# Patient Record
Sex: Female | Born: 1945
Health system: Southern US, Community
[De-identification: ages and names within clinical notes are randomized; demographics above are authoritative.]

## PROBLEM LIST (undated history)

## (undated) ENCOUNTER — Ambulatory Visit: Discharge status: Absent without leave | Payer: PPO

## (undated) DIAGNOSIS — M549 Dorsalgia, unspecified: Secondary | ICD-10-CM

## (undated) DIAGNOSIS — M797 Fibromyalgia: Secondary | ICD-10-CM

## (undated) DIAGNOSIS — J329 Chronic sinusitis, unspecified: Secondary | ICD-10-CM

## (undated) DIAGNOSIS — E785 Hyperlipidemia, unspecified: Secondary | ICD-10-CM

## (undated) DIAGNOSIS — F419 Anxiety disorder, unspecified: Secondary | ICD-10-CM

## (undated) DIAGNOSIS — G629 Polyneuropathy, unspecified: Secondary | ICD-10-CM

## (undated) DIAGNOSIS — E039 Hypothyroidism, unspecified: Secondary | ICD-10-CM

## (undated) DIAGNOSIS — I639 Cerebral infarction, unspecified: Secondary | ICD-10-CM

## (undated) HISTORY — DX: Hyperlipidemia, unspecified: E78.5

## (undated) HISTORY — PX: CATARACT EXTRACTION: SUR2

## (undated) HISTORY — PX: ABDOMINAL HYSTERECTOMY: SHX81

## (undated) HISTORY — PX: CHOLECYSTECTOMY: SHX55

## (undated) HISTORY — DX: Chronic sinusitis, unspecified: J32.9

## (undated) HISTORY — DX: Dorsalgia, unspecified: M54.9

## (undated) HISTORY — DX: Anxiety disorder, unspecified: F41.9

## (undated) HISTORY — DX: Fibromyalgia: M79.7

---

## 1999-09-28 ENCOUNTER — Encounter: Payer: Self-pay | Admitting: Obstetrics and Gynecology

## 2000-03-14 ENCOUNTER — Encounter: Payer: Self-pay | Admitting: Neurosurgery

## 2000-03-14 ENCOUNTER — Encounter: Admission: RE | Admit: 2000-03-14 | Discharge: 2000-03-14 | Payer: Self-pay | Admitting: Neurosurgery

## 2000-03-23 ENCOUNTER — Ambulatory Visit (HOSPITAL_COMMUNITY): Admission: RE | Admit: 2000-03-23 | Discharge: 2000-03-23 | Payer: Self-pay | Admitting: Neurosurgery

## 2000-03-24 ENCOUNTER — Ambulatory Visit (HOSPITAL_COMMUNITY): Admission: RE | Admit: 2000-03-24 | Discharge: 2000-03-24 | Payer: Self-pay | Admitting: Neurosurgery

## 2000-03-24 ENCOUNTER — Encounter: Payer: Self-pay | Admitting: Neurosurgery

## 2001-01-18 ENCOUNTER — Encounter: Payer: Self-pay | Admitting: Obstetrics and Gynecology

## 2001-01-18 ENCOUNTER — Encounter: Admission: RE | Admit: 2001-01-18 | Discharge: 2001-01-18 | Payer: Self-pay | Admitting: Obstetrics and Gynecology

## 2001-03-02 ENCOUNTER — Ambulatory Visit (HOSPITAL_COMMUNITY): Admission: RE | Admit: 2001-03-02 | Discharge: 2001-03-02 | Payer: Self-pay | Admitting: Preventative Medicine

## 2001-03-02 ENCOUNTER — Encounter: Payer: Self-pay | Admitting: Preventative Medicine

## 2001-05-15 ENCOUNTER — Encounter: Payer: Self-pay | Admitting: Urology

## 2001-05-15 ENCOUNTER — Encounter: Admission: RE | Admit: 2001-05-15 | Discharge: 2001-05-15 | Payer: Self-pay | Admitting: Urology

## 2001-05-29 ENCOUNTER — Encounter: Payer: Self-pay | Admitting: Urology

## 2001-05-29 ENCOUNTER — Encounter: Admission: RE | Admit: 2001-05-29 | Discharge: 2001-05-29 | Payer: Self-pay | Admitting: Urology

## 2001-05-30 ENCOUNTER — Encounter: Payer: Self-pay | Admitting: Urology

## 2001-06-01 ENCOUNTER — Ambulatory Visit (HOSPITAL_BASED_OUTPATIENT_CLINIC_OR_DEPARTMENT_OTHER): Admission: RE | Admit: 2001-06-01 | Discharge: 2001-06-01 | Payer: Self-pay | Admitting: Urology

## 2001-06-01 ENCOUNTER — Encounter (INDEPENDENT_AMBULATORY_CARE_PROVIDER_SITE_OTHER): Payer: Self-pay

## 2001-07-27 ENCOUNTER — Emergency Department (HOSPITAL_COMMUNITY): Admission: EM | Admit: 2001-07-27 | Discharge: 2001-07-28 | Payer: Self-pay | Admitting: Emergency Medicine

## 2002-02-27 ENCOUNTER — Ambulatory Visit (HOSPITAL_BASED_OUTPATIENT_CLINIC_OR_DEPARTMENT_OTHER): Admission: RE | Admit: 2002-02-27 | Discharge: 2002-02-27 | Payer: Self-pay | Admitting: Orthopedic Surgery

## 2002-11-16 ENCOUNTER — Ambulatory Visit (HOSPITAL_COMMUNITY): Admission: RE | Admit: 2002-11-16 | Discharge: 2002-11-16 | Payer: Self-pay | Admitting: General Surgery

## 2002-11-16 ENCOUNTER — Encounter: Payer: Self-pay | Admitting: General Surgery

## 2003-03-06 ENCOUNTER — Emergency Department (HOSPITAL_COMMUNITY): Admission: EM | Admit: 2003-03-06 | Discharge: 2003-03-07 | Payer: Self-pay | Admitting: Emergency Medicine

## 2003-03-06 ENCOUNTER — Encounter: Payer: Self-pay | Admitting: Emergency Medicine

## 2004-03-13 ENCOUNTER — Ambulatory Visit (HOSPITAL_COMMUNITY): Admission: RE | Admit: 2004-03-13 | Discharge: 2004-03-13 | Payer: Self-pay | Admitting: General Surgery

## 2004-04-07 ENCOUNTER — Observation Stay (HOSPITAL_COMMUNITY): Admission: RE | Admit: 2004-04-07 | Discharge: 2004-04-08 | Payer: Self-pay | Admitting: General Surgery

## 2004-07-09 ENCOUNTER — Ambulatory Visit (HOSPITAL_COMMUNITY): Admission: RE | Admit: 2004-07-09 | Discharge: 2004-07-09 | Payer: Self-pay | Admitting: General Surgery

## 2004-10-23 ENCOUNTER — Emergency Department (HOSPITAL_COMMUNITY): Admission: EM | Admit: 2004-10-23 | Discharge: 2004-10-24 | Payer: Self-pay | Admitting: Emergency Medicine

## 2005-07-14 ENCOUNTER — Ambulatory Visit (HOSPITAL_COMMUNITY): Admission: RE | Admit: 2005-07-14 | Discharge: 2005-07-14 | Payer: Self-pay | Admitting: General Surgery

## 2005-10-02 ENCOUNTER — Emergency Department (HOSPITAL_COMMUNITY): Admission: EM | Admit: 2005-10-02 | Discharge: 2005-10-02 | Payer: Self-pay | Admitting: Emergency Medicine

## 2005-10-11 ENCOUNTER — Ambulatory Visit (HOSPITAL_COMMUNITY): Admission: RE | Admit: 2005-10-11 | Discharge: 2005-10-11 | Payer: Self-pay | Admitting: General Surgery

## 2006-02-11 ENCOUNTER — Ambulatory Visit: Payer: Self-pay | Admitting: Internal Medicine

## 2006-02-21 ENCOUNTER — Ambulatory Visit: Payer: Self-pay | Admitting: Internal Medicine

## 2006-02-22 ENCOUNTER — Ambulatory Visit: Payer: Self-pay | Admitting: Internal Medicine

## 2006-02-23 ENCOUNTER — Ambulatory Visit: Payer: Self-pay | Admitting: Gastroenterology

## 2006-02-23 ENCOUNTER — Observation Stay (HOSPITAL_COMMUNITY): Admission: EM | Admit: 2006-02-23 | Discharge: 2006-02-26 | Payer: Self-pay | Admitting: Emergency Medicine

## 2006-02-28 ENCOUNTER — Ambulatory Visit: Payer: Self-pay | Admitting: Internal Medicine

## 2006-03-07 ENCOUNTER — Ambulatory Visit: Payer: Self-pay | Admitting: Internal Medicine

## 2006-03-11 ENCOUNTER — Ambulatory Visit: Payer: Self-pay | Admitting: Internal Medicine

## 2006-03-18 ENCOUNTER — Ambulatory Visit: Payer: Self-pay | Admitting: Internal Medicine

## 2006-04-01 ENCOUNTER — Ambulatory Visit: Payer: Self-pay | Admitting: Internal Medicine

## 2006-04-11 ENCOUNTER — Ambulatory Visit: Payer: Self-pay | Admitting: Internal Medicine

## 2007-03-09 ENCOUNTER — Ambulatory Visit: Payer: Self-pay | Admitting: Gastroenterology

## 2007-03-15 ENCOUNTER — Encounter (HOSPITAL_COMMUNITY): Admission: RE | Admit: 2007-03-15 | Discharge: 2007-04-14 | Payer: Self-pay | Admitting: Gastroenterology

## 2007-03-24 ENCOUNTER — Ambulatory Visit: Payer: Self-pay | Admitting: Gastroenterology

## 2007-03-24 ENCOUNTER — Ambulatory Visit (HOSPITAL_COMMUNITY): Admission: RE | Admit: 2007-03-24 | Discharge: 2007-03-24 | Payer: Self-pay | Admitting: Gastroenterology

## 2007-03-24 ENCOUNTER — Encounter: Payer: Self-pay | Admitting: Gastroenterology

## 2007-06-26 ENCOUNTER — Ambulatory Visit (HOSPITAL_COMMUNITY)
Admission: RE | Admit: 2007-06-26 | Discharge: 2007-06-26 | Payer: Self-pay | Admitting: Physical Medicine and Rehabilitation

## 2007-09-13 ENCOUNTER — Emergency Department (HOSPITAL_COMMUNITY): Admission: EM | Admit: 2007-09-13 | Discharge: 2007-09-13 | Payer: Self-pay | Admitting: Emergency Medicine

## 2007-11-16 ENCOUNTER — Ambulatory Visit (HOSPITAL_COMMUNITY): Payer: Self-pay | Admitting: Psychiatry

## 2007-12-04 ENCOUNTER — Ambulatory Visit (HOSPITAL_COMMUNITY): Payer: Self-pay | Admitting: Psychiatry

## 2007-12-12 ENCOUNTER — Ambulatory Visit (HOSPITAL_COMMUNITY): Payer: Self-pay | Admitting: Psychiatry

## 2007-12-21 ENCOUNTER — Ambulatory Visit (HOSPITAL_COMMUNITY): Payer: Self-pay | Admitting: Psychiatry

## 2008-01-23 ENCOUNTER — Ambulatory Visit (HOSPITAL_COMMUNITY): Payer: Self-pay | Admitting: Psychiatry

## 2008-02-06 ENCOUNTER — Ambulatory Visit (HOSPITAL_COMMUNITY): Payer: Self-pay | Admitting: Psychiatry

## 2008-03-07 ENCOUNTER — Ambulatory Visit (HOSPITAL_COMMUNITY): Payer: Self-pay | Admitting: Psychiatry

## 2008-04-25 ENCOUNTER — Ambulatory Visit (HOSPITAL_COMMUNITY): Payer: Self-pay | Admitting: Psychiatry

## 2008-06-25 ENCOUNTER — Ambulatory Visit (HOSPITAL_COMMUNITY): Payer: Self-pay | Admitting: Psychiatry

## 2008-08-20 ENCOUNTER — Ambulatory Visit (HOSPITAL_COMMUNITY): Payer: Self-pay | Admitting: Psychiatry

## 2008-09-03 ENCOUNTER — Ambulatory Visit (HOSPITAL_COMMUNITY): Payer: Self-pay | Admitting: Psychiatry

## 2008-10-11 ENCOUNTER — Ambulatory Visit (HOSPITAL_COMMUNITY): Payer: Self-pay | Admitting: Psychiatry

## 2008-11-12 ENCOUNTER — Ambulatory Visit (HOSPITAL_COMMUNITY): Payer: Self-pay | Admitting: Psychiatry

## 2009-01-07 ENCOUNTER — Ambulatory Visit (HOSPITAL_COMMUNITY): Payer: Self-pay | Admitting: Psychiatry

## 2009-04-22 ENCOUNTER — Ambulatory Visit (HOSPITAL_COMMUNITY): Payer: Self-pay | Admitting: Psychiatry

## 2009-08-12 ENCOUNTER — Ambulatory Visit (HOSPITAL_COMMUNITY): Payer: Self-pay | Admitting: Psychiatry

## 2009-11-13 ENCOUNTER — Ambulatory Visit (HOSPITAL_COMMUNITY): Payer: Self-pay | Admitting: Psychiatry

## 2010-02-06 ENCOUNTER — Ambulatory Visit (HOSPITAL_COMMUNITY)
Admission: RE | Admit: 2010-02-06 | Discharge: 2010-02-06 | Payer: Self-pay | Source: Home / Self Care | Admitting: Internal Medicine

## 2010-02-12 ENCOUNTER — Ambulatory Visit (HOSPITAL_COMMUNITY): Payer: Self-pay | Admitting: Psychiatry

## 2010-05-14 ENCOUNTER — Ambulatory Visit (HOSPITAL_COMMUNITY): Payer: Self-pay | Admitting: Psychiatry

## 2010-06-03 ENCOUNTER — Ambulatory Visit (HOSPITAL_COMMUNITY)
Admission: RE | Admit: 2010-06-03 | Discharge: 2010-06-03 | Payer: Self-pay | Source: Home / Self Care | Attending: Internal Medicine | Admitting: Internal Medicine

## 2010-06-05 ENCOUNTER — Ambulatory Visit (HOSPITAL_COMMUNITY)
Admission: RE | Admit: 2010-06-05 | Discharge: 2010-06-05 | Payer: Self-pay | Source: Home / Self Care | Attending: Internal Medicine | Admitting: Internal Medicine

## 2010-07-05 ENCOUNTER — Encounter: Payer: Self-pay | Admitting: Preventative Medicine

## 2010-08-06 ENCOUNTER — Encounter (INDEPENDENT_AMBULATORY_CARE_PROVIDER_SITE_OTHER): Payer: Medicare Other | Admitting: Psychiatry

## 2010-08-06 DIAGNOSIS — F411 Generalized anxiety disorder: Secondary | ICD-10-CM

## 2010-09-03 ENCOUNTER — Encounter (HOSPITAL_COMMUNITY): Payer: Medicare Other | Admitting: Psychiatry

## 2010-09-22 ENCOUNTER — Encounter (INDEPENDENT_AMBULATORY_CARE_PROVIDER_SITE_OTHER): Payer: Medicare Other | Admitting: Psychiatry

## 2010-09-22 DIAGNOSIS — F411 Generalized anxiety disorder: Secondary | ICD-10-CM

## 2010-10-07 ENCOUNTER — Other Ambulatory Visit (INDEPENDENT_AMBULATORY_CARE_PROVIDER_SITE_OTHER): Payer: Self-pay | Admitting: Otolaryngology

## 2010-10-07 DIAGNOSIS — R42 Dizziness and giddiness: Secondary | ICD-10-CM

## 2010-10-12 ENCOUNTER — Ambulatory Visit (HOSPITAL_COMMUNITY)
Admission: RE | Admit: 2010-10-12 | Discharge: 2010-10-12 | Disposition: A | Discharge status: Home / Self Care | Payer: Medicare Other | Source: Ambulatory Visit | Attending: Otolaryngology | Admitting: Otolaryngology

## 2010-10-12 DIAGNOSIS — R42 Dizziness and giddiness: Secondary | ICD-10-CM | POA: Insufficient documentation

## 2010-10-12 DIAGNOSIS — J328 Other chronic sinusitis: Secondary | ICD-10-CM | POA: Insufficient documentation

## 2010-10-12 MED ORDER — GADOBENATE DIMEGLUMINE 529 MG/ML IV SOLN
10.0000 mL | Freq: Once | INTRAVENOUS | Status: AC | PRN
  Administered 2010-10-12: 10 mL via INTRAVENOUS

## 2010-10-15 ENCOUNTER — Encounter (HOSPITAL_COMMUNITY): Payer: Self-pay | Admitting: Psychiatry

## 2010-10-20 ENCOUNTER — Encounter (INDEPENDENT_AMBULATORY_CARE_PROVIDER_SITE_OTHER): Payer: Medicare Other | Admitting: Psychiatry

## 2010-10-20 DIAGNOSIS — F411 Generalized anxiety disorder: Secondary | ICD-10-CM

## 2010-10-27 NOTE — Assessment & Plan Note (Signed)
NAMEMARLAYSIA, [name]                CHART#:  [id number]   DATE:  03/09/2007                       DOB:  11-Oct-1945   PROBLEM LIST:  1. Abdominal pain and diarrhea.  2. Fibromyalgia.  3. Migraines.  4. Cholecystectomy.  5. Appendectomy.  6. Hysterectomy.   SUBJECTIVE:  [name] is a 65 year old female who is seen as a return  patient visit.  She was seen as an inpatient consult in September 2007.  She has not been seen in clinic in the interval.  She complains of  cramping, bloating, and nausea.  She had these symptoms for 20 years,  but the nausea is new.  She denies any vomiting.  She does consume milk  products regularly, such as ice cream and milk on her cereal.  She began  to have heartburn every day a few weeks ago.  We questioned what did she  mean by heartburn, she means that food is bubbling up in her esophagus  for the last year.  She has had a 17 pound increase in weight in 2  months.  She is constipated secondary to her medications.  The morphine  was added approximately 3 months ago.  She tried to go without Reglan,  but Reglan works better for her nausea than Phenergan.  She denies any  blood in her stools.  No food has been coming out through her nose.  She  has 2 to 3 bowel movements a day.  It can be anywhere from watery to  soft and normal.  When she was having difficulty with her constipation,  she nearly passed out.  She has tried Levsin in the past without any  symptomatic relief.  She had an EGD over 5 years ago, and was unable to  get asleep.  She had a colonoscopy in 2001, which showed no polyps.   MEDICATIONS:  1. Tylenol as needed.  2. Aleve as needed.  3. MiraLax daily.  4. Crestor 10 mg daily.  5. Zolpidem 10 mg nightly.  6. Avinza 60 mg daily.  7. Premarin 0.3 mg daily.  8. Lorazepam 1 mg 3 times a day as needed.  9. Reglan 5 mg morning and evening.  10.Elavil 12.5 t.i.d.  11.Morphine 10 mg daily.   OBJECTIVE:  Weight 127 pounds.  Height 5 feet  3 inches.  Temperature  98.6.  Blood pressure 130/86.  Pulse 80.  GENERAL:  She is in no apparent distress.  Alert and oriented x4.  HEENT EXAM:  Atraumatic and normocephalic.  Pupils equal and reactive to  light.  MOUTH:  No oral lesions.  Posterior pharynx without erythema or exudate.  NECK:  Full range of motion with no lymphadenopathy.  LUNGS:  Clear to auscultation bilaterally.  CARDIOVASCULAR EXAM:  Regular rhythm.  No murmur.  Normal S1 and S2.  ABDOMEN:  Bowel sounds are present.  Soft.  Nontender.  Non-distended.  No rebound or guarding.  EXTREMITIES:  Without edema.  NEUROLOGIC:  She has no focal neurologic deficits.   ASSESSMENT:  [name] is a 65 year old female who has longstanding  history of irritable bowel syndrome causing abdominal pain and diarrhea.  She does consume lactose containing products.  Her symptoms may be  exacerbated by lactose intolerance.   Thank you for allowing me to see [name] in consultation.  My  recommendations follow.   RECOMMENDATIONS:  1. She is asked to follow a lactose free diet.  She was given her      discharge instructions in writing.  2. We will schedule a gastric emptying study as soon as possible to      evaluate bloating and nausea.  If her emptying is delayed, then she      will need to have her Reglan increased.  Her emptying could be      impaired by the addition of morphine.  3. If after 7 days she does not see any significant relief from      avoiding lactose products, she should also begin a gas and      flatulence prevention diet.  4. She is to add a probiotic until her next visit.  She was given      samples of Align.  She may substitute other probiotics.  5. She has a return patient visit to see me in 2 months.       Kassie Mends, M.D.  Electronically Signed     SM/MEDQ  D:  03/09/2007  T:  03/10/2007  Job:  [id number]   cc:   Catalina Pizza, M.D.

## 2010-10-27 NOTE — Op Note (Signed)
[name], [name]               ACCOUNT NO.:  [id number]   MEDICAL RECORD NO.:  [id number]          PATIENT TYPE:  AMB   LOCATION:  DAY                           FACILITY:  [facility]   PHYSICIAN:  Kassie Mends, M.D.      DATE OF BIRTH:  [birth year]   DATE OF PROCEDURE:  03/24/2007  DATE OF DISCHARGE:                               OPERATIVE REPORT   PROCEDURE:  Esophagogastroduodenoscopy with cold forceps biopsy.   INDICATION FOR EXAM:  [name] is a 65 year old female who has a history  of cramping, bloating, and nausea.  She had a gastric emptying study on  March 15, 2007 which showed delayed gastric emptying.  She was not  taking her Reglan at the time.  She is chronically maintained on  morphine.  Her upper endoscopy is being performed, today, to evaluate  new onset dyspepsia in a 65 year old female  who has delayed gastric  emptying.   FINDINGS:  1. Diffuse erythema of the antrum without erosion or ulceration.      Biopsies obtained via cold forceps to evaluate for H. pylori      gastritis.  2. Small hiatal hernia (1 cm).  3. Normal esophagus without evidence of Barrett's, stricture, mass or      erosion, or ulceration.  4. Normal duodenal bulb, and second portion of the duodenum.   RECOMMENDATIONS:  1. Will call [name] with the results of her biopsies.  She has no      evidence of H. pylori gastritis, then may consider increasing her      Reglan dose.  2. She should resume her Reglan at 5 mg in the morning and in the      evening.  3. She should avoid gastric irritants.  She is given a handout on      gastroparesis diet, gastric irritants, and gastritis.  4. No aspirin, NSAIDs, or anticoagulation for 5 days.  5. She should have a followup appointment to see me in November of      2008.   MEDICATIONS:  1. Demerol 100 mg IV.  2. Versed 6 mg IV.  3. Phenergan 25 mg IV.   PROCEDURE TECHNIQUE:  Physical exam was performed, and an informed  consent was obtained from the  patient after explaining the benefits,  risks, and alternatives to the procedure.  The patient was connected to  the monitor, and placed in the left lateral position.  Continuous oxygen  was provided by nasal cannula and IV medicine administered through an  indwelling cannula.  After administration of sedation, the patient's  esophagus was  intubated, and the scope was advanced under direct visualization to the  second portion of the duodenum.  The scope was removed slowly by  carefully examining the color, texture, anatomy, and integrity of the  mucosa on the way out.  The patient was recovered in endoscopy and  discharged home in satisfactory condition.      Kassie Mends, M.D.  Electronically Signed     SM/MEDQ  D:  03/24/2007  T:  03/25/2007  Job:  [id number]  cc:   Catalina Pizza, M.D.  Fax: [phone]

## 2010-10-29 ENCOUNTER — Ambulatory Visit (INDEPENDENT_AMBULATORY_CARE_PROVIDER_SITE_OTHER): Payer: Medicare Other | Admitting: Otolaryngology

## 2010-10-29 DIAGNOSIS — R42 Dizziness and giddiness: Secondary | ICD-10-CM

## 2010-10-29 DIAGNOSIS — J322 Chronic ethmoidal sinusitis: Secondary | ICD-10-CM

## 2010-10-29 DIAGNOSIS — H6980 Other specified disorders of Eustachian tube, unspecified ear: Secondary | ICD-10-CM

## 2010-10-29 DIAGNOSIS — J320 Chronic maxillary sinusitis: Secondary | ICD-10-CM

## 2010-10-30 NOTE — Op Note (Signed)
NAMEBASSHEVA, FLURY               ACCOUNT NO.:  [id number]   MEDICAL RECORD NO.:  [id number]          PATIENT TYPE:  AMB   LOCATION:  DAY                           FACILITY:  [facility]   PHYSICIAN:  Jerolyn Shin C. Katrinka Blazing, M.D.   DATE OF BIRTH:  [birth year]   DATE OF PROCEDURE:  04/07/2004  DATE OF DISCHARGE:                                 OPERATIVE REPORT   PREOPERATIVE DIAGNOSES:  Cholelithiasis, cholecystitis.   POSTOPERATIVE DIAGNOSES:  Cholelithiasis, cholecystitis.   PROCEDURE:  Laparoscopic cholecystectomy.   SURGEON:  Dirk Dress. Katrinka Blazing, M.D.   DESCRIPTION OF PROCEDURE:  Under general anesthesia, the patient's abdomen  was prepped and draped in a sterile field.  A supraumbilical incision was  made and Veress needle was inserted uneventfully.  Abdomen was insufflated  with 2 L of CO2.  Using a Visiport guide, a 10 mm port was placed  uneventfully.  Laparoscope was placed.  I tried to visualize the pelvis but  because of dense adhesions in the pelvis, the ovaries could not be seen.  The patient was positioned in reverse Trendelenburg position.  Under  videoscopic guidance, a 10 mm port and two 5 mm ports were placed in the  right subcostal region.  The gallbladder was grasped and positioned.  There  were two cystic artery branches.  Each was clipped with four clips and  divided.  Cystic duct was dissected back to the gallbladder.  It was clipped  with five clips and divided.  The gallbladder was separated from the  intrahepatic space with electrocautery without difficulty.  Gallbladder was  placed in an EndoCatch device and retrieved.  There was minimal blood loss  from the bed.  There was no bile leak.  Irrigation was carried out until the  fluid returned clear.  The bed was reinspected; no abnormality was noted.  The patient tolerated the procedure well.  CO2 was allowed to escape from  the abdomen, and the ports were removed.  The incision in the supraumbilical  midline was closed with 0  Dexon.  The skin incisions were closed with  staples.  The patient tolerated the procedure well.  Dressings were placed.  She was awakened from anesthesia uneventfully, transferred to a bed, and  taken to the postanesthesia care unit for further monitoring.     Lero   LCS/MEDQ  D:  04/07/2004  T:  04/07/2004  Job:  [id number]

## 2010-10-30 NOTE — Consult Note (Signed)
NAMEREI, CONTEE               ACCOUNT NO.:  [id number]   MEDICAL RECORD NO.:  [id number]          PATIENT TYPE:  INP   LOCATION:  A308                          FACILITY:  [facility]   PHYSICIAN:  Kassie Mends, M.D.      DATE OF BIRTH:  February 19, 1946   DATE OF CONSULTATION:  02/24/2006  DATE OF DISCHARGE:                                   CONSULTATION   REASON FOR CONSULTATION:  Abdominal pain and diarrhea.   HISTORY OF PRESENT ILLNESS:  [name] is a 65 year old female who has a  significant past medical history of irritable bowel syndrome.  She reports  episodic abdominal pain, vomiting, and diarrhea for the last 1 1/2 years.  In 2001, she reports having a colonoscopy by Dr. Katrinka Blazing for what she reports  what he believed was bloody stools.  She reports it was really vaginal  bleeding.  Her colonoscopy was normal.  She states 1 1/2 years ago, she had  the sudden onset of vomiting followed by slimy stool which required  hospitalization at [facility].  She was admitted, reports having  stool samples, x-rays, and no clear diagnosis was made.  She got a shot of  something and she got better.  She had the same thing approximately three  weeks ago and it lasted for 2-3 days.  She did not go to the hospital.  This  third time, she came to our emergency department because she was lying down  on Sunday, she got hot and cold, then had nausea and then the onset of  vomiting and diarrhea at the same time.  It was watery brown diarrhea which  was different than the first two episodes.  She initially had an episode of  emesis which was non-bloody and yellow.  She ate some Jello and after a  couple of hours, she had repeated episodes of vomiting and diarrhea and came  to the hospital.  She denies any blood in her stool.  She states her husband  has similar symptoms chronically.  She denies any fever.  She has subjective  chills.  She denies any dysuria or hematuria and has chronic back pain.  Now, her vomiting is resolved.  She still reports having a bowel movement  every 15 minutes.  At home, she goes at least 2-3 times a day.   PAST MEDICAL HISTORY:  1. Fibromyalgia.  2. Migraines.  3. Chronic diarrhea.  4. Cervical spine problems.   PAST SURGICAL HISTORY:  1. Hysterectomy.  2. Appendectomy.  3. Cholecystectomy for vomiting.   ALLERGIES:  CODEINE.   MEDICATIONS:  1. At home:      a.     Cymbalta.      b.     Lunesta.      c.     Reglan.  2. In hospital:      a.     Cymbalta.      b.     Lovenox.      c.     Reglan.      d.     Protonix.  e.     Desyrel.   FAMILY HISTORY:  She has no family history of colon cancer or colon polyps.   REVIEW OF SYSTEMS:  She denies any heartburn but complains of indigestion.  She describes her indigestion as drinking something and it comes back up if  she lies down.  She denies any difficulty swallowing and reports a 2 pound  weight loss since the acute onset of her symptoms.  Review of systems is per  the HPI, otherwise, all systems are negative.   PHYSICAL EXAMINATION:  VITAL SIGNS:  Temperature 98.9, systolic blood pressure 158-144, bowel  movement x3 recorded.  GENERAL:  She is in no apparent distress, alert and oriented x4.  HEENT:  Normocephalic, atraumatic, pupils equal and reactive to light, mouth  with no oral lesions.  Posterior pharynx without erythema or exudate.  NECK:  Full range of motion, no lymphadenopathy.  LUNGS:  Clear to auscultation bilaterally.  CARDIOVASCULAR EXAM:  Regular rhythm, no murmur, normal S1 and S2.  ABDOMEN:  Bowel sounds are present, soft, nontender, nondistended, no  rebound or guarding.  EXTREMITIES:  No cyanosis, clubbing, and edema.  NEUROLOGICAL:  She has no focal neurological deficits.   LABORATORY DATA:  White count 6.5, hemoglobin 11.1, platelets 156, pulse 69,  BUN 2, creatinine 0.6, B12 1376, albumin 3.9, ALT 25, ALT 26, total bili  0.5.  Pending C. diff stool culture,  O&P, and WBCs.   ASSESSMENT:  Ms. Focht is a 65 year old female with acute onset of vomiting  and diarrhea with a CT scan that shows small bowel enteritis, likely viral  and a low likelihood of bacterial enteritis.  She has chronic GI symptoms  likely secondary to a functional gut disorder.   Thank you for allowing me to see Ms. Kallen in consultation.  My  recommendations to follow.   RECOMMENDATIONS:  1. Discontinue Reglan in a patient who has a tendency to have depression.      I would use Phenergan and Zofran as needed for nausea and vomiting      unless she has a definite evidence of gastroparesis.  2. I would add Fibercon 2 pills twice a day and change to a lactose free      high fiber diet.  3. She may follow up with me one month post discharge.  4. Use Kaopectate after loose stools until the pending stool studies are      finalized.  5. Will reassess in the a.m.      Kassie Mends, M.D.  Electronically Signed    SM/MEDQ  D:  02/24/2006  T:  02/25/2006  Job:  [id number]

## 2010-10-30 NOTE — H&P (Signed)
NAMESHAILEE, [name]               ACCOUNT NO.:  [id number]   MEDICAL RECORD NO.:  [id number]          PATIENT TYPE:  AMB   LOCATION:  DAY                           FACILITY:  [facility]   PHYSICIAN:  Jerolyn Shin C. Katrinka Blazing, M.D.   DATE OF BIRTH:  [birth year]   DATE OF ADMISSION:  DATE OF DISCHARGE:  LH                                HISTORY & PHYSICAL   HISTORY OF PRESENT ILLNESS:  [name] is a 65 year old female with  history of recurrent abdominal pain and nausea on a daily basis, and  increasing bloating with worsening  postprandial discomfort.  She has had  episodes of nausea and vomiting.  She has been hospitalized in the past for  these symptoms.  She has been increasing symptomatic since August.  She has  had recurrent upper abdominal tenderness since that time.  She had an  ultrasound don, which showed sludge and small stones in the gallbladder.  The patient has clinical and ultrasonographic evidence of chronic  cholecystitis and is scheduled for laparoscopic cholecystectomy.   PAST HISTORY:  1.  The patient has chronic severe headaches.  2.  Chronic low-back pain.  3.  Depression.  4.  Fibromyalgia.   MEDICATIONS:  1.  Limbitrol D.S 1 PO  ___________ t.i.d.  2.  Wellbutrin 100 mg b.i.d.  3.  Esgic Plus 2 q.i.d.  4.  Premarin 1.25 mg daily.  5.  Hydrocodone 5/500 q.i.d.   PAST SURGICAL HISTORY:  Abdominoplasty.   SOCIAL HISTORY:  She is married.  Medically disabled.  She smokes 1.5 packs  of cigarettes per day.  She does not drink or use drugs.   REVIEW OF SYSTEMS:  Review of systems is positive for diffuse soft tissue  pain of her extremities and trunk.  She has chronic insomnia, chronic  headaches and worsening anxiety with depression.   PHYSICAL EXAMINATION:  GENERAL APPEARANCE:  On examination she is a small-  frame female in no acute distress.  VITAL SIGNS:  Blood pressure 100/60, pulse 88, respirations 18 and weight  114 pounds.  HEENT:  Unremarkable.  NECK:  The  neck is supple.  No JVD or bruits.  CHEST:  Chest is clear to auscultation.  HEART:  Regular rate and rhythm without murmur, gallop or rub.  ABDOMEN:  Mild epigastric and right upper quadrant tenderness.  No masses.  Normoactive bowel sounds.  EXTREMITIES:  Cyanosis, clubbing or edema.  NEUROLOGIC EXAMINATION:  No focal motor, sensory or cerebellar deficits.   IMPRESSION:  1.  Chronic cholecystitis with cholelithiasis.  2.  Chronic migraine headaches.  3.  Chronic low-back pain.  4.  Fibromyalgia.   PLAN:  Laparoscopic cholecystectomy.     Lero   LCS/MEDQ  D:  04/07/2004  T:  04/07/2004  Job:  [id number]

## 2010-10-30 NOTE — H&P (Signed)
[name], [name]               ACCOUNT NO.:  [id number]   MEDICAL RECORD NO.:  [id number]          PATIENT TYPE:  INP   LOCATION:  A308                          FACILITY:  [facility]   PHYSICIAN:  Osvaldo Shipper, MD     DATE OF BIRTH:  1946/02/20   DATE OF ADMISSION:  02/23/2006  DATE OF DISCHARGE:  09/15/2007LH                                HISTORY & PHYSICAL   PRIMARY CARE PHYSICIAN:  Dr. Harle Stanford.   ADMITTING DIAGNOSES:  1. Acute gastroenteritis.  2. Abdominal pain of unclear etiology.  3. Chronic diarrhea and chronic abdominal pain.  4. Possible irritable bowel syndrome.  5. History of arthritis.  6. History of migraine headaches.  7. History of cervical spine arthritis which is chronic.   CHIEF COMPLAINT:  Vomiting and diarrhea for the past 3 days.   HISTORY OF PRESENT ILLNESS:  The patient is a 65 year old Caucasian female  who has history of migraine headaches, arthritis, fibromyalgias, irritable  bowel syndrome, per the patient has history of diarrhea which dates back  more than 5 years ago.  The patient was on a lot of narcotic agents and she  is currently being detoxed, as she states, by her current PMD.  The patient  mentions that her recent episodes of vomiting and diarrhea started about 3  days ago.  She describes more than 10 bowel movements a day which is  yellowish in color, denies any blood in the stool.  The BM is really watery.  She has had about 2 since she has been here in the ED.  She also describes  multiple episodes of vomiting, more than 20 per day, with no blood in the  emesis.  Her emesis is described as yellowish and then becoming greenish.  She also complains of some abdominal pain which started after she started  throwing up and she describes it as more as a soreness rather than pain  itself.  She does have history of acid reflux and she is currently  experiencing mild regurgitation.  She states she has lost about 3-4 pounds  over the past few  months but her weight has been stable over the past few  years.  Denies any recent antibiotic use.  She said she has not been able to  take orally well for the past few days.  She also complains of insomnia for  the past 2 weeks.   Currently she is feeling a little bit better since she has been given some  anti-emetics in the ED.   MEDICATIONS AT HOME:  She said that she has been taken off of all of her  medications by her current PMD and she is only currently on Cymbalta and  Lunesta and Reglan.   ALLERGIES:  CODEINE.   PAST MEDICAL HISTORY:  Arthritis, fibromyalgia, migraine headaches,  irritable bowel syndrome, cervical spine problems as well dating back to a  long time, history of chronic diarrhea for the past year.  She said she had  an upper endoscopy done more than 10 years ago, and she said she had a  colonoscopy about  a year and a half ago at [facility].  She does not  know the results of the same.  The patient is status post hysterectomy,  cholecystectomy in the past.  She has had plastic surgery to her abdomen in  the past.  She also had a appendix removal along with her hysterectomy.   SOCIAL HISTORY:  Lives in [place], lives with her mother-in-law who is  bedridden and her husband who is on disability.  Smokes 1/2 pack of  cigarettes per day.  She is declining a nicotine patch at this time.  No  alcohol use, no illicit drug use.   FAMILY HISTORY:  Father died of non-Hodgkin's lymphoma, mother had diabetes  and a heart condition.  She has also passed away.  There is history of  diabetes among other family members as well.   REVIEW OF SYSTEMS:  Otherwise unremarkable except as mentioned in the HPI.   PHYSICAL EXAMINATION:  Vital signs: Temperature is 98.2, blood pressure  132/78, heart rate in the 90s, respiratory rate is 18.  Saturation is 100%  on room air.  NECK:  Soft and supple.  There is some tenderness with some range of motion  but there is no  instability that is appreciated.  LUNGS:  Clear to auscultation bilaterally.  CARDIOVASCULAR:  S1, S2, rate is regular, no murmurs appreciated.  ABDOMEN:  Reveals scars from previous surgery, otherwise it is quite soft.  There is mild tenderness diffusely with no rebound, rigidity or guarding.  Bowel sounds are present.  No mass or organomegaly appreciated.  Minimal  tenderness over the epigastric area where it is more than the other areas  EXTREMITIES:  Without edema.  Peripheral pulses are palpable.   LABORATORY DATA:  Her CBC reveals MCV of 101.8 with a normal hemoglobin,  platelet count is 196.  CMET reveals potassium of 3.2, glucose 147, other  values normal.  Lipase is normal.  Urinalysis shows small bilirubin,  ketones, small blood with no evidence of infection.   No imaging studies done.   IMPRESSION:  This is a 65 year old Caucasian female who has medical problems  as discussed earlier, who has chronic abdominal symptoms of diarrhea and  pain which has been evaluated in the past and no clear etiology has been  found.  Most likely this patient has irritable bowel syndrome causing most  of her problems.  However she is experiencing some increased emesis over the  past few days and as a result she has not been able to take p.o. intake.  The patient might have mild gastroenteritis resulting in the majority of her  symptoms.  She also has hypokalemia.  She also has abdominal pain which is  most likely related probably to gastritis component of the gastroenteritis.   PLAN:  1. GI.  We will observe the patient overnight, give her symptomatic      treatments with anti-emetics.  We will check stool studies and give her      Imodium for her diarrhea, continue Reglan for now.  Review of her      records suggests that she has not had imaging studies of her abdomen      done in a long time and we will obtain a CT scan of her abdomen and     pelvis to rule out any acute intra-abdominal  process.  We will start      her on clear liquids and advance as tolerated.  GI cocktail and PPI  shall be given.  GI consult will be considered if we really need one.      Otherwise, I think the patient will benefit from an outpatient GI      consultation.   1. Macrocytosis.  We will check a TSH and a B12 and folate level on this      individual.   1. Other medical problems including arthritis, migraines, fibromyalgia are      all stable.  I have told the patient emphatically that I am not      prescribing her any narcotic agents unless there is a clear indication      for the same.  The patient understands this completely.  She tells me      that she is currently being detoxed from these agents anyway.   1. DVT prophylaxis will be given.   I anticipate the patient going home in the next day once her acute symptoms  have been controlled and if her CT scan is unremarkable.  In the meantime we  will also try to obtain the report of her colonoscopy from [facility].      Osvaldo Shipper, MD  Electronically Signed     GK/MEDQ  D:  02/23/2006  T:  02/23/2006  Job:  [id number]   cc:   Valla Leaver, Ph.D.

## 2010-10-30 NOTE — Op Note (Signed)
NAME:  [name], Shalisha A                         ACCOUNT NO.:  [id number]   MEDICAL RECORD NO.:  [id number]                   PATIENT TYPE:  AMB   LOCATION:  DSC                                  FACILITY:  [facility]   PHYSICIAN:  Sherri Rad, M.D.               DATE OF BIRTH:  [birth year]   DATE OF PROCEDURE:  02/27/2002  DATE OF DISCHARGE:                                 OPERATIVE REPORT   PREOPERATIVE DIAGNOSIS:  Left dorsomedial great toe interphalangeal joint  degenerative cyst.   POSTOPERATIVE DIAGNOSIS:  Left dorsomedial great toe interphalangeal joint  degenerative cyst.   OPERATION:  Partial excision, left great toe proximal and distal phalanx.   ANESTHESIA:  General endotracheal tube.   SURGEON:  Sherri Rad, M.D.   ASSISTANT:  Shawna Orleans, PA   ESTIMATED BLOOD LOSS:  Minimal.   TOURNIQUET TIME:  Eighteen minutes.   COMPLICATIONS:  None.   DISPOSITION:  Stable to the PAR.   INDICATIONS FOR SURGERY:  This is a 65year-old female who has had  persistent dorsomedial left IP joint pain in the great toe for about three  years.  It has progressive; however, the size of the cyst has not changed.  It has been interfering with her life to the point where she cannot do what  she wants to do.  She has consented to the above procedure.  All risks  including infection, neurovascular injury, persistent pain, worsening of  pain, and recurrence were all explained.  Questions were encouraged and  answered.   DESCRIPTION OF OPERATION:  The patient was brought to the operating room and  placed in the supine position.  After adequate general endotracheal tube  anesthesia was administered as well as Ancef 1 gram IV piggyback the left  lower extremity was then prepped and draped in a sterile manner with a  proximally placed thigh tourniquet.  A longitudinal incision over the cyst  was then made.  Dissection was carried down to the cyst.  The soft tissues  were elevated  both proximally and distally.  Synovitis in the area was  debrided with a rongeur as well as the bone on either side of the IP joint  involving the proximal and distal phalanx on the dorsomedial corner of the  IP  joint.  Once this was done the wound was copiously irrigated with normal  saline.  The skin was closed with 4-0 nylon after tourniquet was deflated.  A sterile dressing was applied.   The patient was stable to the PAR with a hard sole shoe.                                                  Sherri Rad, M.D.    PAB/MEDQ  D:  02/27/2002  T:  02/27/2002  Job:  [id number]

## 2010-10-30 NOTE — Op Note (Signed)
[facility]  Patient:    [name], [name] Visit Number: [id number] MRN: [id number]          Service Type: NES Location: NESC Attending Physician:  Laqueta Jean Dictated by:   Vonzell Schlatter Patsi Sears, M.D. Proc. Date: 06/01/01 Admit Date:  06/01/2001                             Operative Report  PREOPERATIVE DIAGNOSIS:  Interstitial cystitis.  POSTOPERATIVE DIAGNOSIS:  Interstitial cystitis.  OPERATION:  Cystourethroscopy, urethral dilation, hydrodistention of the bladder, bladder biopsy, insertion of Pyridium and Marcaine, injection of Marcaine and Kenalog.  SURGEON:  Sigmund I. Patsi Sears, M.D.  ANESTHESIA:  General.  PREPARATION:  After appropriate preanesthesia, the patient was brought to the operating room and placed on the operating table in the dorsal supine position where general anesthesia was introduced.  She was then replaced in the low Allen stirrup dorsolithotomy position where the pubis was prepped with Betadine solution and draped in the usual fashion.  DESCRIPTION OF PROCEDURE:  The urethra was dilated to a size 36-French with the female urethral dilators.  Following this, cystoscopy was accomplished, and hydrodistention was accomplished from 350 cc to 400 cc.  Definite bleeding was noted from classic Hunner ulceration of interstitial cystitis, and these areas were biopsied.  Following this, the patient underwent insertion of Marcaine and Pyridium in the bladder, and injection of Marcaine and Kenalog via a long spinal needle, into the bladder base, and periurethral area.  Xylocaine jelly was placed in the urethra.  B&O suppository was given at the beginning of the case, and Toradol was given at the end of the procedure.  The patient tolerated the procedure well, was awakened, and taken to the recovery room in good condition. Dictated by:   Vonzell Schlatter Patsi Sears, M.D. Attending Physician:  Laqueta Jean DD:   06/01/01 TD:  06/02/01 Job: [id number]

## 2010-10-30 NOTE — Discharge Summary (Signed)
NAMETENAE, GRAZIOSI               ACCOUNT NO.:  [id number]   MEDICAL RECORD NO.:  [id number]          PATIENT TYPE:  INP   LOCATION:  A308                          FACILITY:  [facility]   PHYSICIAN:  Margaretmary Dys, M.D.DATE OF BIRTH:  [birth year]   DATE OF ADMISSION:  02/23/2006  DATE OF DISCHARGE:  09/15/2007LH                                 DISCHARGE SUMMARY   DISCHARGE DIAGNOSIS:  1. Acute gastroenteritis.  2. Acute abdominal pain likely related to number 1 above.  3. Chronic diarrhea and chronic abdominal pain.  4. Irritable bowel syndrome.  5. History of migraine headaches.   MEDICATIONS:  1. Cymbalta.  2. Lunesta.  3. Reglan as needed.   DISCHARGE INSTRUCTIONS:  Diet regular.  Activity is as tolerated.   CONSULTATIONS:  GI consultation with Dr. Kassie Mends who recommended lactose  free high fiber diet and to follow up with her in one month.   FOLLOW UP:  1. Dr. Kassie Mends in one month.  2. Primary care physician in 3-4 weeks.   PERTINENT LABORATORY DATA:  On admission, white blood cell count normal with  MCV 101.8, normal hemoglobin, platelet count 196.  Potassium 3.2, glucose  147, otherwise normal.  Lipase normal.   HOSPITAL COURSE:  Ms. Jernberg is a 65 year old Caucasian female who has a  history of migraine headaches, arthritis, fibromyalgias, irritable bowel  syndrome and intermittent diarrhea over the past five years.  She has been  on narcotic agents in the past and has been detox'd.  The patient reports  having several episodes of vomiting and diarrhea three days prior to  admission.  She describes having 10 bowel movements a day which were  yellowish in color.  She denies any blood.  She denies any fevers or chills.  She has also had multiple episodes of vomiting, in excess of 20, which she  describes as yellowish and then greenish.  She does have a history of acid  reflux and was having some regurgitation problems.  She had extensive  evaluation including  stool studies during the course of hospitalization.  She was hydrated with fluids.  However, as we proceeded with her  hospitalization, it appeared that it was entirely due to gastroenteritis.  A  CT scan of the abdomen and pelvis suggested evidence of enteritis.  The  patient was subsequently managed conservatively and when I saw her on  February 26, 2006, she was doing much better and was subsequently  discharged home in satisfactory condition.   DISPOSITION:  To home.      Margaretmary Dys, M.D.  Electronically Signed     AM/MEDQ  D:  03/03/2006  T:  03/03/2006  Job:  [id number]   cc:   Valla Leaver, Ph.D.

## 2010-11-03 ENCOUNTER — Encounter (HOSPITAL_COMMUNITY): Payer: Medicare Other | Admitting: Psychiatry

## 2010-12-08 ENCOUNTER — Encounter (INDEPENDENT_AMBULATORY_CARE_PROVIDER_SITE_OTHER): Payer: No Typology Code available for payment source | Admitting: Psychiatry

## 2010-12-08 DIAGNOSIS — F411 Generalized anxiety disorder: Secondary | ICD-10-CM

## 2011-01-07 ENCOUNTER — Encounter (INDEPENDENT_AMBULATORY_CARE_PROVIDER_SITE_OTHER): Payer: No Typology Code available for payment source | Admitting: Psychiatry

## 2011-01-07 DIAGNOSIS — F411 Generalized anxiety disorder: Secondary | ICD-10-CM

## 2011-01-21 ENCOUNTER — Encounter (INDEPENDENT_AMBULATORY_CARE_PROVIDER_SITE_OTHER): Payer: No Typology Code available for payment source | Admitting: Psychiatry

## 2011-01-21 DIAGNOSIS — F411 Generalized anxiety disorder: Secondary | ICD-10-CM

## 2011-01-28 ENCOUNTER — Encounter (HOSPITAL_COMMUNITY): Payer: No Typology Code available for payment source | Admitting: Psychiatry

## 2011-02-02 ENCOUNTER — Encounter (HOSPITAL_COMMUNITY): Payer: No Typology Code available for payment source | Admitting: Psychiatry

## 2011-02-18 ENCOUNTER — Ambulatory Visit (HOSPITAL_COMMUNITY)
Admission: RE | Admit: 2011-02-18 | Discharge: 2011-02-18 | Disposition: A | Discharge status: Home / Self Care | Payer: Medicare Other | Source: Ambulatory Visit | Attending: Internal Medicine | Admitting: Internal Medicine

## 2011-02-18 ENCOUNTER — Other Ambulatory Visit (HOSPITAL_COMMUNITY): Payer: Self-pay | Admitting: Internal Medicine

## 2011-02-18 DIAGNOSIS — R05 Cough: Secondary | ICD-10-CM

## 2011-02-18 DIAGNOSIS — R059 Cough, unspecified: Secondary | ICD-10-CM | POA: Insufficient documentation

## 2011-02-18 DIAGNOSIS — R0602 Shortness of breath: Secondary | ICD-10-CM | POA: Insufficient documentation

## 2011-02-25 ENCOUNTER — Ambulatory Visit (INDEPENDENT_AMBULATORY_CARE_PROVIDER_SITE_OTHER): Payer: No Typology Code available for payment source | Admitting: Psychiatry

## 2011-02-25 DIAGNOSIS — F411 Generalized anxiety disorder: Secondary | ICD-10-CM

## 2011-03-09 LAB — BASIC METABOLIC PANEL
BUN: 11
CO2: 21
Creatinine, Ser: 0.88
GFR calc Af Amer: >60
GFR calc non Af Amer: >60
Potassium: 3.2 — ABNORMAL LOW
Sodium: 138

## 2011-03-09 LAB — URINALYSIS, ROUTINE W REFLEX MICROSCOPIC
Bilirubin Urine: NEGATIVE
Glucose, UA: NEGATIVE
Protein, ur: NEGATIVE
Specific Gravity, Urine: 1.025
Urobilinogen, UA: 0.2

## 2011-03-09 LAB — URINE MICROSCOPIC-ADD ON

## 2011-03-09 LAB — RAPID URINE DRUG SCREEN, HOSP PERFORMED
Barbiturates: NOT DETECTED
Benzodiazepines: POSITIVE — AB

## 2011-03-09 LAB — DIFFERENTIAL
Basophils Absolute: 0.1
Eosinophils Absolute: 0
Lymphocytes Relative: 19
Neutrophils Relative %: 73

## 2011-03-09 LAB — CBC
HCT: 42.2
Platelets: 347
RBC: 4.32
RDW: 12.7

## 2011-03-25 ENCOUNTER — Encounter (INDEPENDENT_AMBULATORY_CARE_PROVIDER_SITE_OTHER): Payer: No Typology Code available for payment source | Admitting: Psychiatry

## 2011-03-25 DIAGNOSIS — F411 Generalized anxiety disorder: Secondary | ICD-10-CM

## 2011-04-15 ENCOUNTER — Encounter (INDEPENDENT_AMBULATORY_CARE_PROVIDER_SITE_OTHER): Payer: No Typology Code available for payment source | Admitting: Psychiatry

## 2011-04-15 DIAGNOSIS — F411 Generalized anxiety disorder: Secondary | ICD-10-CM

## 2011-05-13 ENCOUNTER — Encounter (HOSPITAL_COMMUNITY): Payer: No Typology Code available for payment source | Admitting: Psychiatry

## 2011-05-13 ENCOUNTER — Ambulatory Visit (INDEPENDENT_AMBULATORY_CARE_PROVIDER_SITE_OTHER): Payer: No Typology Code available for payment source | Admitting: Psychiatry

## 2011-05-13 ENCOUNTER — Encounter (HOSPITAL_COMMUNITY): Payer: Self-pay | Admitting: Psychiatry

## 2011-05-13 DIAGNOSIS — F411 Generalized anxiety disorder: Secondary | ICD-10-CM

## 2011-05-13 DIAGNOSIS — F419 Anxiety disorder, unspecified: Secondary | ICD-10-CM

## 2011-05-13 MED ORDER — ZOLPIDEM TARTRATE 10 MG PO TABS: 10.0000 mg | ORAL_TABLET | Freq: Every evening | ORAL | Status: DC | PRN

## 2011-05-13 MED ORDER — SERTRALINE HCL 100 MG PO TABS: 100.0000 mg | ORAL_TABLET | Freq: Every day | ORAL | Status: DC

## 2011-05-13 MED ORDER — VENLAFAXINE HCL 75 MG PO TABS: 75.0000 mg | ORAL_TABLET | Freq: Two times a day (BID) | ORAL | Status: DC

## 2011-05-13 NOTE — Progress Notes (Signed)
Patient came for her followup appointment with her husband. She's been doing much better with the Effexor. She is taking now Effexor 75 mg twice a day and Zoloft 100 mg daily. She reported no severe panic attack or anxiety attack in past few weeks. She had a good Thanksgiving and she is looking forward to have a good Christmas with her sister and their children. Husband also endorse much improvement with Effexor. She reported increased energy increased concentration and has been more involved in her daily life. 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 She feels great when she help that lady. Overall she has been compliant in doing very well on current medication. She reported no side effects of medication. She denies any agitation anger or mood swings. Her sleep is better. And her affect is improved.  Mental status emanation Patient is well groomed and well dressed. She maintained good eye contact. Her speech is soft clear and coherent. She described her mood is good and her affect is improved. She denies any active or passive suicidal thinking or homicidal thinking. She denies any auditory or visual hallucination. There no psychotic symptoms present. She's alert and oriented x3. Her insight judgment and pulse control okay.  Assessment Anxiety disorder NOS rule out Maj. depressive disorder  Plan We will continue her medications Zoloft 100 mg daily, Effexor 75 mg twice a day and Ambien 10 mg at bedtime. I have explained risks and benefits of medication and recommended to call us if she has any question. I will see her again in 2 months

## 2011-05-24 ENCOUNTER — Other Ambulatory Visit (HOSPITAL_COMMUNITY): Payer: Self-pay | Admitting: Psychiatry

## 2011-06-11 ENCOUNTER — Other Ambulatory Visit (HOSPITAL_COMMUNITY): Payer: Self-pay | Admitting: *Deleted

## 2011-06-14 ENCOUNTER — Other Ambulatory Visit (HOSPITAL_COMMUNITY): Payer: Self-pay | Admitting: *Deleted

## 2011-06-14 ENCOUNTER — Other Ambulatory Visit (HOSPITAL_COMMUNITY): Payer: Self-pay | Admitting: Psychiatry

## 2011-06-14 MED ORDER — ZOLPIDEM TARTRATE 10 MG PO TABS: 10.0000 mg | ORAL_TABLET | Freq: Every evening | ORAL | Status: DC | PRN

## 2011-06-14 MED ORDER — CLONAZEPAM 1 MG PO TABS: 1.0000 mg | ORAL_TABLET | Freq: Three times a day (TID) | ORAL | Status: DC

## 2011-06-16 NOTE — Telephone Encounter (Signed)
Ambien and Klonopin called to preferred pharmacy 06/14/11 by Dr. Lucianne Muss.

## 2011-07-13 ENCOUNTER — Ambulatory Visit (INDEPENDENT_AMBULATORY_CARE_PROVIDER_SITE_OTHER): Payer: No Typology Code available for payment source | Admitting: Psychiatry

## 2011-07-13 DIAGNOSIS — F419 Anxiety disorder, unspecified: Secondary | ICD-10-CM

## 2011-07-13 DIAGNOSIS — F411 Generalized anxiety disorder: Secondary | ICD-10-CM

## 2011-07-13 DIAGNOSIS — Z79899 Other long term (current) drug therapy: Secondary | ICD-10-CM

## 2011-07-13 LAB — COMPREHENSIVE METABOLIC PANEL
AST: 12 U/L (ref 0–37)
Albumin: 4.4 g/dL (ref 3.5–5.2)
Alkaline Phosphatase: 51 U/L (ref 39–117)
BUN: 10 mg/dL (ref 6–23)
Calcium: 9.5 mg/dL (ref 8.4–10.5)
Chloride: 105 mEq/L (ref 96–112)
Glucose, Bld: 69 mg/dL — ABNORMAL LOW (ref 70–99)
Potassium: 4.2 mEq/L (ref 3.5–5.3)
Sodium: 140 mEq/L (ref 135–145)
Total Protein: 6.7 g/dL (ref 6.0–8.3)

## 2011-07-13 LAB — CBC WITH DIFFERENTIAL/PLATELET
Basophils Absolute: 0.1 10*3/uL (ref 0.0–0.1)
Basophils Relative: 1 % (ref 0–1)
Eosinophils Absolute: 0.1 10*3/uL (ref 0.0–0.7)
Hemoglobin: 12.6 g/dL (ref 12.0–15.0)
MCH: 32.3 pg (ref 26.0–34.0)
MCHC: 33.1 g/dL (ref 30.0–36.0)
Monocytes Relative: 12 % (ref 3–12)
Neutro Abs: 3.6 10*3/uL (ref 1.7–7.7)
Neutrophils Relative %: 56 % (ref 43–77)
Platelets: 266 10*3/uL (ref 150–400)

## 2011-07-13 LAB — HEMOGLOBIN A1C: Hgb A1c MFr Bld: 5.7 % — ABNORMAL HIGH (ref <5.7)

## 2011-07-13 MED ORDER — ZOLPIDEM TARTRATE 10 MG PO TABS: 10.0000 mg | ORAL_TABLET | Freq: Every evening | ORAL | Status: DC | PRN

## 2011-07-13 MED ORDER — CLONAZEPAM 1 MG PO TABS: 1.0000 mg | ORAL_TABLET | Freq: Three times a day (TID) | ORAL | Status: DC

## 2011-07-13 MED ORDER — VENLAFAXINE HCL 75 MG PO TABS: 75.0000 mg | ORAL_TABLET | Freq: Two times a day (BID) | ORAL | Status: DC

## 2011-07-13 MED ORDER — SERTRALINE HCL 100 MG PO TABS: 100.0000 mg | ORAL_TABLET | Freq: Every day | ORAL | Status: DC

## 2011-07-13 NOTE — Progress Notes (Signed)
Patient came for her followup appointment with her husband. She continues to have episodic nervousness and panic attack but overall her anxiety is well controlled. She does not want to to stop Zoloft as she feels combination of Zoloft and Effexor working very well. She still takes Klonopin 3 times a day and Ambien at bedtime. She had a good holidays as she was able to meet her sister. She denies any agitation anger or mood swings. She reported no side effects of medication. She is sleeping fine and there has been no change in her appetite or weight.  Mental status emanation Patient is well groomed and well dressed. She maintained good eye contact. Her speech is soft clear and coherent. Her thought process is logical linear and goal-directed. She described her mood is good and her affect is improved. She denies any active or passive suicidal thinking or homicidal thinking. She denies any auditory or visual hallucination. There no psychotic symptoms present. She's alert and oriented x3. Her insight judgment and pulse control okay.  Assessment Anxiety disorder NOS rule out Maj. depressive disorder  Plan We will continue her medications Zoloft 100 mg daily, she is reporting no side effects of medication. I will also continue Effexor 75 mg twice a day and Ambien 10 mg at bedtime. I will get blood work including CBC CMP and hemoglobin A1c. I have explained risks and benefits of medication and recommended to call us if she has any question. I will see her again in 2 months

## 2011-09-09 ENCOUNTER — Ambulatory Visit (HOSPITAL_COMMUNITY): Payer: No Typology Code available for payment source | Admitting: Psychiatry

## 2011-09-09 ENCOUNTER — Other Ambulatory Visit (HOSPITAL_COMMUNITY): Payer: Self-pay | Admitting: *Deleted

## 2011-09-09 DIAGNOSIS — F419 Anxiety disorder, unspecified: Secondary | ICD-10-CM

## 2011-09-09 MED ORDER — SERTRALINE HCL 100 MG PO TABS: 100.0000 mg | ORAL_TABLET | Freq: Every day | ORAL | Status: DC

## 2011-09-09 MED ORDER — ZOLPIDEM TARTRATE 10 MG PO TABS: 10.0000 mg | ORAL_TABLET | Freq: Every evening | ORAL | Status: DC | PRN

## 2011-09-09 MED ORDER — CLONAZEPAM 1 MG PO TABS: 1.0000 mg | ORAL_TABLET | Freq: Three times a day (TID) | ORAL | Status: DC

## 2011-09-09 MED ORDER — VENLAFAXINE HCL 75 MG PO TABS: 75.0000 mg | ORAL_TABLET | Freq: Two times a day (BID) | ORAL | Status: DC

## 2011-10-05 ENCOUNTER — Encounter (HOSPITAL_COMMUNITY): Payer: Self-pay | Admitting: Psychiatry

## 2011-10-05 ENCOUNTER — Ambulatory Visit (INDEPENDENT_AMBULATORY_CARE_PROVIDER_SITE_OTHER): Payer: No Typology Code available for payment source | Admitting: Psychiatry

## 2011-10-05 VITALS — Wt 130.0 lb

## 2011-10-05 DIAGNOSIS — F32A Depression, unspecified: Secondary | ICD-10-CM | POA: Insufficient documentation

## 2011-10-05 DIAGNOSIS — F419 Anxiety disorder, unspecified: Secondary | ICD-10-CM | POA: Insufficient documentation

## 2011-10-05 DIAGNOSIS — F411 Generalized anxiety disorder: Secondary | ICD-10-CM

## 2011-10-05 MED ORDER — SERTRALINE HCL 100 MG PO TABS: 100.0000 mg | ORAL_TABLET | Freq: Every day | ORAL | Status: DC

## 2011-10-05 MED ORDER — CLONAZEPAM 1 MG PO TABS: 1.0000 mg | ORAL_TABLET | Freq: Three times a day (TID) | ORAL | Status: DC

## 2011-10-05 MED ORDER — VENLAFAXINE HCL 75 MG PO TABS: 75.0000 mg | ORAL_TABLET | Freq: Two times a day (BID) | ORAL | Status: DC

## 2011-10-05 NOTE — Progress Notes (Addendum)
Chief complaint I'm very anxious because my pain doctor told me that he will cut down my pain medicine.    History of present illness Patient is 66 year old married unemployed female who came with her husband for followup appointment.  She is very concerned and anxious as her pain doctor told that he will cut down oxycodone and next visit and eventually it will be discontinued.  Patient is unclear pain doctor will replace with other pain medication.  She is very anxious and nervous.  She does not have any other issues however she feel that she cannot deal her pain without medication.  She does not go outside due to anxiety and pain.  She is taking Effexor Zoloft and Klonopin as prescribed.  She likes these medication.  She denies any active or passive suicidal thinking or homicidal thinking.  She does not abuse her Klonopin or ask for early refill.  She does not drink alcohol or any illegal substance.  She denies any agitation anger or mood swings.  Her husband endorse that overall her depression is much control with the medication.  She denies any shakes tremors or any side effects.  I reviewed her blood test results which was done on her last visit.  She has normal liver enzymes and kidney function test.  Her hemoglobin A1c is 5.7.  Current psychiatric medication Effexor 75 mg twice a day Zoloft 100 mg daily Klonopin 1 mg 3 times a day and forth as needed  She also takes Ambien prescribed by her primary care physician.  Medical history Patient has history of hyperlipidemia, sinusitis, seasonal allergies, chronic back pain and fibromyalgia.  She sees Dr. Modesta Messing who prescribed pain medication .  She takes Flexeril, oxycodone from him.  Social history Patient is unemployed.  She lives with her husband.  She is very attached to her daughter.  Mental status emanation Patient is well groomed and well dressed. She maintained good eye contact.  She is calm pleasant and cooperative.  Her speech is soft  clear and coherent. Her thought process is logical linear and goal-directed. She described her mood is anxious and her affect is mood congruent.  She denies any active or passive suicidal thinking or homicidal thinking. She denies any auditory or visual hallucination. There no psychotic symptoms present. She's alert and oriented x3. Her insight judgment and pulse control okay.  Assessment Axis I Anxiety disorder NOS  Axis II deferred Axis III see medical history Axis IV mild to moderate  Axis V 60-65  Plan I discussed in detail about her management of pain .  I told her to discuss with her pain doctor in detail as stopping control medication may cause withdrawal symptoms.  Patient has history of significant withdrawal symptoms when she was taking benzodiazepine few years ago.  I encourage her to talk to her pain doctor about switching to not control pain medication.  At this time patient is tolerating her psychiatric medication without any side effects.  Lab results discussed in detail with the patient.  Encouraged her to monitor her diet , calorie intake and try to do exercise .  I recommended to call us if she is a question or concern about the medication or if she feels worsening of her symptoms.  I will see her again in 8 weeks. Time spent 30 minutes

## 2011-10-07 ENCOUNTER — Other Ambulatory Visit (HOSPITAL_COMMUNITY): Payer: Self-pay | Admitting: Psychiatry

## 2011-10-07 ENCOUNTER — Telehealth (HOSPITAL_COMMUNITY): Payer: Self-pay | Admitting: *Deleted

## 2011-10-09 ENCOUNTER — Other Ambulatory Visit (HOSPITAL_COMMUNITY): Payer: Self-pay | Admitting: Psychiatry

## 2011-10-10 ENCOUNTER — Other Ambulatory Visit (HOSPITAL_COMMUNITY): Payer: Self-pay | Admitting: Psychiatry

## 2011-10-10 NOTE — Telephone Encounter (Signed)
Given hard copy on 10/05/11

## 2011-10-12 ENCOUNTER — Other Ambulatory Visit (HOSPITAL_COMMUNITY): Payer: Self-pay | Admitting: *Deleted

## 2011-10-12 DIAGNOSIS — F419 Anxiety disorder, unspecified: Secondary | ICD-10-CM

## 2011-10-12 MED ORDER — ZOLPIDEM TARTRATE 10 MG PO TABS: 10.0000 mg | ORAL_TABLET | Freq: Every evening | ORAL | Status: DC | PRN

## 2011-11-09 ENCOUNTER — Other Ambulatory Visit (HOSPITAL_COMMUNITY): Payer: Self-pay | Admitting: Psychiatry

## 2011-11-09 ENCOUNTER — Telehealth (HOSPITAL_COMMUNITY): Payer: Self-pay | Admitting: *Deleted

## 2011-11-09 NOTE — Telephone Encounter (Signed)
Never prescribed by this office. Contact primary care physcian

## 2011-11-09 NOTE — Telephone Encounter (Signed)
Patient called complaining of pain and infection.  Her pain Dr. has cut down the pain medication.  She is having withdrawal symptoms.  I recommend to contact her primary care physician and pain management to address this issue.

## 2011-11-15 ENCOUNTER — Other Ambulatory Visit (HOSPITAL_COMMUNITY): Payer: Self-pay | Admitting: Psychiatry

## 2011-11-15 DIAGNOSIS — F411 Generalized anxiety disorder: Secondary | ICD-10-CM

## 2011-11-29 ENCOUNTER — Other Ambulatory Visit: Payer: Self-pay | Admitting: Neurosurgery

## 2011-11-29 ENCOUNTER — Ambulatory Visit
Admission: RE | Admit: 2011-11-29 | Discharge: 2011-11-29 | Disposition: A | Discharge status: Home / Self Care | Payer: Medicare Other | Source: Ambulatory Visit | Attending: Neurosurgery | Admitting: Neurosurgery

## 2011-11-29 DIAGNOSIS — M545 Low back pain: Secondary | ICD-10-CM

## 2011-11-30 ENCOUNTER — Ambulatory Visit: Payer: Self-pay | Admitting: Family Medicine

## 2011-12-02 ENCOUNTER — Ambulatory Visit (INDEPENDENT_AMBULATORY_CARE_PROVIDER_SITE_OTHER): Payer: Self-pay | Admitting: Psychiatry

## 2011-12-02 ENCOUNTER — Encounter (HOSPITAL_COMMUNITY): Payer: Self-pay | Admitting: Psychiatry

## 2011-12-02 VITALS — Wt 126.0 lb

## 2011-12-02 DIAGNOSIS — F411 Generalized anxiety disorder: Secondary | ICD-10-CM

## 2011-12-02 DIAGNOSIS — F419 Anxiety disorder, unspecified: Secondary | ICD-10-CM

## 2011-12-02 MED ORDER — ZOLPIDEM TARTRATE 10 MG PO TABS: 10.0000 mg | ORAL_TABLET | Freq: Every day | ORAL | Status: DC

## 2011-12-02 MED ORDER — VENLAFAXINE HCL 75 MG PO TABS: 75.0000 mg | ORAL_TABLET | Freq: Two times a day (BID) | ORAL | Status: DC

## 2011-12-02 MED ORDER — SERTRALINE HCL 100 MG PO TABS: 100.0000 mg | ORAL_TABLET | Freq: Every day | ORAL | Status: DC

## 2011-12-02 MED ORDER — CLONAZEPAM 1 MG PO TABS: 1.0000 mg | ORAL_TABLET | Freq: Three times a day (TID) | ORAL | Status: DC

## 2011-12-02 NOTE — Progress Notes (Signed)
Chief complaint I still hurting.  I'm not taking any pain medication.  I scheduled to see pain Dr. tomorrow.    History of present illness Patient is 66 year old married unemployed female who came with her husband for followup appointment.  She is very concerned and anxious as her pain.  Her oxycodone and Flexeril has been is stopped by Dr. Modesta Messing.  She is scheduled to see Dr. Thyra Breed for her pain management tomorrow.  She admitted not sleeping very well due to pain despite taking Klonopin and Ambien..  Overall her depression is better on 2 antidepressant.  She is tolerating her medication without any side effects.  She denies any tremors or shakes.  Recently she does not have any dizziness or vertigo.  She denies any agitation anger mood swing.  She denies any crying spells however endorse social isolation and withdrawn due to pain.  She's not drinking or using any illegal substance. She has blood work done in January 2013 .  She has normal liver enzymes and kidney function test.  Her hemoglobin A1c is 5.7.  Current psychiatric medication Effexor 75 mg twice a day Zoloft 100 mg daily Klonopin 1 mg 3 times a day and forth as needed  Ambien 10 mg at bedtime.    Medical history Patient has history of hyperlipidemia, sinusitis, seasonal allergies, chronic back pain and fibromyalgia.    Social history Patient is unemployed.  She lives with her husband.  She is very attached to her daughter.  Mental status emanation Patient is well groomed and well dressed. She maintained fair eye contact.  She is anxious but cooperative.  Her speech is soft and clear .  Her thought process is logical linear and goal-directed.  She described her mood is anxious and her affect is constricted.  She denies any active or passive suicidal thoughts or homicidal thoughts.  She denies any auditory or visual hallucination.  There were no delusion or psychosis present.  Her attention and concentration is fair.  She's  oriented x3.  Her insight judgment and pulse control is okay.  Assessment Axis I Anxiety disorder NOS  Axis II deferred Axis III see medical history Axis IV mild to moderate  Axis V 60-65  Plan I will continue her current psychiatric medication.  I encourage her to keep appointment with the pain Dr. tomorrow for her pain management.  I discussed in detail the risks and benefits of medication.  Patient is tolerating her psychiatric medication without any side effects.  She is not abusing her benzodiazepine or asking for early refills.  I recommend to call us if she is any question or concern of the medication or if she feels worsening of the symptoms.  I will see her again in 2 months.  Portion of this note is generated with voice recognition software and may contain typographical error.

## 2012-02-01 ENCOUNTER — Encounter (HOSPITAL_COMMUNITY): Payer: Self-pay | Admitting: Psychiatry

## 2012-02-01 ENCOUNTER — Ambulatory Visit (INDEPENDENT_AMBULATORY_CARE_PROVIDER_SITE_OTHER): Payer: Medicare Other | Admitting: Psychiatry

## 2012-02-01 VITALS — BP 110/80 | HR 80 | Wt 124.0 lb

## 2012-02-01 DIAGNOSIS — F419 Anxiety disorder, unspecified: Secondary | ICD-10-CM

## 2012-02-01 DIAGNOSIS — F411 Generalized anxiety disorder: Secondary | ICD-10-CM

## 2012-02-01 MED ORDER — SERTRALINE HCL 100 MG PO TABS: 100.0000 mg | ORAL_TABLET | Freq: Every day | ORAL | Status: DC

## 2012-02-01 MED ORDER — ZOLPIDEM TARTRATE 10 MG PO TABS: 10.0000 mg | ORAL_TABLET | Freq: Every day | ORAL | Status: DC

## 2012-02-01 MED ORDER — CLONAZEPAM 1 MG PO TABS: 1.0000 mg | ORAL_TABLET | Freq: Three times a day (TID) | ORAL | Status: DC

## 2012-02-01 NOTE — Progress Notes (Signed)
Chief complaint I feel better since and taking her pain medication.  I stopped taking Effexor I like Zoloft Klonopin and Ambien.      History of present illness Patient is 66 year old married unemployed female who came with her husband for followup appointment.  She is taking pain medication from Dr. Lowella Curb in [place].  She tried initially or panic and oxycodone however she felt better with Percocet.  She still feel back pain on occasion but overall her pain level is much better.  She sleeping better.  She denies any recent panic attack or agitation.  She stopped taking Effexor as she felt that she does not need 2 antidepressant.  Her family is also doing well.  Her 71 year-old grandson will start school and she is excited about.  She likes her current psychiatric medication.  She sleep better with Ambien.  She denies any recent crying spells or any active or passive suicidal thoughts.  She's not drinking or using any illegal substance.  She still smokes however she cut down her cigarette smoking and she only smokes 6-7 cigarettes a day. She has blood work done in January 2013 .  She has normal liver enzymes and kidney function test.  Her hemoglobin A1c is 5.7.  Current psychiatric medication Zoloft 100 mg daily Klonopin 1 mg 3 times a day and forth as needed  Ambien 10 mg at bedtime.    Medical history Patient has history of hyperlipidemia, sinusitis, seasonal allergies, chronic back pain and fibromyalgia.    Social history Patient is unemployed.  She lives with her husband.  She is very attached to her daughter.  Mental status emanation Patient is well groomed and well dressed. She maintained fair eye contact.  She is pleasant and cooperative.  Her speech is soft and clear .  Her thought process is logical linear and goal-directed.  She described her mood is good and her affect is bright from the past.  She denies any active or passive suicidal thoughts or homicidal thoughts.  She denies any  auditory or visual hallucination.  There were no delusion or psychosis present.  Her attention and concentration is fair.  She's oriented x3.  Her insight judgment and pulse control is okay.  Assessment Axis I Anxiety disorder NOS  Axis II deferred Axis III see medical history Axis IV mild to moderate  Axis V 60-65  Plan I reviewed chart, last progress note, medication and vitals.  She is taking Percocet for her pain.  Her pain is much better from the past.  Patient does not abuse her benzodiazepine.  Her anxiety and depression is much improved since she is taking pain medication.  She's not taking 2 antidepressant at this time.  She also cut down some smoking.  I discussed the risks and benefits of medication.  I recommend to call us if she is any question or concern about the medication.  She will require Ambien Klonopin and Zoloft.  Counseling done for smoking cessation.  Time spent 30 minutes.  I will see her again in 2 months.  Portion of this note is generated with voice recognition software and may contain typographical error.

## 2012-03-24 ENCOUNTER — Emergency Department (HOSPITAL_COMMUNITY): Payer: Medicare Other

## 2012-03-24 ENCOUNTER — Emergency Department (HOSPITAL_COMMUNITY)
Admission: EM | Admit: 2012-03-24 | Discharge: 2012-03-24 | Disposition: A | Discharge status: Home / Self Care | Payer: Medicare Other | Attending: Emergency Medicine | Admitting: Emergency Medicine

## 2012-03-24 ENCOUNTER — Encounter (HOSPITAL_COMMUNITY): Payer: Self-pay | Admitting: *Deleted

## 2012-03-24 DIAGNOSIS — Z79899 Other long term (current) drug therapy: Secondary | ICD-10-CM | POA: Insufficient documentation

## 2012-03-24 DIAGNOSIS — R071 Chest pain on breathing: Secondary | ICD-10-CM | POA: Insufficient documentation

## 2012-03-24 DIAGNOSIS — IMO0001 Reserved for inherently not codable concepts without codable children: Secondary | ICD-10-CM | POA: Insufficient documentation

## 2012-03-24 DIAGNOSIS — E785 Hyperlipidemia, unspecified: Secondary | ICD-10-CM | POA: Insufficient documentation

## 2012-03-24 DIAGNOSIS — Z8673 Personal history of transient ischemic attack (TIA), and cerebral infarction without residual deficits: Secondary | ICD-10-CM | POA: Insufficient documentation

## 2012-03-24 DIAGNOSIS — R0781 Pleurodynia: Secondary | ICD-10-CM

## 2012-03-24 DIAGNOSIS — R0602 Shortness of breath: Secondary | ICD-10-CM | POA: Insufficient documentation

## 2012-03-24 HISTORY — DX: Cerebral infarction, unspecified: I63.9

## 2012-03-24 LAB — CBC WITH DIFFERENTIAL/PLATELET
Basophils Relative: 1 % (ref 0–1)
Hemoglobin: 12.9 g/dL (ref 12.0–15.0)
Lymphs Abs: 1.5 10*3/uL (ref 0.7–4.0)
MCHC: 34 g/dL (ref 30.0–36.0)
Monocytes Relative: 8 % (ref 3–12)
Neutro Abs: 6.9 10*3/uL (ref 1.7–7.7)
Neutrophils Relative %: 74 % (ref 43–77)
Platelets: 259 10*3/uL (ref 150–400)
RBC: 3.83 MIL/uL — ABNORMAL LOW (ref 3.87–5.11)

## 2012-03-24 LAB — BASIC METABOLIC PANEL
BUN: 11 mg/dL (ref 6–23)
Chloride: 104 mEq/L (ref 96–112)
GFR calc Af Amer: >90 mL/min (ref >90)
GFR calc non Af Amer: 86 mL/min — ABNORMAL LOW (ref >90)
Potassium: 3.9 mEq/L (ref 3.5–5.1)
Sodium: 138 mEq/L (ref 135–145)

## 2012-03-24 LAB — D-DIMER, QUANTITATIVE: D-Dimer, Quant: 1.18 ug/mL-FEU — ABNORMAL HIGH (ref 0.00–0.48)

## 2012-03-24 MED ORDER — OXYCODONE-ACETAMINOPHEN 5-325 MG PO TABS: 1.0000 | ORAL_TABLET | ORAL | Status: DC | PRN

## 2012-03-24 MED ORDER — ASPIRIN 81 MG PO CHEW
324.0000 mg | CHEWABLE_TABLET | Freq: Once | ORAL | Status: AC
  Administered 2012-03-24: 324 mg via ORAL
  Filled 2012-03-24: qty 4

## 2012-03-24 MED ORDER — KETOROLAC TROMETHAMINE 30 MG/ML IJ SOLN
30.0000 mg | Freq: Once | INTRAMUSCULAR | Status: AC
  Administered 2012-03-24: 30 mg via INTRAVENOUS
  Filled 2012-03-24: qty 1

## 2012-03-24 MED ORDER — IOHEXOL 350 MG/ML SOLN
100.0000 mL | Freq: Once | INTRAVENOUS | Status: AC | PRN
  Administered 2012-03-24: 100 mL via INTRAVENOUS

## 2012-03-24 MED ORDER — MORPHINE SULFATE 4 MG/ML IJ SOLN
4.0000 mg | Freq: Once | INTRAMUSCULAR | Status: AC
  Administered 2012-03-24: 4 mg via INTRAVENOUS
  Filled 2012-03-24: qty 1

## 2012-03-24 NOTE — ED Provider Notes (Addendum)
History    This chart was scribed for [name], MD, MD by [name]. The patient was seen in room APA08 and the patient's care was started at 11:15AM.   CSN: [id number]  Arrival date & time 03/24/12  1058      Chief Complaint  Patient presents with  . Chest Pain    (Consider location/radiation/quality/duration/timing/severity/associated sxs/prior treatment) Patient is a 66 y.o. female presenting with chest pain. The history is provided by the patient. No language interpreter was used.  Chest Pain Primary symptoms include shortness of breath, cough and nausea.    [name] is a 66 y.o. female with hx of hyperlipemia who presents to the Emergency Department complaining of constant, moderate left chest pain onset 1 day ago. Pt reports that breathing aggravates the pain. Pt reports that she feels like something is swelling in her chest. She rates the pain at 9/10. Reports SOB, mild cough, diaphoresis and nausea. Denies vomiting. Reports that she smokes 1 pack of cigarettes/day. Denies taking asa PTA.   PCP is Dr. [name]   Past Medical History  Diagnosis Date  . Hyperlipemia   . Sinusitis   . Back pain   . Fibromyalgia   . Stroke     Past Surgical History  Procedure Date  . Cholecystectomy   . Abdominal hysterectomy     Family History  Problem Relation Age of Onset  . Depression Mother   . Depression Father   . Depression Sister     History  Substance Use Topics  . Smoking status: Current Every Day Smoker -- 0.6 packs/day for 30 years    Types: Cigarettes  . Smokeless tobacco: Not on file  . Alcohol Use: No    OB History    Grav Para Term Preterm Abortions TAB SAB Ect Mult Living                  Review of Systems  Respiratory: Positive for cough and shortness of breath.   Cardiovascular: Positive for chest pain.  Gastrointestinal: Positive for nausea.  All other systems reviewed and are negative.    Allergies  Review of patient's allergies  indicates no known allergies.  Home Medications   Current Outpatient Rx  Name Route Sig Dispense Refill  . ACETAMINOPHEN 500 MG PO TABS Oral Take 1,500 mg by mouth every 6 (six) hours as needed. For pain    . CLONAZEPAM 1 MG PO TABS Oral Take 1 tablet (1 mg total) by mouth 3 (three) times daily. 90 tablet 1  . DIPHENHYDRAMINE HCL 25 MG PO CAPS Oral Take 25 mg by mouth every 6 (six) hours as needed. For allergies and sinus pressure    . FENOFIBRATE 160 MG PO TABS Oral Take 160 mg by mouth daily.     Marland Kitchen MECLIZINE HCL 25 MG PO TABS Oral Take 25 mg by mouth 3 (three) times daily as needed. For dizziness    . OXYCODONE-ACETAMINOPHEN 10-325 MG PO TABS Oral Take 1 tablet by mouth every 4 (four) hours as needed. For pain    . SERTRALINE HCL 100 MG PO TABS Oral Take 1 tablet (100 mg total) by mouth daily. 30 tablet 1  . SIMVASTATIN 20 MG PO TABS Oral Take 40 mg by mouth at bedtime.     Marland Kitchen ZOLPIDEM TARTRATE 10 MG PO TABS Oral Take 1 tablet (10 mg total) by mouth at bedtime. 30 tablet 1    BP 144/62  Pulse 70  Temp 99 F (  37.2 C) (Oral)  Resp 18  Ht 5\' 3"  (1.6 m)  Wt 118 lb (53.524 kg)  BMI 20.90 kg/m2  SpO2 97%  Physical Exam  Nursing note and vitals reviewed. Constitutional: She is oriented to person, place, and time. She appears well-developed and well-nourished. No distress.  HENT:  Head: Normocephalic and atraumatic.  Eyes: EOM are normal. Pupils are equal, round, and reactive to light.  Neck: Normal range of motion. Neck supple. No tracheal deviation present.  Cardiovascular: Normal rate, regular rhythm and normal heart sounds.   Pulmonary/Chest: Effort normal. No respiratory distress.       Moderate tenderness of left parasternal area  Abdominal: Soft. She exhibits no distension.  Musculoskeletal: Normal range of motion.  Neurological: She is alert and oriented to person, place, and time.  Skin: Skin is warm and dry.  Psychiatric: She has a normal mood and affect. Her behavior is  normal.    ED Course  Procedures (including critical care time) DIAGNOSTIC STUDIES: Oxygen Saturation is 96% on room air, normal by my interpretation.    COORDINATION OF CARE: 11:18 AM Discussed ED treatment with pt  11:18 AM Ordered:   Medications  acetaminophen (TYLENOL) 500 MG tablet (not administered)  diphenhydrAMINE (BENADRYL) 25 mg capsule (not administered)  aspirin chewable tablet 324 mg (324 mg Oral Given 03/24/12 1127)  ketorolac (TORADOL) 30 MG/ML injection 30 mg (30 mg Intravenous Given 03/24/12 1128)  morphine 4 MG/ML injection 4 mg (4 mg Intravenous Given 03/24/12 1211)  iohexol (OMNIPAQUE) 350 MG/ML injection 100 mL (100 mL Intravenous Contrast Given 03/24/12 1303)       Results for orders placed during the hospital encounter of 03/24/12  CBC WITH DIFFERENTIAL      Component Value Range   WBC 9.3  4.0 - 10.5 K/uL   RBC 3.83 (*) 3.87 - 5.11 MIL/uL   Hemoglobin 12.9  12.0 - 15.0 g/dL   HCT 16.1  09.6 - 04.5 %   MCV 99.0  78.0 - 100.0 fL   MCH 33.7  26.0 - 34.0 pg   MCHC 34.0  30.0 - 36.0 g/dL   RDW 40.9  81.1 - 91.4 %   Platelets 259  150 - 400 K/uL   Neutrophils Relative 74  43 - 77 %   Neutro Abs 6.9  1.7 - 7.7 K/uL   Lymphocytes Relative 16  12 - 46 %   Lymphs Abs 1.5  0.7 - 4.0 K/uL   Monocytes Relative 8  3 - 12 %   Monocytes Absolute 0.8  0.1 - 1.0 K/uL   Eosinophils Relative 1  0 - 5 %   Eosinophils Absolute 0.1  0.0 - 0.7 K/uL   Basophils Relative 1  0 - 1 %   Basophils Absolute 0.1  0.0 - 0.1 K/uL  BASIC METABOLIC PANEL      Component Value Range   Sodium 138  135 - 145 mEq/L   Potassium 3.9  3.5 - 5.1 mEq/L   Chloride 104  96 - 112 mEq/L   CO2 22  19 - 32 mEq/L   Glucose, Bld 114 (*) 70 - 99 mg/dL   BUN 11  6 - 23 mg/dL   Creatinine, Ser 7.82  0.50 - 1.10 mg/dL   Calcium 9.7  8.4 - 95.6 mg/dL   GFR calc non Af Amer 86 (*) >90 mL/min   GFR calc Af Amer >90  >90 mL/min  TROPONIN I      Component Value Range  Troponin I <0.30  <0.30  ng/mL  D-DIMER, QUANTITATIVE      Component Value Range   D-Dimer, Quant 1.18 (*) 0.00 - 0.48 ug/mL-FEU   Dg Chest 2 View  03/24/2012  *RADIOLOGY REPORT*  Clinical Data: Left chest and rib pain, smoker, shortness of breath  CHEST - 2 VIEW  Comparison: 02/18/2011  Findings: Normal heart size and pulmonary vascularity. Atherosclerotic calcifications aortic arch. Lungs slightly hyperinflated but clear. No pleural effusion or pneumothorax. Capsular calcification at the left breast prosthesis. Diffuse osseous demineralization.  IMPRESSION: Chronic pulmonary hyperinflation. No acute abnormalities.   Original Report Authenticated By: Lollie Marrow, M.D.    Ct Angio Chest W/cm &/or Wo Cm  03/24/2012  *RADIOLOGY REPORT*  Clinical Data:  Chest pain, short of breath.  CT ANGIOGRAPHY CHEST  Technique:  Multidetector CT imaging of the chest using the standard protocol during bolus administration of intravenous contrast. Multiplanar reconstructed images including MIPs were obtained and reviewed to evaluate the vascular anatomy.  Contrast: OMNIPAQUE IOHEXOL 350 MG/ML SOLN  Comparison: Chest x-ray earlier today, 03/24/2012 at the 11:30 a.m.  Findings:  Mediastinum: Unremarkable thyroid gland and thoracic inlet.  No suspicious mediastinal or hilar adenopathy.  Thoracic esophagus is within normal limits.  Heart/Vascular: There is a bovine configuration of the aortic arch (two vessel arch with common origin of the brachiocephalic and left common carotid arteries), a normal anatomic variant.  Mild calcified atherosclerotic vascular disease.  No acute dissection, or aneurysmal dilatation.  There is at least moderate narrowing of the proximal right subclavian artery secondary to a combination of calcified and noncalcified atheromatous plaque just beyond the origin of the right common carotid artery. The heart is within normal limits for size. Slightly prominent epicardial fat.  Trace pericardial fluid versus thickening  anteriorly.  The pulmonary arteries are well opacified to the segmental level.  No central filling defect to suggest acute PE.  Lungs/Pleura: Trace left pleural effusion.  Mild dependent atelectasis in the bilateral lower lobes.  Edema, or focal airspace consolidation.  No suspicious pulmonary nodules.  Upper Abdomen: Unremarkable appearance of the visualized upper abdomen.  Bones: No acute fracture or aggressive appearing lytic or blastic osseous lesion.  Peripheral calcification of the left greater than right subpectoral breast augmentation prostheses.  IMPRESSION:  1.  Negative for pulmonary embolus. 2.  Trace left pleural effusion 3.  Dependent bilateral lower lobe atelectasis. 4.  Focal at least moderate stenosis of the proximal right subclavian artery. Recommend clinical correlation for signs and symptoms of right upper extremity claudication, or vertebral steal. If clinically warranted, consider further outpatient consultation with interventional radiology. 5.  Trace pericardial fluid versus thickening anteriorly.   Original Report Authenticated By: Sterling Big, M.D.       Date: 03/24/2012  Rate: 79  Rhythm: normal sinus rhythm  QRS Axis: normal  Intervals: normal  ST/T Wave abnormalities: nonspecific ST/T changes  Conduction Disutrbances:none  Narrative Interpretation: Nonspecific ST and T changes, no prior ECG available for comparison.  Old EKG Reviewed: none available    1. Pleuritic chest pain   2.  moderate stenosis of right subclavian artery    MDM  Pleuritic chest pain which is most likely to be costochondritis. Workup will be done including d-dimer to rule out pulmonary embolism. She will be given a therapeutic trial of ketorolac.  Pain is worse after ketorolac. She'll be given morphine for pain. D-dimer is come back significantly elevated, and she will be sent for CT angiogram.  CT angiogram is negative for pulmonary emboli, but does show moderate stenosis of the  right subclavian artery. Is I reevaluated the patient and she has no carotid bruits. She did have significant pain relief from morphine. She will be sent home with a prescription for Percocet and she is advised to followup with her PCP for further evaluation for possible atherosclerotic disease such as carotid duplex scan and consideration for ultrasound evaluation of the abdominal aorta.  I personally performed the services described in this documentation, which was scribed in my presence. The recorded information has been reviewed and considered.      [name], MD 03/24/12 1404  [name], MD 03/24/12 [phone]

## 2012-03-24 NOTE — ED Notes (Signed)
Pt presents with chest pain and SOB x 2 days. Pt refers to pain as increasing when she takes a breath. Pain began yesterday and worsened this morning. EKG completed, given to EDP. Pt denies cough, N/V, arm or jaw pain at this time. Pt has history of COPD, BBS clear and equal. VS WNL. Pt is alert and oriented x 4. Family at bedside

## 2012-03-24 NOTE — ED Notes (Signed)
Chest pain and sob since yesterday , alert,

## 2012-03-30 ENCOUNTER — Other Ambulatory Visit (HOSPITAL_COMMUNITY): Payer: Self-pay | Admitting: *Deleted

## 2012-03-30 ENCOUNTER — Encounter (HOSPITAL_COMMUNITY): Payer: Self-pay | Admitting: Psychiatry

## 2012-03-30 DIAGNOSIS — F419 Anxiety disorder, unspecified: Secondary | ICD-10-CM

## 2012-03-30 MED ORDER — SERTRALINE HCL 100 MG PO TABS: 100.0000 mg | ORAL_TABLET | Freq: Every day | ORAL | Status: DC

## 2012-03-30 MED ORDER — ZOLPIDEM TARTRATE 10 MG PO TABS: 10.0000 mg | ORAL_TABLET | Freq: Every day | ORAL | Status: DC

## 2012-03-30 MED ORDER — CLONAZEPAM 1 MG PO TABS: 1.0000 mg | ORAL_TABLET | Freq: Three times a day (TID) | ORAL | Status: DC

## 2012-03-30 NOTE — Progress Notes (Signed)
This encounter was created in error - please disregard.

## 2012-04-01 ENCOUNTER — Emergency Department (HOSPITAL_COMMUNITY)
Admission: EM | Admit: 2012-04-01 | Discharge: 2012-04-01 | Disposition: A | Discharge status: Home / Self Care | Payer: Medicare Other | Attending: Emergency Medicine | Admitting: Emergency Medicine

## 2012-04-01 ENCOUNTER — Emergency Department (HOSPITAL_COMMUNITY): Payer: Medicare Other

## 2012-04-01 ENCOUNTER — Encounter (HOSPITAL_COMMUNITY): Payer: Self-pay | Admitting: Emergency Medicine

## 2012-04-01 DIAGNOSIS — F1110 Opioid abuse, uncomplicated: Secondary | ICD-10-CM

## 2012-04-01 DIAGNOSIS — F1310 Sedative, hypnotic or anxiolytic abuse, uncomplicated: Secondary | ICD-10-CM | POA: Insufficient documentation

## 2012-04-01 DIAGNOSIS — M549 Dorsalgia, unspecified: Secondary | ICD-10-CM | POA: Insufficient documentation

## 2012-04-01 DIAGNOSIS — Z9089 Acquired absence of other organs: Secondary | ICD-10-CM | POA: Insufficient documentation

## 2012-04-01 DIAGNOSIS — G8929 Other chronic pain: Secondary | ICD-10-CM | POA: Insufficient documentation

## 2012-04-01 DIAGNOSIS — Z79899 Other long term (current) drug therapy: Secondary | ICD-10-CM | POA: Insufficient documentation

## 2012-04-01 DIAGNOSIS — Z8673 Personal history of transient ischemic attack (TIA), and cerebral infarction without residual deficits: Secondary | ICD-10-CM | POA: Insufficient documentation

## 2012-04-01 DIAGNOSIS — IMO0001 Reserved for inherently not codable concepts without codable children: Secondary | ICD-10-CM | POA: Insufficient documentation

## 2012-04-01 LAB — CBC WITH DIFFERENTIAL/PLATELET
Basophils Absolute: 0 10*3/uL (ref 0.0–0.1)
Eosinophils Relative: 1 % (ref 0–5)
Lymphocytes Relative: 24 % (ref 12–46)
Lymphs Abs: 2.1 10*3/uL (ref 0.7–4.0)
MCV: 100.3 fL — ABNORMAL HIGH (ref 78.0–100.0)
Neutro Abs: 6 10*3/uL (ref 1.7–7.7)
Neutrophils Relative %: 68 % (ref 43–77)
Platelets: 301 10*3/uL (ref 150–400)
RBC: 3.7 MIL/uL — ABNORMAL LOW (ref 3.87–5.11)
RDW: 12.9 % (ref 11.5–15.5)
WBC: 8.8 10*3/uL (ref 4.0–10.5)

## 2012-04-01 LAB — COMPREHENSIVE METABOLIC PANEL
ALT: 7 U/L (ref 0–35)
AST: 12 U/L (ref 0–37)
Alkaline Phosphatase: 39 U/L (ref 39–117)
CO2: 27 mEq/L (ref 19–32)
Calcium: 9.6 mg/dL (ref 8.4–10.5)
Chloride: 104 mEq/L (ref 96–112)
GFR calc Af Amer: 68 mL/min — ABNORMAL LOW (ref >90)
GFR calc non Af Amer: 59 mL/min — ABNORMAL LOW (ref >90)
Glucose, Bld: 97 mg/dL (ref 70–99)
Potassium: 3.8 mEq/L (ref 3.5–5.1)
Sodium: 139 mEq/L (ref 135–145)
Total Bilirubin: 0.1 mg/dL — ABNORMAL LOW (ref 0.3–1.2)

## 2012-04-01 LAB — URINALYSIS, ROUTINE W REFLEX MICROSCOPIC
Bilirubin Urine: NEGATIVE
Hgb urine dipstick: NEGATIVE
Ketones, ur: NEGATIVE mg/dL
Nitrite: NEGATIVE
Specific Gravity, Urine: 1.01 (ref 1.005–1.030)
Urobilinogen, UA: 0.2 mg/dL (ref 0.0–1.0)

## 2012-04-01 MED ORDER — SODIUM CHLORIDE 0.9 % IV SOLN
INTRAVENOUS | Status: DC
  Administered 2012-04-01: 18:00:00 via INTRAVENOUS

## 2012-04-01 MED ORDER — DIPHENHYDRAMINE HCL 50 MG/ML IJ SOLN
25.0000 mg | Freq: Once | INTRAMUSCULAR | Status: AC
  Administered 2012-04-01: 25 mg via INTRAVENOUS
  Filled 2012-04-01: qty 1

## 2012-04-01 MED ORDER — SODIUM CHLORIDE 0.9 % IV BOLUS (SEPSIS)
1000.0000 mL | Freq: Once | INTRAVENOUS | Status: AC
  Administered 2012-04-01: 1000 mL via INTRAVENOUS

## 2012-04-01 MED ORDER — MORPHINE SULFATE 4 MG/ML IJ SOLN
4.0000 mg | Freq: Once | INTRAMUSCULAR | Status: AC
  Administered 2012-04-01: 4 mg via INTRAVENOUS
  Filled 2012-04-01: qty 1

## 2012-04-01 MED ORDER — OXYCODONE-ACETAMINOPHEN 5-325 MG PO TABS
2.0000 | ORAL_TABLET | Freq: Once | ORAL | Status: AC
  Administered 2012-04-01: 2 via ORAL
  Filled 2012-04-01: qty 2

## 2012-04-01 MED ORDER — METOCLOPRAMIDE HCL 5 MG/ML IJ SOLN
10.0000 mg | Freq: Once | INTRAMUSCULAR | Status: AC
  Administered 2012-04-01: 10 mg via INTRAVENOUS
  Filled 2012-04-01: qty 2

## 2012-04-01 NOTE — ED Notes (Signed)
Patient reports hysterectomy (partial) when asked about ovarian issues, but then states "they told me I don't have any and couldn't find them on the ultrasound"

## 2012-04-01 NOTE — ED Provider Notes (Signed)
History   This chart was scribed for Ward Givens, MD scribed by Magnus Sinning. The patient was seen in room APA19/APA19 at 15:47   CSN: [id number]  Arrival date & time 04/01/12  1446  Chief Complaint  Patient presents with  . Abdominal Pain  . Back Pain    (Consider location/radiation/quality/duration/timing/severity/associated sxs/prior treatment) The history is provided by the patient. No language interpreter was used.  [name] is a 66 y.o. female who presents to the Emergency Department complaining of constant severe pain located at her right groin that radiates into her lower right  back, onset 5 days ago with associated once occuring emesis that occurred yesterday, and 5 occurrences of loose bowel diarrhea that also occurred yesterday. Patient says she has not been eating very well, but does report that she has been drinking ensure. Pt notes her pain is aggravated with movement, yet modified with sleep, which she states is limited due to pain, and elevation of lower extremities. She explains that she has had a similar pain several years ago, but says this followed her hysterectomy. In addition to her hysterectomy, she notes hx of cholecystectomy , and appendectomy.  Pt has upcoming appt with [facility] physicians this week for follow up off chest pain (left sided) that she was evaluated for about 9 days ago.   She says she is currently retired and provides that she still currently smokes, but does not drink. Pt is currently on medication for cholesterol, as well as medications for depression and anxiety. Per husband, the patient has been on both Zoloft and Klonopin, primarily for treatment of depression sxs.  PCP: Dr. [name]   Past Medical History  Diagnosis Date  . Hyperlipemia   . Sinusitis   . Back pain   . Fibromyalgia   . Stroke     Past Surgical History  Procedure Date  . Cholecystectomy   . Abdominal hysterectomy     Family History  Problem Relation  Age of Onset  . Depression Mother   . Depression Father   . Depression Sister     History  Substance Use Topics  . Smoking status: Current Every Day Smoker -- 0.6 packs/day for 30 years    Types: Cigarettes  . Smokeless tobacco: Not on file  . Alcohol Use: No  Lives at home Lives with spouse retired  Review of Systems  Gastrointestinal: Positive for vomiting and diarrhea.  All other systems reviewed and are negative.    Allergies  Review of patient's allergies indicates no known allergies.  Home Medications   Current Outpatient Rx  Name Route Sig Dispense Refill  . ACETAMINOPHEN 500 MG PO TABS Oral Take 1,500 mg by mouth every 6 (six) hours as needed. For pain    . CLONAZEPAM 1 MG PO TABS Oral Take 1 tablet (1 mg total) by mouth 3 (three) times daily. 90 tablet 0  . DIPHENHYDRAMINE HCL 25 MG PO CAPS Oral Take 25 mg by mouth every 6 (six) hours as needed. For allergies and sinus pressure    . FENOFIBRATE 160 MG PO TABS Oral Take 160 mg by mouth daily.     Marland Kitchen MECLIZINE HCL 25 MG PO TABS Oral Take 25 mg by mouth 3 (three) times daily as needed. For dizziness    . OXYCODONE-ACETAMINOPHEN 10-325 MG PO TABS Oral Take 1 tablet by mouth every 4 (four) hours as needed. For pain    . SERTRALINE HCL 100 MG PO TABS Oral Take 1 tablet (  100 mg total) by mouth daily. 30 tablet 0  . SIMVASTATIN 20 MG PO TABS Oral Take 40 mg by mouth at bedtime.     Marland Kitchen ZOLPIDEM TARTRATE 10 MG PO TABS Oral Take 1 tablet (10 mg total) by mouth at bedtime. 30 tablet 0    BP 125/42  Pulse 77  Temp 98.2 F (36.8 C) (Oral)  Resp 18  Ht 5\' 3"  (1.6 m)  Wt 118 lb (53.524 kg)  BMI 20.90 kg/m2  SpO2 99%  Vital signs normal    Physical Exam  Nursing note and vitals reviewed. Constitutional: She is oriented to person, place, and time. She appears well-developed and well-nourished. No distress.  HENT:  Head: Normocephalic and atraumatic.  Eyes: Conjunctivae normal and EOM are normal.  Neck: Neck supple. No  tracheal deviation present.  Cardiovascular: Normal rate, regular rhythm and normal heart sounds.   Pulmonary/Chest: Effort normal. No respiratory distress.  Abdominal: She exhibits no distension. There is tenderness.       Tender in right inguinal area without masses. And tender in the right flank.  Musculoskeletal: Normal range of motion.  Neurological: She is alert and oriented to person, place, and time. No sensory deficit.  Skin: Skin is warm and dry.  Psychiatric: She has a normal mood and affect. Her behavior is normal.    ED Course  Procedures (including critical care time)   Medications  0.9 %  sodium chloride infusion (  Intravenous New Bag/Given 04/01/12 1823)  sodium chloride 0.9 % bolus 1,000 mL (0 mL Intravenous Stopped 04/01/12 1823)  morphine 4 MG/ML injection 4 mg (4 mg Intravenous Given 04/01/12 1625)  diphenhydrAMINE (BENADRYL) injection 25 mg (25 mg Intravenous Given 04/01/12 1621)  metoCLOPramide (REGLAN) injection 10 mg (10 mg Intravenous Given 04/01/12 1623)  oxyCODONE-acetaminophen (PERCOCET/ROXICET) 5-325 MG per tablet 2 tablet (2 tablet Oral Given 04/01/12 1842)     DIAGNOSTIC STUDIES: Oxygen Saturation is 99% on room air, normal by my interpretation.    COORDINATION OF CARE:  Nurse reports when he gave patient the morphine ordered patient told him "That's not going to help".   19:42: Informed pt of radiology results and provided that it was normal. Patient admits to take more pain medication than prescribed. She stated that she has been stretching out formerly prescribed pain medications, but has recently run out. Patient is made aware that pain may be associated with pain medication withdrawal.  She will be given one dose of pain medication in ED and she is recommended f/u with pain specialist, which she has an appt in 4 days. Pt states "they don't give me enough pain medications".    Review of [facility] shows patient was getting oxycodone 10/325,  # 120  on July 31 and increased to #180 on 8/28 and 9/25 prescribed by Dr Manon Hilding. Before that she was only getting 30-60 at a time   Review of Dr Sheela Stack office notes earlier this summer shows pt's greatest concern was not getting enough pain medications when her PCP stopped her narcotics and she was waiting to go to her pain management.     Results for orders placed during the hospital encounter of 04/01/12  CBC WITH DIFFERENTIAL      Component Value Range   WBC 8.8  4.0 - 10.5 K/uL   RBC 3.70 (*) 3.87 - 5.11 MIL/uL   Hemoglobin 12.4  12.0 - 15.0 g/dL   HCT 29.5  62.1 - 30.8 %   MCV 100.3 (*) 78.0 -  100.0 fL   MCH 33.5  26.0 - 34.0 pg   MCHC 33.4  30.0 - 36.0 g/dL   RDW 78.4  69.6 - 29.5 %   Platelets 301  150 - 400 K/uL   Neutrophils Relative 68  43 - 77 %   Neutro Abs 6.0  1.7 - 7.7 K/uL   Lymphocytes Relative 24  12 - 46 %   Lymphs Abs 2.1  0.7 - 4.0 K/uL   Monocytes Relative 7  3 - 12 %   Monocytes Absolute 0.6  0.1 - 1.0 K/uL   Eosinophils Relative 1  0 - 5 %   Eosinophils Absolute 0.1  0.0 - 0.7 K/uL   Basophils Relative 0  0 - 1 %   Basophils Absolute 0.0  0.0 - 0.1 K/uL  COMPREHENSIVE METABOLIC PANEL      Component Value Range   Sodium 139  135 - 145 mEq/L   Potassium 3.8  3.5 - 5.1 mEq/L   Chloride 104  96 - 112 mEq/L   CO2 27  19 - 32 mEq/L   Glucose, Bld 97  70 - 99 mg/dL   BUN 13  6 - 23 mg/dL   Creatinine, Ser 2.84  0.50 - 1.10 mg/dL   Calcium 9.6  8.4 - 13.2 mg/dL   Total Protein 6.8  6.0 - 8.3 g/dL   Albumin 3.7  3.5 - 5.2 g/dL   AST 12  0 - 37 U/L   ALT 7  0 - 35 U/L   Alkaline Phosphatase 39  39 - 117 U/L   Total Bilirubin 0.1 (*) 0.3 - 1.2 mg/dL   GFR calc non Af Amer 59 (*) >90 mL/min   GFR calc Af Amer 68 (*) >90 mL/min  URINALYSIS, ROUTINE W REFLEX MICROSCOPIC      Component Value Range   Color, Urine YELLOW  YELLOW   APPearance CLEAR  CLEAR   Specific Gravity, Urine 1.010  1.005 - 1.030   pH 6.0  5.0 - 8.0   Glucose, UA NEGATIVE  NEGATIVE mg/dL    Hgb urine dipstick NEGATIVE  NEGATIVE   Bilirubin Urine NEGATIVE  NEGATIVE   Ketones, ur NEGATIVE  NEGATIVE mg/dL   Protein, ur NEGATIVE  NEGATIVE mg/dL   Urobilinogen, UA 0.2  0.0 - 1.0 mg/dL   Nitrite NEGATIVE  NEGATIVE   Leukocytes, UA NEGATIVE  NEGATIVE   Laboratory interpretation all normal   Ct Abdomen Pelvis Wo Contrast  04/01/2012  *RADIOLOGY REPORT*  Clinical Data: Right lower quadrant pain.  Appendectomy.  CT ABDOMEN AND PELVIS WITHOUT CONTRAST  Technique:  Multidetector CT imaging of the abdomen and pelvis was performed following the standard protocol without intravenous contrast.  Comparison: CT 09/12 seven  Findings: Negative for urinary tract calculi.  No hydronephrosis. No bladder calculi.  Right renal cyst measuring 3.9 cm.  Left renal cyst measures 2.0 cm.  Prior cholecystectomy.  Liver spleen and pancreas are normal. Negative for bowel obstruction.  No bowel thickening.  No free fluid in the pelvis.  No mass or adenopathy.  Lung bases are clear.  IMPRESSION: No acute abnormality.  No renal calculi or obstruction.   Original Report Authenticated By: Camelia Phenes, M.D.      1. Chronic pain   2. Narcotic abuse     Plan discharge  Devoria Albe, MD, FACEP   MDM  I personally performed the services described in this documentation, which was scribed in my presence. The recorded information has been  reviewed and considered.  Devoria Albe, MD, Armando Gang         Ward Givens, MD 04/01/12 Mikle Bosworth

## 2012-04-01 NOTE — ED Notes (Signed)
Patient with c/o RLQ abdominal pain that radiates to lower right back since Monday. Patient with n/v/d. Denies urinary s/s. Was seen last week for chest pain and worked up for that. To follow up with cards Monday.

## 2012-04-03 ENCOUNTER — Ambulatory Visit (INDEPENDENT_AMBULATORY_CARE_PROVIDER_SITE_OTHER): Payer: Medicare Other | Admitting: Internal Medicine

## 2012-04-03 ENCOUNTER — Encounter: Payer: Self-pay | Admitting: *Deleted

## 2012-04-03 ENCOUNTER — Encounter: Payer: Self-pay | Admitting: Internal Medicine

## 2012-04-03 VITALS — BP 110/70 | HR 75 | Ht 63.0 in | Wt 118.1 lb

## 2012-04-03 DIAGNOSIS — R0989 Other specified symptoms and signs involving the circulatory and respiratory systems: Secondary | ICD-10-CM

## 2012-04-03 DIAGNOSIS — R0789 Other chest pain: Secondary | ICD-10-CM | POA: Insufficient documentation

## 2012-04-03 DIAGNOSIS — R079 Chest pain, unspecified: Secondary | ICD-10-CM

## 2012-04-03 NOTE — Assessment & Plan Note (Signed)
The etiology of her symptoms is unclear. I have recommended evaluation with exercise stress testing. Will schedule in the next week or two.

## 2012-04-03 NOTE — Assessment & Plan Note (Signed)
Evaluation 2 years ago demonstrated non-obstructive disease of the right carotid. Will repeat carotid ultrasound.

## 2012-04-03 NOTE — Progress Notes (Signed)
HPI [name] is referred today for evaluation of chest pain and a carotid bruit. The patient has a long history of fibromyalgia and chronic back pain. She has had intermittent episodes of sscp, no radiation. Her symptoms will at times have a pleuritic component and at other times related to palpitations. She denies syncope or near syncope. She also notes some pain in her right leg, near the inguinal crease.   No Known Allergies   Current Outpatient Prescriptions  Medication Sig Dispense Refill  . acetaminophen (TYLENOL) 500 MG tablet Take 1,500 mg by mouth every 6 (six) hours as needed. For pain      . clonazePAM (KLONOPIN) 1 MG tablet Take 1 tablet (1 mg total) by mouth 3 (three) times daily.  90 tablet  0  . diphenhydrAMINE (BENADRYL) 25 mg capsule Take 25 mg by mouth every 6 (six) hours as needed. For allergies and sinus pressure      . fenofibrate 160 MG tablet Take 160 mg by mouth daily.       . meclizine (ANTIVERT) 25 MG tablet Take 25 mg by mouth 3 (three) times daily as needed. For dizziness      . oxyCODONE-acetaminophen (PERCOCET) 10-325 MG per tablet Take 1 tablet by mouth every 4 (four) hours as needed. For pain      . sertraline (ZOLOFT) 100 MG tablet Take 1 tablet (100 mg total) by mouth daily.  30 tablet  0  . simvastatin (ZOCOR) 20 MG tablet Take 40 mg by mouth at bedtime.       Marland Kitchen zolpidem (AMBIEN) 10 MG tablet Take 1 tablet (10 mg total) by mouth at bedtime.  30 tablet  0     Past Medical History  Diagnosis Date  . Hyperlipemia   . Sinusitis   . Back pain   . Fibromyalgia   . Stroke     ROS:   All systems reviewed and negative except as noted in the HPI.   Past Surgical History  Procedure Date  . Cholecystectomy   . Abdominal hysterectomy      Family History  Problem Relation Age of Onset  . Depression Mother   . Depression Father   . Depression Sister      History   Social History  . Marital Status: Married    Spouse Name: N/A    Number of  Children: N/A  . Years of Education: N/A   Occupational History  . Not on file.   Social History Main Topics  . Smoking status: Current Every Day Smoker -- 0.6 packs/day for 30 years    Types: Cigarettes  . Smokeless tobacco: Not on file  . Alcohol Use: No  . Drug Use: No  . Sexually Active: Yes    Birth Control/ Protection: Surgical   Other Topics Concern  . Not on file   Social History Narrative  . No narrative on file     BP 110/70  Pulse 75  Ht 5\' 3"  (1.6 m)  Wt 118 lb 1.9 oz (53.579 kg)  BMI 20.92 kg/m2  SpO2 96%  Physical Exam:  Well appearing NAD HEENT: Unremarkable Neck:  No JVD, no thyromegally Lungs:  Clear with no wheezes, rales, or rhonchi HEART:  Regular rate rhythm, no murmurs, no rubs, no clicks Abd:  soft, positive bowel sounds, no organomegally, no rebound, no guarding Ext:  2 plus pulses, no edema, no cyanosis, no clubbing, right groin with minimal tenderness. Skin:  No rashes no nodules Neuro:  CN II through XII intact, motor grossly intact  EKG - NSR with nonspecific T wave abnormality.   Assess/Plan:

## 2012-04-03 NOTE — Patient Instructions (Addendum)
Your physician recommends that you schedule a follow-up appointment in: 2 months  Your physician has requested that you have a carotid duplex. This test is an ultrasound of the carotid arteries in your neck. It looks at blood flow through these arteries that supply the brain with blood. Allow one hour for this exam. There are no restrictions or special instructions.  Stress Test

## 2012-04-11 ENCOUNTER — Encounter (HOSPITAL_COMMUNITY): Payer: Self-pay

## 2012-04-11 ENCOUNTER — Encounter (HOSPITAL_COMMUNITY)
Admission: RE | Admit: 2012-04-11 | Discharge: 2012-04-11 | Disposition: A | Discharge status: Home / Self Care | Payer: Medicare Other | Source: Ambulatory Visit | Attending: Internal Medicine | Admitting: Internal Medicine

## 2012-04-11 ENCOUNTER — Ambulatory Visit (HOSPITAL_COMMUNITY)
Admission: RE | Admit: 2012-04-11 | Discharge: 2012-04-11 | Disposition: A | Discharge status: Home / Self Care | Payer: Medicare Other | Source: Ambulatory Visit | Attending: Internal Medicine | Admitting: Internal Medicine

## 2012-04-11 ENCOUNTER — Encounter (HOSPITAL_COMMUNITY): Payer: Self-pay | Admitting: Cardiology

## 2012-04-11 DIAGNOSIS — R0789 Other chest pain: Secondary | ICD-10-CM

## 2012-04-11 DIAGNOSIS — R079 Chest pain, unspecified: Secondary | ICD-10-CM | POA: Insufficient documentation

## 2012-04-11 DIAGNOSIS — R0989 Other specified symptoms and signs involving the circulatory and respiratory systems: Secondary | ICD-10-CM

## 2012-04-11 MED ORDER — SODIUM CHLORIDE 0.9 % IJ SOLN
INTRAMUSCULAR | Status: AC
  Administered 2012-04-11: 10 mL via INTRAVENOUS
  Filled 2012-04-11: qty 10

## 2012-04-11 MED ORDER — REGADENOSON 0.4 MG/5ML IV SOLN
INTRAVENOUS | Status: AC
  Administered 2012-04-11: 0.4 mg via INTRAVENOUS
  Filled 2012-04-11: qty 5

## 2012-04-11 MED ORDER — TECHNETIUM TC 99M SESTAMIBI - CARDIOLITE
10.0000 | Freq: Once | INTRAVENOUS | Status: AC | PRN
  Administered 2012-04-11: 10 via INTRAVENOUS

## 2012-04-11 MED ORDER — TECHNETIUM TC 99M SESTAMIBI - CARDIOLITE
30.0000 | Freq: Once | INTRAVENOUS | Status: AC | PRN
  Administered 2012-04-11: 31 via INTRAVENOUS

## 2012-04-11 NOTE — Progress Notes (Signed)
Stress Lab Nurses Notes - Jeani Hawking  CHASITIE PASSEY 04/11/2012 Reason for doing test: Chest Pain and carotid bruit Type of test: Marlane Hatcher Nurse performing test: Parke Poisson, RN Nuclear Medicine Tech: Lyndel Pleasure Echo Tech: Not Applicable MD performing test: Ival Bible Family MD: Dwana Melena Test explained and consent signed: yes IV started: 22g jelco, Saline lock flushed, No redness or edema and Saline lock started in radiology Symptoms: SOB & headach Treatment/Intervention: None Reason test stopped: protocol completed After recovery IV was: Discontinued via X-ray tech and No redness or edema Patient to return to Nuc. Med at : 11:30 Patient discharged: Home Patient's Condition upon discharge was: stable Comments: During test BP 145/83 & HR 131.  Recovery BP 132/73.  HR 79.  Symptoms resolved in recovery. Erskine Speed T

## 2012-05-05 ENCOUNTER — Ambulatory Visit (INDEPENDENT_AMBULATORY_CARE_PROVIDER_SITE_OTHER): Payer: Medicare Other | Admitting: Psychiatry

## 2012-05-05 ENCOUNTER — Encounter (HOSPITAL_COMMUNITY): Payer: Self-pay | Admitting: Psychiatry

## 2012-05-05 VITALS — BP 122/78 | HR 76 | Ht 63.5 in | Wt 123.6 lb

## 2012-05-05 DIAGNOSIS — IMO0001 Reserved for inherently not codable concepts without codable children: Secondary | ICD-10-CM

## 2012-05-05 DIAGNOSIS — F419 Anxiety disorder, unspecified: Secondary | ICD-10-CM

## 2012-05-05 DIAGNOSIS — F411 Generalized anxiety disorder: Secondary | ICD-10-CM

## 2012-05-05 DIAGNOSIS — M797 Fibromyalgia: Secondary | ICD-10-CM | POA: Insufficient documentation

## 2012-05-05 DIAGNOSIS — M549 Dorsalgia, unspecified: Secondary | ICD-10-CM

## 2012-05-05 MED ORDER — ZOLPIDEM TARTRATE 10 MG PO TABS: 10.0000 mg | ORAL_TABLET | Freq: Every day | ORAL | Status: DC

## 2012-05-05 MED ORDER — CLONAZEPAM 1 MG PO TABS: 1.0000 mg | ORAL_TABLET | Freq: Three times a day (TID) | ORAL | Status: DC

## 2012-05-05 MED ORDER — SERTRALINE HCL 100 MG PO TABS: 100.0000 mg | ORAL_TABLET | Freq: Every day | ORAL | Status: DC

## 2012-05-05 NOTE — Addendum Note (Signed)
Addended by: Mike Craze on: 05/05/2012 11:29 AM   Modules accepted: Orders

## 2012-05-05 NOTE — Progress Notes (Signed)
Chief complaint Medication refill and office visit  History of present illness Patient is 66 year old married unemployed female who came with her husband for followup appointment. The husband is very insistent that she has not gotten adequate pain management.  He has taken her to the ED twice in a weeks time a few weeks ago and was found to have a flare up of the fibromyalgia.  He has fibromyalgia as well.  Discussed how pain actually exists as another partner in her life.  The husband agrees with that statement.  She is set up with an appointment with the pain management doctor at [facility] soon.  The husband is very insistent that she stay on the opiates so that she can function some instead of not functioning at all.    Current psychiatric medication Zoloft 100 mg daily Klonopin 1 mg 3 times a day and forth as needed  Ambien 10 mg at bedtime.    Medical history Patient has history of hyperlipidemia, sinusitis, seasonal allergies, chronic back pain and fibromyalgia.    Social history Patient is unemployed.  She lives with her husband.  She is very attached to her daughter.  Mental status emanation Patient is well groomed and well dressed. She maintained fair eye contact.  She is pleasant and cooperative.  Her speech is soft and clear .  Her thought process is logical linear and goal-directed.  She described her mood is good and her affect is bright from the past.  She denies any active or passive suicidal thoughts or homicidal thoughts.  She denies any auditory or visual hallucination.  There were no delusion or psychosis present.  Her attention and concentration is fair.  She's oriented x3.  Her insight judgment and pulse control is okay.  Assessment Axis I Anxiety disorder NOS, Pain syndrome and mood disorder due to general medical condition Axis II deferred Axis III see medical history Axis IV mild to moderate  Axis V 60-65  Plan I took her vitals.  I reviewed CC, tobacco/med/surg  Hx, meds effects/ side effects, problem list, therapies and responses as well as current situation/symptoms discussed options. See med orders and pt instructions for more details.

## 2012-05-06 ENCOUNTER — Other Ambulatory Visit (HOSPITAL_COMMUNITY): Payer: Self-pay | Admitting: Psychiatry

## 2012-05-16 ENCOUNTER — Other Ambulatory Visit (HOSPITAL_COMMUNITY): Payer: Self-pay | Admitting: *Deleted

## 2012-05-16 ENCOUNTER — Telehealth (HOSPITAL_COMMUNITY): Payer: Self-pay

## 2012-05-16 DIAGNOSIS — F419 Anxiety disorder, unspecified: Secondary | ICD-10-CM

## 2012-05-16 MED ORDER — ZOLPIDEM TARTRATE 10 MG PO TABS: 10.0000 mg | ORAL_TABLET | Freq: Every day | ORAL | Status: DC

## 2012-05-16 NOTE — Telephone Encounter (Signed)
Ambien refilled.  Husband is insistent that the pt remain on the same medications.

## 2012-05-17 ENCOUNTER — Telehealth (HOSPITAL_COMMUNITY): Payer: Self-pay | Admitting: *Deleted

## 2012-05-17 ENCOUNTER — Other Ambulatory Visit (HOSPITAL_COMMUNITY): Payer: Self-pay | Admitting: Psychiatry

## 2012-05-17 NOTE — Telephone Encounter (Signed)
Spoke to husband.  Patient was recently seen by Dr. Dewaine Conger in [place].  Patient husband insisted that Ambien was not given by Dr. Dan Humphreys and he like to see this writer in the future.  I explained that Ambien was called in yesterday and he need to pick up the prescription from the pharmacy.  Patient acknowledged.

## 2012-05-18 NOTE — Telephone Encounter (Signed)
Fax request for Ambien received.  Ambien is not indicated for insomnia for more than 28 days.  Ambien had been discontinued on the last visit. lavil is indicated for her insomnia, depression, fibromyalgia, and pain.  That info was faxed to the pharmacy.

## 2012-05-23 ENCOUNTER — Ambulatory Visit: Payer: Self-pay | Admitting: Cardiology

## 2012-07-05 ENCOUNTER — Ambulatory Visit (HOSPITAL_COMMUNITY): Payer: Self-pay | Admitting: Psychiatry

## 2012-07-06 ENCOUNTER — Ambulatory Visit (INDEPENDENT_AMBULATORY_CARE_PROVIDER_SITE_OTHER): Payer: Medicare Other | Admitting: Psychiatry

## 2012-07-06 ENCOUNTER — Encounter (HOSPITAL_COMMUNITY): Payer: Self-pay | Admitting: Psychiatry

## 2012-07-06 VITALS — BP 117/86 | HR 75 | Wt 120.6 lb

## 2012-07-06 DIAGNOSIS — F419 Anxiety disorder, unspecified: Secondary | ICD-10-CM

## 2012-07-06 DIAGNOSIS — F411 Generalized anxiety disorder: Secondary | ICD-10-CM

## 2012-07-06 MED ORDER — SERTRALINE HCL 100 MG PO TABS: 100.0000 mg | ORAL_TABLET | Freq: Every day | ORAL | Status: DC

## 2012-07-06 MED ORDER — ZOLPIDEM TARTRATE 10 MG PO TABS: 10.0000 mg | ORAL_TABLET | Freq: Every evening | ORAL | Status: DC | PRN

## 2012-07-06 MED ORDER — CLONAZEPAM 1 MG PO TABS: 1.0000 mg | ORAL_TABLET | Freq: Three times a day (TID) | ORAL | Status: DC

## 2012-07-06 NOTE — Progress Notes (Signed)
Chief complaint I like to see you in this office.  I'm not comfortable with Dr. walker.  I'm doing better on the medication.  History of present illness Patient is 67 year old married unemployed female who came with her husband for followup appointment.  Patient patient was seeing me in [place] office however when I move full-time here she scheduled to see Dr. walker.  She did not like Dr. walker and wants to continue this Clinical research associate in [place] office.  Patient told that she was not given Ambien for past 2 months.  She still has some insomnia but overall she is better.  She is taking pain medication which is given by Dr. [name].  She's recently started.attention is helping her anxiety pain .  She had a good Christmas.  Her daughter and her grandson are also doing very well.  Her husband endorse much improvement with the medication and her pain .  She denies any recent panic attack nervousness or any agitation anger mood swing.  She is taking moderate dose of pain medication muscle relaxant .  She denies any side effects of Zoloft .  In the past we had tried Effexor which did not help.  We also tried very heavy dose of Zoloft but patient don't side effects.  She is very happy with her current dose of Zoloft.  She denies any tremors or shakes.  She's not drinking or using any illegal substance.  Her vitals are stable.  She had a blood work 4 weeks ago at her primary care physician however we do not have results available.  Current psychiatric medication Zoloft 100 mg daily Klonopin 1 mg 3 times a day and forth as needed  Ambien 10 mg at bedtime not taking in recent months.  Past psychiatric history Patient has been seeing this Clinical research associate for past few years.  In the past she has taken Effexor which did not work.    Medical history Patient has history of hyperlipidemia, sinusitis, seasonal allergies, chronic back pain and fibromyalgia.  Her primary care physician is Dr. [name]  Social history Patient is  unemployed.  She lives with her husband.  She is very attached to her daughter.  Mental status emanation Patient is well groomed and well dressed. She maintained fair eye contact.  She is pleasant and cooperative.  Her speech is soft and clear .  Her thought process is logical linear and goal-directed.  She described her mood is anxious and her affect is mood appropriate.  There were no flight of ideas or any loose association.  She denies any active or passive suicidal thoughts or homicidal thoughts.  She denies any auditory or visual hallucination.  There were no delusion or psychosis present.  Her attention and concentration is fair.  She's oriented x3.  Her insight judgment and pulse control is okay.  Assessment Axis I Anxiety disorder NOS  Axis II deferred Axis III see medical history Axis IV mild to moderate  Axis V 60-65  Plan I reviewed chart, last progress note, medication and vitals.  She is taking Percocet, Flexeril and gabapentin for her pain.  Her pain is much better from the past.  Patient does not abuse her benzodiazepine.  Her anxiety and depression is much improved however she continues to have insomnia and her Ambien was not renewed.  I discuss the patient about her insomnia and arthritis when necessary Ambien 10 mg for sleep.  Risk and benefits of the medication explain in detail.  I recommend to call us  if she is any question or concern or if he feel worsening of the symptom.  Patient has a blood work done at primary care physician , I recommend to have her blood test results faxed to [place].  I will see her again in 2 months.  Time spent 25 minutes.  More than 50% of time spent and psychoeducation counseling and [facility] of care.  Portion of this note is generated with voice recognition software and may contain typographical error.

## 2012-09-04 ENCOUNTER — Encounter (HOSPITAL_COMMUNITY): Payer: Self-pay | Admitting: Psychiatry

## 2012-09-04 ENCOUNTER — Ambulatory Visit (INDEPENDENT_AMBULATORY_CARE_PROVIDER_SITE_OTHER): Payer: Medicare Other | Admitting: Psychiatry

## 2012-09-04 VITALS — BP 110/53 | HR 75 | Wt 122.2 lb

## 2012-09-04 DIAGNOSIS — F411 Generalized anxiety disorder: Secondary | ICD-10-CM

## 2012-09-04 DIAGNOSIS — F419 Anxiety disorder, unspecified: Secondary | ICD-10-CM

## 2012-09-04 MED ORDER — CLONAZEPAM 1 MG PO TABS: 1.0000 mg | ORAL_TABLET | Freq: Three times a day (TID) | ORAL | Status: DC

## 2012-09-04 MED ORDER — SERTRALINE HCL 100 MG PO TABS: 100.0000 mg | ORAL_TABLET | Freq: Every day | ORAL | Status: DC

## 2012-09-04 NOTE — Progress Notes (Signed)
[facility] [id number] Progress Note  [name] [id number] 67 y.o.  09/04/2012 11:00 AM  Chief Complaint: I'm doing fine on the medication.  History of Present Illness: Patient is 67 year old Caucasian unemployed female who came for her appointment with her husband.  She's compliant with the medication.  She denies any side effects.  She is not taking Ambien or Benadryl.  Recently her pain Dr. Verl Bangs her Flexeril to Tinazidine.  She still has back pain but overall she is doing better.  She denies any recent panic attack.  She sleeping better.  She wants to continue her current psychiatric medication.  Patient does not drink or ask for early refills of Klonopin.  Suicidal Ideation: No Plan Formed: No Patient has means to carry out plan: No  Homicidal Ideation: No Plan Formed: No Patient has means to carry out plan: No  Review of Systems: Psychiatric: Agitation: No Hallucination: No Depressed Mood: No Insomnia: No Hypersomnia: No Altered Concentration: No Feels Worthless: No Grandiose Ideas: No Belief In Special Powers: No New/Increased Substance Abuse: No Compulsions: No  Neurologic: Headache: No Seizure: No Paresthesias: No  Past Medical Family, Social History: Patient has history of hyperlipidemia, sinusitis, seasonal allergies and chronic pain.  She is seeing pain management for her chronic back pain.  She's unemployed.  She lives with her husband is very involved in her treatment.  Patient has a daughter and she is very attached with her daughter.  Outpatient Encounter Prescriptions as of 09/04/2012  Medication Sig Dispense Refill  . acetaminophen (TYLENOL) 500 MG tablet Take 1,500 mg by mouth every 6 (six) hours as needed. For pain      . clonazePAM (KLONOPIN) 1 MG tablet Take 1 tablet (1 mg total) by mouth 3 (three) times daily.  90 tablet  1  . fenofibrate 160 MG tablet Take 160 mg by mouth daily.       Marland Kitchen gabapentin (NEURONTIN) 300 MG capsule       .  sertraline (ZOLOFT) 100 MG tablet Take 1 tablet (100 mg total) by mouth daily.  30 tablet  1  . simvastatin (ZOCOR) 20 MG tablet Take 40 mg by mouth at bedtime.       . [DISCONTINUED] clonazePAM (KLONOPIN) 1 MG tablet Take 1 tablet (1 mg total) by mouth 3 (three) times daily.  90 tablet  1  . [DISCONTINUED] cyclobenzaprine (FLEXERIL) 10 MG tablet       . [DISCONTINUED] oxyCODONE-acetaminophen (PERCOCET) 10-325 MG per tablet Take 1 tablet by mouth every 4 (four) hours as needed. For pain      . [DISCONTINUED] sertraline (ZOLOFT) 100 MG tablet Take 1 tablet (100 mg total) by mouth daily.  30 tablet  1  . meclizine (ANTIVERT) 25 MG tablet Take 25 mg by mouth 3 (three) times daily as needed. For dizziness      . tiZANidine (ZANAFLEX) 2 MG tablet       . [DISCONTINUED] diphenhydrAMINE (BENADRYL) 25 mg capsule Take 25 mg by mouth every 6 (six) hours as needed. For allergies and sinus pressure      . [DISCONTINUED] zolpidem (AMBIEN) 10 MG tablet Take 1 tablet (10 mg total) by mouth at bedtime as needed for sleep.  15 tablet  1   No facility-administered encounter medications on file as of 09/04/2012.    Past Psychiatric History/Hospitalization(s): Anxiety: Patient has a long history of anxiety disorder.  She was getting her psychiatric medication from her primary care physician until stopped working.  She has  taken Effexor in the past. Bipolar Disorder: No Depression: Yes Mania: No Psychosis: No Schizophrenia: No Personality Disorder: No Hospitalization for psychiatric illness: No History of Electroconvulsive Shock Therapy: No Prior Suicide Attempts: No  Physical Exam: Constitutional:  BP 110/53  Pulse 75  Wt 122 lb 3.2 oz (55.43 kg)  BMI 21.3 kg/m2  General Appearance: alert, oriented, no acute distress, well nourished and Appears to be in her stated age.  She maintained fair eye contact.    Musculoskeletal: Strength & Muscle Tone: Complain of back pain Gait & Station: normal Patient  leans: N/A  Psychiatric: Speech (describe rate, volume, coherence, spontaneity, and abnormalities if any): Clear and coherent with normal tone and volume.  Thought Process (describe rate, content, abstract reasoning, and computation): Logical and goal-directed.  Associations: Coherent, Relevant and Intact  Thoughts: normal  Mental Status: Orientation: oriented to person, place, time/date and situation Mood & Affect: anxiety and Appropriate affect Attention Span & Concentration: Okay  Medical Decision Making (Choose Three): Established Problem, Stable/Improving (1), Review of Last Therapy Session (1) and Review of Medication Regimen & Side Effects (2)  Assessment: Axis I: Anxiety disorder NOS  Axis II: Deferred  Axis III: See medical history  Axis IV: Mild  Axis V: 60-65   Plan: Continue Zoloft 100 mg daily and Klonopin 1 mg 3 times a day.  Patient is not taking Benadryl and Ambien. Risk and benefit explain.  Patient does not ask for early refills.  Recommend to call us back if she is any question or concern if he feel worsening of the symptom.  I will see her again in 2 months.  ARFEEN,SYED T., MD 09/04/2012

## 2012-10-10 ENCOUNTER — Encounter (HOSPITAL_COMMUNITY): Payer: Self-pay | Admitting: Psychiatry

## 2012-10-10 ENCOUNTER — Ambulatory Visit (INDEPENDENT_AMBULATORY_CARE_PROVIDER_SITE_OTHER): Payer: Medicare Other | Admitting: Psychiatry

## 2012-10-10 VITALS — BP 155/76 | HR 58 | Ht 63.0 in | Wt 116.4 lb

## 2012-10-10 DIAGNOSIS — F411 Generalized anxiety disorder: Secondary | ICD-10-CM

## 2012-10-10 DIAGNOSIS — F419 Anxiety disorder, unspecified: Secondary | ICD-10-CM

## 2012-10-10 MED ORDER — DIAZEPAM 5 MG PO TABS: 5.0000 mg | ORAL_TABLET | Freq: Three times a day (TID) | ORAL | Status: DC

## 2012-10-10 MED ORDER — SERTRALINE HCL 100 MG PO TABS: ORAL_TABLET | ORAL | Status: DC

## 2012-10-10 NOTE — Progress Notes (Signed)
[facility] [id number] Progress Note  [name] [id number] 67 y.o.  10/10/2012 2:46 PM  Chief Complaint:  I am not doing good.  I'm very anxious .    History of Present Illness: Patient is 67 year old Caucasian unemployed female who came for her appointment with her husband.  Patient is earlier than her scheduled appointment.  She complained of increased anxiety , crying spells and panic attack.  She feels very isolated and withdrawn.  She did spend her birthday at home .  She endorsed decreased energy and lack of motivation.  She complained back pain and recently seen her pain management .  She is taking narcotic pain medication.  She do not recall any trigger that causes of her anxiety .  She endorsed poor sleep racing thoughts and sometimes feeling hopeless and helpless.  There has been no new medication added.  She is not drinking or using any illegal substance.  She has any active or passive suicidal thoughts.  She does not feel Klonopin is working.  She wants to try a different medication.  Suicidal Ideation: No Plan Formed: No Patient has means to carry out plan: No  Homicidal Ideation: No Plan Formed: No Patient has means to carry out plan: No  Review of Systems  Constitutional: Positive for malaise/fatigue.  Musculoskeletal: Positive for back pain.       Back spasms  Neurological: Positive for weakness.  Psychiatric/Behavioral: Positive for depression. The patient is nervous/anxious.    Psychiatric: Agitation: No Hallucination: No Depressed Mood: Yes Insomnia: No Hypersomnia: No Altered Concentration: No Feels Worthless: Yes Grandiose Ideas: No Belief In Special Powers: No New/Increased Substance Abuse: No Compulsions: No  Neurologic: Headache: No Seizure: No Paresthesias: No  Past Medical Family, Social History:  Patient has history of hyperlipidemia, sinusitis, seasonal allergies and chronic pain.  She is seeing pain management for her chronic back  pain.  She's unemployed.  She lives with her husband is very involved in her treatment.  Patient has a daughter and she is very attached with her daughter.  Her primary care physician is Dr. [name]  Outpatient Encounter Prescriptions as of 10/10/2012  Medication Sig Dispense Refill  . acetaminophen (TYLENOL) 500 MG tablet Take 1,500 mg by mouth every 6 (six) hours as needed. For pain      . fenofibrate 160 MG tablet Take 160 mg by mouth daily.       Marland Kitchen gabapentin (NEURONTIN) 300 MG capsule       . meclizine (ANTIVERT) 25 MG tablet Take 25 mg by mouth 3 (three) times daily as needed. For dizziness      . oxyCODONE-acetaminophen (PERCOCET) 10-325 MG per tablet       . sertraline (ZOLOFT) 100 MG tablet Take 1 and 1/2 tab adily  45 tablet  0  . simvastatin (ZOCOR) 20 MG tablet Take 40 mg by mouth at bedtime.       Marland Kitchen tiZANidine (ZANAFLEX) 2 MG tablet       . [DISCONTINUED] clonazePAM (KLONOPIN) 1 MG tablet Take 1 tablet (1 mg total) by mouth 3 (three) times daily.  90 tablet  1  . [DISCONTINUED] sertraline (ZOLOFT) 100 MG tablet Take 1 tablet (100 mg total) by mouth daily.  30 tablet  1  . diazepam (VALIUM) 5 MG tablet Take 1 tablet (5 mg total) by mouth 3 (three) times daily.  90 tablet  0   No facility-administered encounter medications on file as of 10/10/2012.    Past Psychiatric History/Hospitalization(s):  Anxiety: Patient has a long history of anxiety disorder.  She was getting her psychiatric medication from her primary care physician until stopped working.  She has taken Effexor in the past. Bipolar Disorder: No Depression: Yes Mania: No Psychosis: No Schizophrenia: No Personality Disorder: No Hospitalization for psychiatric illness: No History of Electroconvulsive Shock Therapy: No Prior Suicide Attempts: No  Physical Exam: Constitutional:  BP 155/76  Pulse 58  Ht 5\' 3"  (1.6 m)  Wt 116 lb 6.4 oz (52.799 kg)  BMI 20.62 kg/m2  General Appearance: well nourished and Tired.  She is  casually dressed.  Musculoskeletal: Strength & Muscle Tone: Complain of back pain Gait & Station: normal Patient leans: N/A  Psychiatric: Speech (describe rate, volume, coherence, spontaneity, and abnormalities if any): Clear and coherent with normal tone and volume.  Thought Process (describe rate, content, abstract reasoning, and computation): Logical and goal-directed.  Associations: Coherent, Relevant and Intact  Thoughts: Depressed, rumination.  Mental Status: Orientation: oriented to person, place, time/date and situation Mood & Affect: anxiety and Appropriate affect Attention Span & Concentration: Okay  Medical Decision Making (Choose Three): Review of Psycho-Social Stressors (1), Established Problem, Worsening (2), Review of Last Therapy Session (1), Review of Medication Regimen & Side Effects (2) and Review of New Medication or Change in Dosage (2)  Assessment: Axis I: Anxiety disorder NOS  Axis II: Deferred  Axis III:  Patient Active Problem List   Diagnosis Date Noted  . Back pain 05/05/2012  . Fibromyalgia 05/05/2012  . Chest pressure 04/03/2012  . Carotid bruit 04/03/2012  . Anxiety 10/05/2011    Axis IV: Mild   Axis V: 60-65   Plan: Reassurance given.  Patient does not feel that Klonopin is working.  I will discontinue Klonopin.  We will try Valium 5 mg 3 times a day.  I also recommend to increase Zoloft to 150 mg daily.  Explained risks and benefits.  Recommend to call us back if she has any question or concerns you molesting of the symptom.  Safety plan discussed at any time having active suicidal thoughts on the supplementary to call 911 a local emergency room.  I will see her again in 4 weeks.  Patient scheduled to have blood work with her primary care physician next week.  Recommend to have her current work faxed to [place].  Time spent 25 minutes.  More than 50% of the time spent and psychoeducation counseling and coordination of care.   Jaskiran Pata T.,  MD 10/10/2012

## 2012-11-07 ENCOUNTER — Ambulatory Visit (HOSPITAL_COMMUNITY): Payer: Self-pay | Admitting: Psychiatry

## 2012-11-09 ENCOUNTER — Other Ambulatory Visit (HOSPITAL_COMMUNITY): Payer: Self-pay | Admitting: Psychiatry

## 2012-11-09 ENCOUNTER — Ambulatory Visit (INDEPENDENT_AMBULATORY_CARE_PROVIDER_SITE_OTHER): Payer: Medicare Other | Admitting: Psychiatry

## 2012-11-09 ENCOUNTER — Encounter (HOSPITAL_COMMUNITY): Payer: Self-pay | Admitting: Psychiatry

## 2012-11-09 VITALS — BP 137/74 | HR 90 | Ht 63.0 in | Wt 116.2 lb

## 2012-11-09 DIAGNOSIS — F419 Anxiety disorder, unspecified: Secondary | ICD-10-CM

## 2012-11-09 DIAGNOSIS — F411 Generalized anxiety disorder: Secondary | ICD-10-CM

## 2012-11-09 MED ORDER — SERTRALINE HCL 100 MG PO TABS: ORAL_TABLET | ORAL | Status: DC

## 2012-11-09 MED ORDER — DIAZEPAM 5 MG PO TABS: 5.0000 mg | ORAL_TABLET | Freq: Three times a day (TID) | ORAL | Status: DC

## 2012-11-09 NOTE — Progress Notes (Signed)
[facility] [address] Progress Note  [name] [id number] 67 y.o.  11/09/2012 11:04 AM  Chief Complaint:  I like Valium.  I'm doing better on her medication.      History of Present Illness: Patient is 67 year old Caucasian unemployed female who came for her appointment with her husband.  On her last visit we stopped Klonopin at a started her on Valium 5 mg 3 times a day and increase her Zoloft to 150 mg.  Patient is tolerating her medication and denies any side effects. Patient is doing much better the Valium.  She has only one to 2 panic attack .  She sleeping better.  She still has some anxiety at 5:00 in the evening .  She's prescribed Neurontin by her primary care physician however she does not take it regularly.  Patient denies any crying spells irritability or any insomnia.  Her energy is much better .  Her husband endorsed much improvement of the Valium.  She's not drinking or using any illegal substance.  Suicidal Ideation: No Plan Formed: No Patient has means to carry out plan: No  Homicidal Ideation: No Plan Formed: No Patient has means to carry out plan: No  Review of Systems  Constitutional: Negative.   HENT: Negative.   Musculoskeletal: Positive for back pain.       Back spasms  Neurological: Negative.   Psychiatric/Behavioral: Negative.    Psychiatric: Agitation: No Hallucination: No Depressed Mood: Yes Insomnia: No Hypersomnia: No Altered Concentration: No Feels Worthless: Yes Grandiose Ideas: No Belief In Special Powers: No New/Increased Substance Abuse: No Compulsions: No  Neurologic: Headache: No Seizure: No Paresthesias: No  Past Medical Family, Social History:  Patient has history of hyperlipidemia, sinusitis, seasonal allergies and chronic pain.  She is seeing pain management for her chronic back pain.  She's unemployed.  She lives with her husband is very involved in her treatment.  Patient has a daughter and she is very attached with  her daughter.  Her primary care physician is Dr. [name]  Outpatient Encounter Prescriptions as of 11/09/2012  Medication Sig Dispense Refill  . diazepam (VALIUM) 5 MG tablet Take 1 tablet (5 mg total) by mouth 3 (three) times daily.  90 tablet  1  . fenofibrate 160 MG tablet Take 160 mg by mouth daily.       Marland Kitchen gabapentin (NEURONTIN) 300 MG capsule       . meclizine (ANTIVERT) 25 MG tablet Take 25 mg by mouth 3 (three) times daily as needed. For dizziness      . oxyCODONE-acetaminophen (PERCOCET) 10-325 MG per tablet       . sertraline (ZOLOFT) 100 MG tablet Take 1 and 1/2 tab adily  45 tablet  1  . simvastatin (ZOCOR) 20 MG tablet Take 40 mg by mouth at bedtime.       Marland Kitchen tiZANidine (ZANAFLEX) 2 MG tablet       . [DISCONTINUED] diazepam (VALIUM) 5 MG tablet Take 1 tablet (5 mg total) by mouth 3 (three) times daily.  90 tablet  0  . [DISCONTINUED] sertraline (ZOLOFT) 100 MG tablet Take 1 and 1/2 tab adily  45 tablet  0  . acetaminophen (TYLENOL) 500 MG tablet Take 1,500 mg by mouth every 6 (six) hours as needed. For pain       No facility-administered encounter medications on file as of 11/09/2012.    Past Psychiatric History/Hospitalization(s): Anxiety: Patient has a long history of anxiety disorder.  She was getting her psychiatric medication  from her primary care physician until stopped working.  She has taken Effexor in the past. Bipolar Disorder: No Depression: Yes Mania: No Psychosis: No Schizophrenia: No Personality Disorder: No Hospitalization for psychiatric illness: No History of Electroconvulsive Shock Therapy: No Prior Suicide Attempts: No  Physical Exam: Constitutional:  BP 137/74  Pulse 90  Ht 5\' 3"  (1.6 m)  Wt 116 lb 3.2 oz (52.708 kg)  BMI 20.59 kg/m2  General Appearance: well nourished and Tired.  She is casually dressed.  Musculoskeletal: Strength & Muscle Tone: Complain of back pain Gait & Station: normal Patient leans: N/A  Psychiatric: Speech (describe  rate, volume, coherence, spontaneity, and abnormalities if any): Clear and coherent with normal tone and volume.  Thought Process (describe rate, content, abstract reasoning, and computation): Logical and goal-directed.  Associations: Coherent, Relevant and Intact  Thoughts: normal  Mental Status: Orientation: oriented to person, place, time/date and situation Mood & Affect: anxiety and Appropriate affect Attention Span & Concentration: Okay  Medical Decision Making (Choose Three): Established Problem, Stable/Improving (1), Review of Psycho-Social Stressors (1), Review of Last Therapy Session (1) and Review of New Medication or Change in Dosage (2)  Assessment: Axis I: Anxiety disorder NOS  Axis II: Deferred  Axis III:  Patient Active Problem List   Diagnosis Date Noted  . Back pain 05/05/2012  . Fibromyalgia 05/05/2012  . Chest pressure 04/03/2012  . Carotid bruit 04/03/2012  . Anxiety 10/05/2011    Axis IV: Mild   Axis V: 60-65   Plan: I will continue Valium 5 mg 3 times a day and Zoloft 150 mg daily.  I recommend to use Neurontin every day at 5:00 to help the residual anxiety.  Recommend to call us back if she has any question or concern if she feels worsening of the symptom.  I will see her again in 2 months.   Visente Kirker T., MD 11/09/2012

## 2013-01-02 ENCOUNTER — Ambulatory Visit (HOSPITAL_COMMUNITY): Payer: Self-pay | Admitting: Psychiatry

## 2013-01-03 ENCOUNTER — Ambulatory Visit (INDEPENDENT_AMBULATORY_CARE_PROVIDER_SITE_OTHER): Payer: Medicare Other | Admitting: Psychiatry

## 2013-01-03 ENCOUNTER — Encounter (HOSPITAL_COMMUNITY): Payer: Self-pay | Admitting: Psychiatry

## 2013-01-03 VITALS — BP 148/61 | HR 90 | Wt 115.0 lb

## 2013-01-03 DIAGNOSIS — F419 Anxiety disorder, unspecified: Secondary | ICD-10-CM

## 2013-01-03 DIAGNOSIS — F411 Generalized anxiety disorder: Secondary | ICD-10-CM

## 2013-01-03 MED ORDER — DIAZEPAM 5 MG PO TABS: ORAL_TABLET | ORAL | Status: DC

## 2013-01-03 MED ORDER — HYDROXYZINE PAMOATE 25 MG PO CAPS: 25.0000 mg | ORAL_CAPSULE | Freq: Two times a day (BID) | ORAL | Status: DC | PRN

## 2013-01-03 MED ORDER — SERTRALINE HCL 100 MG PO TABS: ORAL_TABLET | ORAL | Status: DC

## 2013-01-03 NOTE — Progress Notes (Signed)
[facility] [address] Progress Note  [name] [id number] 67 y.o.  01/03/2013 2:06 PM  Chief Complaint:  I'm still very anxious.  I continue to have panic attack.        History of Present Illness: Patient is 67 year old Caucasian unemployed female who came for her appointment with her husband.  Patient continues to have panic attack and nervousness.  She endorsed depressive thoughts but denies any active or passive suicidal thoughts or homicidal thoughts.  Her husband endorsed much improvement in her overall anxiety but she continues to have episodic panic attack which last more than usual.  She sleeping on and off.  She is not taking Neurontin  she is taking Flexeril muscle accident.  She's not drinking or using any illegal substances.  She is not interested in counseling or therapy.  Suicidal Ideation: No Plan Formed: No Patient has means to carry out plan: No  Homicidal Ideation: No Plan Formed: No Patient has means to carry out plan: No  Review of Systems  Constitutional: Negative.   HENT: Negative.   Musculoskeletal: Positive for back pain.       Back spasms  Neurological: Negative.   Psychiatric/Behavioral: Negative.    Psychiatric: Agitation: No Hallucination: No Depressed Mood: Yes Insomnia: No Hypersomnia: No Altered Concentration: No Feels Worthless: Yes Grandiose Ideas: No Belief In Special Powers: No New/Increased Substance Abuse: No Compulsions: No  Neurologic: Headache: No Seizure: No Paresthesias: No  Past Medical Family, Social History:  Patient has history of hyperlipidemia, sinusitis, seasonal allergies and chronic pain.  She is seeing pain management for her chronic back pain.  She's unemployed.  She lives with her husband is very involved in her treatment.  Patient has a daughter and she is very attached with her daughter.  Her primary care physician is Dr. [name]  Outpatient Encounter Prescriptions as of 01/03/2013  Medication Sig  Dispense Refill  . acetaminophen (TYLENOL) 500 MG tablet Take 1,500 mg by mouth every 6 (six) hours as needed. For pain      . cyclobenzaprine (FLEXERIL) 10 MG tablet Take 10 mg by mouth 3 (three) times daily as needed.      . diazepam (VALIUM) 5 MG tablet Take 1 tab 4 times a day  120 tablet  1  . fenofibrate 160 MG tablet Take 160 mg by mouth daily.       . meclizine (ANTIVERT) 25 MG tablet Take 25 mg by mouth 3 (three) times daily as needed. For dizziness      . oxyCODONE-acetaminophen (PERCOCET) 10-325 MG per tablet       . sertraline (ZOLOFT) 100 MG tablet Take 1 and 1/2 tab adily  45 tablet  1  . simvastatin (ZOCOR) 20 MG tablet Take 40 mg by mouth at bedtime.       . [DISCONTINUED] diazepam (VALIUM) 5 MG tablet Take 1 tablet (5 mg total) by mouth 3 (three) times daily.  90 tablet  1  . [DISCONTINUED] diazepam (VALIUM) 5 MG tablet Take 1 tab 4 times a day  120 tablet  1  . [DISCONTINUED] diazepam (VALIUM) 5 MG tablet Take 1 tab 4 times a day  120 tablet  1  . [DISCONTINUED] diazepam (VALIUM) 5 MG tablet Take 1 tab 4 times a day  120 tablet  1  . [DISCONTINUED] sertraline (ZOLOFT) 100 MG tablet Take 1 and 1/2 tab adily  45 tablet  1  . hydrOXYzine (VISTARIL) 25 MG capsule Take 1 capsule (25 mg total) by  mouth 2 (two) times daily as needed for anxiety.  60 capsule  1  . [DISCONTINUED] gabapentin (NEURONTIN) 300 MG capsule       . [DISCONTINUED] tiZANidine (ZANAFLEX) 2 MG tablet        No facility-administered encounter medications on file as of 01/03/2013.    Past Psychiatric History/Hospitalization(s): Anxiety: Patient has a long history of anxiety disorder.  She was getting her psychiatric medication from her primary care physician until stopped working.  She has taken Effexor in the past. Bipolar Disorder: No Depression: Yes Mania: No Psychosis: No Schizophrenia: No Personality Disorder: No Hospitalization for psychiatric illness: No History of Electroconvulsive Shock Therapy:  No Prior Suicide Attempts: No  Physical Exam: Constitutional:  BP 148/61  Pulse 90  Wt 115 lb (52.164 kg)  BMI 20.38 kg/m2  General Appearance: well nourished and Tired.  She is casually dressed.  Musculoskeletal: Strength & Muscle Tone: Complain of back pain Gait & Station: normal Patient leans: N/A  Psychiatric: Speech (describe rate, volume, coherence, spontaneity, and abnormalities if any): Clear and coherent with normal tone and volume.  Thought Process (describe rate, content, abstract reasoning, and computation): Logical and goal-directed.  Associations: Coherent, Relevant and Intact  Thoughts: normal  Mental Status: Orientation: oriented to person, place, time/date and situation Mood & Affect: anxiety and Appropriate affect Attention Span & Concentration: Okay  Medical Decision Making (Choose Three): Established Problem, Stable/Improving (1), Review of Psycho-Social Stressors (1), Established Problem, Worsening (2), Review of Last Therapy Session (1), Review of Medication Regimen & Side Effects (2) and Review of New Medication or Change in Dosage (2)  Assessment: Axis I: Anxiety disorder NOS  Axis II: Deferred  Axis III:  Patient Active Problem List   Diagnosis Date Noted  . Back pain 05/05/2012  . Fibromyalgia 05/05/2012  . Chest pressure 04/03/2012  . Carotid bruit 04/03/2012  . Anxiety 10/05/2011    Axis IV: Mild   Axis V: 60-65   Plan: I will  increase Valium 5 mg 4 times a day and continue Zoloft 150 mg daily.  I will add Vistaril 25 mg 1-2 capsule as needed for anxiety and insomnia.  This is indicative of his combative some medication.  Recommend call us back if his request and consent portion of the symptom.  I will see him in 2 months. Time spend 25 min. more than 50% of the time spent his education, counseling and coordination of care.  Aino Heckert T., MD 01/03/2013

## 2013-01-11 ENCOUNTER — Ambulatory Visit (HOSPITAL_COMMUNITY): Payer: Self-pay | Admitting: Psychiatry

## 2013-01-23 ENCOUNTER — Ambulatory Visit (HOSPITAL_COMMUNITY): Payer: Self-pay | Admitting: Psychiatry

## 2013-03-06 ENCOUNTER — Encounter: Payer: Self-pay | Admitting: *Deleted

## 2013-03-06 ENCOUNTER — Ambulatory Visit (INDEPENDENT_AMBULATORY_CARE_PROVIDER_SITE_OTHER): Payer: Medicare Other | Admitting: Psychiatry

## 2013-03-06 ENCOUNTER — Encounter (HOSPITAL_COMMUNITY): Payer: Self-pay | Admitting: Psychiatry

## 2013-03-06 VITALS — BP 122/60 | HR 84 | Ht 63.0 in | Wt 119.2 lb

## 2013-03-06 DIAGNOSIS — F419 Anxiety disorder, unspecified: Secondary | ICD-10-CM

## 2013-03-06 DIAGNOSIS — F411 Generalized anxiety disorder: Secondary | ICD-10-CM

## 2013-03-06 MED ORDER — SERTRALINE HCL 100 MG PO TABS: ORAL_TABLET | ORAL | Status: DC

## 2013-03-06 MED ORDER — DIAZEPAM 5 MG PO TABS: ORAL_TABLET | ORAL | Status: DC

## 2013-03-06 NOTE — Progress Notes (Signed)
[facility] [address] Progress Note  [name] [id number] 67 y.o.  03/06/2013 11:00 AM  Chief Complaint:  I'm still having panic attack.          History of Present Illness: Patient is 67 year old Caucasian unemployed female who came for her appointment with her husband.  Patient continues to have panic attack and nervousness.  On her last visit we increase Valium 5 mg 4 times a day but patient does not see any improvement.  We also recommended this to however patient stopped taking because she does not see any improvement.  She continues to have 3-4 panic attacks a day.  She endorse racing thoughts and sometime having difficulty sleeping.  Patient does not know what triggers a panic attack she feels tremulous, shortness of breath, feeling of dying and extremely nervous.  She denies any active or passive suicidal thought or homicidal thought.  Recently she visited primary care physician Dr. [name] and her blood work drawn.  She brought blood work results.  Her RBC is 3.83 and hematocrit 35.3 .  Otherwise she has a normal CBC basic chemistry and liver enzymes.  Her cholesterol is 185 and triglyceride 94.  Patient is not bringing her using and a little substance.  She is not interested in counseling.    Suicidal Ideation: No Plan Formed: No Patient has means to carry out plan: No  Homicidal Ideation: No Plan Formed: No Patient has means to carry out plan: No  Review of Systems  Constitutional: Negative.   HENT: Negative.   Musculoskeletal: Positive for back pain.       Back spasms  Neurological: Negative.   Psychiatric/Behavioral: Negative.    Psychiatric: Agitation: No Hallucination: No Depressed Mood: Yes Insomnia: No Hypersomnia: No Altered Concentration: No Feels Worthless: Yes Grandiose Ideas: No Belief In Special Powers: No New/Increased Substance Abuse: No Compulsions: No  Neurologic: Headache: No Seizure: No Paresthesias: No  Past Medical Family, Social  History:  Patient has history of hyperlipidemia, sinusitis, seasonal allergies and chronic pain.  She is seeing pain management for her chronic back pain.  She's unemployed.  She lives with her husband is very involved in her treatment.  Patient has a daughter and she is very attached with her daughter.  Her primary care physician is Dr. [name]  Outpatient Encounter Prescriptions as of 03/06/2013  Medication Sig Dispense Refill  . cyclobenzaprine (FLEXERIL) 10 MG tablet Take 10 mg by mouth 3 (three) times daily as needed.      . diazepam (VALIUM) 5 MG tablet Take 1 tab 3 times a day and 2 at bed time  150 tablet  1  . fenofibrate 160 MG tablet Take 160 mg by mouth daily.       . meclizine (ANTIVERT) 25 MG tablet Take 25 mg by mouth 3 (three) times daily as needed. For dizziness      . Oxycodone HCl 10 MG TABS       . sertraline (ZOLOFT) 100 MG tablet Take 2 tab daily  60 tablet  1  . simvastatin (ZOCOR) 20 MG tablet Take 40 mg by mouth at bedtime.       . [DISCONTINUED] diazepam (VALIUM) 5 MG tablet Take 1 tab 4 times a day  120 tablet  1  . [DISCONTINUED] sertraline (ZOLOFT) 100 MG tablet Take 1 and 1/2 tab adily  45 tablet  1  . [DISCONTINUED] acetaminophen (TYLENOL) 500 MG tablet Take 1,500 mg by mouth every 6 (six) hours as needed. For  pain      . [DISCONTINUED] hydrOXYzine (VISTARIL) 25 MG capsule Take 1 capsule (25 mg total) by mouth 2 (two) times daily as needed for anxiety.  60 capsule  1  . [DISCONTINUED] oxyCODONE-acetaminophen (PERCOCET) 10-325 MG per tablet        No facility-administered encounter medications on file as of 03/06/2013.    Past Psychiatric History/Hospitalization(s): Anxiety: Patient has a long history of anxiety disorder.  She was getting her psychiatric medication from her primary care physician until stopped working.  She has taken Effexor in the past. Bipolar Disorder: No Depression: Yes Mania: No Psychosis: No Schizophrenia: No Personality Disorder:  No Hospitalization for psychiatric illness: No History of Electroconvulsive Shock Therapy: No Prior Suicide Attempts: No  Physical Exam: Constitutional:  BP 122/60  Pulse 84  Ht 5\' 3"  (1.6 m)  Wt 119 lb 3.2 oz (54.069 kg)  BMI 21.12 kg/m2  General Appearance: well nourished and Tired.  She is casually dressed.  Musculoskeletal: Strength & Muscle Tone: Complain of back pain Gait & Station: normal Patient leans: N/A  Psychiatric: Speech (describe rate, volume, coherence, spontaneity, and abnormalities if any): Clear and coherent with normal tone and volume.  Thought Process (describe rate, content, abstract reasoning, and computation): Logical and goal-directed.  Associations: Coherent, Relevant and Intact  Thoughts: normal  Mental Status: Orientation: oriented to person, place, time/date and situation Mood & Affect: anxiety and Appropriate affect Attention Span & Concentration: Okay  Medical Decision Making (Choose Three): Established Problem, Stable/Improving (1), Review of Psycho-Social Stressors (1), Established Problem, Worsening (2), Review of Last Therapy Session (1), Review of Medication Regimen & Side Effects (2) and Review of New Medication or Change in Dosage (2)  Assessment: Axis I: Anxiety disorder NOS  Axis II: Deferred  Axis III:  Patient Active Problem List   Diagnosis Date Noted  . Back pain 05/05/2012  . Fibromyalgia 05/05/2012  . Chest pressure 04/03/2012  . Carotid bruit 04/03/2012  . Anxiety 10/05/2011    Axis IV: Mild   Axis V: 60-65   Plan: I review psychosocial stressors current blood work results and medication.  I will  increase Valium 5 mg 3 times a day and 2 at bedtime and also increase Zoloft 200 mg daily.  I had a long discussion about the medication side effects and efficacy.  If this does not work we will consider switching antidepressant .  Recommend to call us back if she has any question or concern.  I will see her again in 2  months.  Time spend 25 min. more than 50% of the time spent his education, counseling and coordination of care.  [name] T., MD 03/06/2013

## 2013-05-07 ENCOUNTER — Encounter (HOSPITAL_COMMUNITY): Payer: Self-pay | Admitting: Psychiatry

## 2013-05-07 ENCOUNTER — Encounter (INDEPENDENT_AMBULATORY_CARE_PROVIDER_SITE_OTHER): Payer: Self-pay

## 2013-05-07 ENCOUNTER — Ambulatory Visit (INDEPENDENT_AMBULATORY_CARE_PROVIDER_SITE_OTHER): Payer: No Typology Code available for payment source | Admitting: Psychiatry

## 2013-05-07 VITALS — BP 133/81 | HR 83 | Ht 63.0 in | Wt 122.6 lb

## 2013-05-07 DIAGNOSIS — F419 Anxiety disorder, unspecified: Secondary | ICD-10-CM

## 2013-05-07 DIAGNOSIS — F411 Generalized anxiety disorder: Secondary | ICD-10-CM

## 2013-05-07 MED ORDER — DIAZEPAM 5 MG PO TABS: ORAL_TABLET | ORAL | Status: DC

## 2013-05-07 MED ORDER — SERTRALINE HCL 100 MG PO TABS: ORAL_TABLET | ORAL | Status: DC

## 2013-05-07 NOTE — Progress Notes (Signed)
[facility] [address] Progress Note  [name] [id number] 67 y.o.  05/07/2013 11:26 AM  Chief Complaint:  Medication management and followup.            History of Present Illness: [name] came for her followup appointment with her husband.  She is doing much better on her current psychotropic medication.  She denies any recent panic attack.  Her anxiety is less intense and less frequent.  She is sleeping better.  She is thinking to see a new pain doctor in  [place] area.  Our current pain specialist is in [place].  She does not like traveling too [facility] for her pain management however she does not have a choice.  She is wondering if the new doctor can help her for her pain.  She is taking oxycodone and muscle relaxants.  She denies any side effects of medication.  She is not drinking or using any illegal substances.  She denies any paranoia, crying spells, agitation or any hallucination.  Her primary care physician is Dr. [name].  She is excited about Thanksgiving.  She is planning to spend Thanksgiving with her sister.  Suicidal Ideation: No Plan Formed: No Patient has means to carry out plan: No  Homicidal Ideation: No Plan Formed: No Patient has means to carry out plan: No  Review of Systems  Musculoskeletal: Positive for back pain.       Back spasms  Neurological: Negative.   Psychiatric/Behavioral: Negative.    Psychiatric: Agitation: No Hallucination: No Depressed Mood: Yes Insomnia: No Hypersomnia: No Altered Concentration: No Feels Worthless: Yes Grandiose Ideas: No Belief In Special Powers: No New/Increased Substance Abuse: No Compulsions: No  Neurologic: Headache: No Seizure: No Paresthesias: No  Past Medical Family, Social History:  Patient has history of hyperlipidemia, sinusitis, seasonal allergies and chronic pain.  She is seeing pain management for her chronic back pain.  She's unemployed.  She lives with her husband is very  involved in her treatment.  Patient has a daughter and she is very attached with her daughter.  Her primary care physician is Dr. [name]  Outpatient Encounter Prescriptions as of 05/07/2013  Medication Sig  . cyclobenzaprine (FLEXERIL) 10 MG tablet Take 10 mg by mouth 3 (three) times daily as needed.  . diazepam (VALIUM) 5 MG tablet Take 1 tab 3 times a day and 2 at bed time  . fenofibrate 160 MG tablet Take 160 mg by mouth daily.   . Oxycodone HCl 10 MG TABS   . sertraline (ZOLOFT) 100 MG tablet Take 2 tab daily  . simvastatin (ZOCOR) 20 MG tablet Take 40 mg by mouth at bedtime.   . [DISCONTINUED] diazepam (VALIUM) 5 MG tablet Take 1 tab 3 times a day and 2 at bed time  . [DISCONTINUED] sertraline (ZOLOFT) 100 MG tablet Take 2 tab daily  . meclizine (ANTIVERT) 25 MG tablet Take 25 mg by mouth 3 (three) times daily as needed. For dizziness    Past Psychiatric History/Hospitalization(s): Patient has a long history of anxiety disorder.  She was getting her psychiatric medication from her primary care physician until stopped working.  She has taken Effexor in the past. Bipolar Disorder: No Depression: Yes Mania: No Psychosis: No Schizophrenia: No Personality Disorder: No Hospitalization for psychiatric illness: No History of Electroconvulsive Shock Therapy: No Prior Suicide Attempts: No  Physical Exam: Constitutional:  BP 133/81  Pulse 83  Ht 5\' 3"  (1.6 m)  Wt 122 lb 9.6 oz (55.611 kg)  BMI 21.72  kg/m2  General Appearance: alert, oriented, no acute distress and well nourished  Musculoskeletal: Strength & Muscle Tone: Complain of back pain Gait & Station: normal Patient leans: N/A  Psychiatric: Speech (describe rate, volume, coherence, spontaneity, and abnormalities if any): Clear and coherent with normal tone and volume.  Thought Process (describe rate, content, abstract reasoning, and computation): Logical and goal-directed.  Associations: Coherent, Relevant and  Intact  Thoughts: normal  Mental Status: Orientation: oriented to person, place, time/date and situation Mood & Affect: anxiety and Appropriate affect Attention Span & Concentration: Okay  Medical Decision Making (Choose Three): Established Problem, Stable/Improving (1), Review of Psycho-Social Stressors (1), Review of Last Therapy Session (1) and Review of Medication Regimen & Side Effects (2)  Assessment: Axis I: Anxiety disorder NOS  Axis II: Deferred  Axis III:  Patient Active Problem List   Diagnosis Date Noted  . Back pain 05/05/2012  . Fibromyalgia 05/05/2012  . Chest pressure 04/03/2012  . Carotid bruit 04/03/2012  . Anxiety 10/05/2011    Axis IV: Mild   Axis V: 60-65   Plan: Patient is doing better on her current psychotropic medication.  I will continue Valium 5 mg 3 times a day and 2 at bedtime.  I will continue Zoloft 200 mg daily.  Recommend to call us back if she is any question of a concern.  Followup in 3 months.  Reassurance given.  Explained if she like the new pain doctor that she can request the record from [place].    [name] T., MD 05/07/2013

## 2013-07-16 ENCOUNTER — Telehealth (HOSPITAL_COMMUNITY): Payer: Self-pay | Admitting: *Deleted

## 2013-07-16 NOTE — Telephone Encounter (Signed)
[id number] will only allow FOUR Valium 5 mg a day, not #5. Souse states pt is doing better than she has in a long time and he does not want to see her have to change medication. Contacted spouse - Spouse gave Insurance phone # [phone]. Advised spouse that MD will be given information. Last filled  RX on 07/04/13 for 3150, per insurance company, last time they will allow fill over #120/month.  Per spouse, will need refill 08/03/13 before next appt on2/25/15

## 2013-07-17 ENCOUNTER — Other Ambulatory Visit (HOSPITAL_COMMUNITY): Payer: Self-pay | Admitting: *Deleted

## 2013-07-17 DIAGNOSIS — F411 Generalized anxiety disorder: Secondary | ICD-10-CM

## 2013-07-17 MED ORDER — DIAZEPAM 10 MG PO TABS: ORAL_TABLET | ORAL | Status: DC

## 2013-07-17 NOTE — Telephone Encounter (Signed)
07/16/13 @ 1521: [id number] will only allow FOUR Valium 5 mg a day, not #5. Souse states pt is doing better than she has in a long time and he does not want to see her have to change medication.  Contacted spouse - Spouse gave Insurance phone # [phone]. Advised spouse that MD will be given information. Last filled RX on 07/04/13 for 3150, per insurance company, last time they will allow fill over #120/month.  Per spouse, will need refill 08/03/13 before next appt on2/25/15  Per Dr. Venia MinksArfeen-order Valium 10 mg, #45, take 1/2 tablet (5 mg) three times a day and 1 tablet (10 mg) at bedtime. Advised pt's husband of this and he states this should work.Will call RX in to pharmacy at this time, and ask for itto be put on hold until appropriate time to refill.

## 2013-08-02 ENCOUNTER — Other Ambulatory Visit (HOSPITAL_COMMUNITY): Payer: Self-pay | Admitting: Anesthesiology

## 2013-08-02 DIAGNOSIS — M542 Cervicalgia: Secondary | ICD-10-CM

## 2013-08-02 DIAGNOSIS — M5030 Other cervical disc degeneration, unspecified cervical region: Secondary | ICD-10-CM

## 2013-08-08 ENCOUNTER — Ambulatory Visit (INDEPENDENT_AMBULATORY_CARE_PROVIDER_SITE_OTHER): Payer: No Typology Code available for payment source | Admitting: Psychiatry

## 2013-08-08 ENCOUNTER — Encounter (HOSPITAL_COMMUNITY): Payer: Self-pay | Admitting: Psychiatry

## 2013-08-08 VITALS — BP 155/83 | HR 103 | Ht 63.0 in | Wt 127.0 lb

## 2013-08-08 DIAGNOSIS — F419 Anxiety disorder, unspecified: Secondary | ICD-10-CM

## 2013-08-08 DIAGNOSIS — F411 Generalized anxiety disorder: Secondary | ICD-10-CM

## 2013-08-08 MED ORDER — DIAZEPAM 10 MG PO TABS: ORAL_TABLET | ORAL | Status: DC

## 2013-08-08 MED ORDER — SERTRALINE HCL 100 MG PO TABS: ORAL_TABLET | ORAL | Status: DC

## 2013-08-08 NOTE — Progress Notes (Signed)
[facility] [id number] Progress Note  [name] [id number] 68 y.o.  08/08/2013 11:22 AM  Chief Complaint:  Medication management and followup.            History of Present Illness: [name] came for her followup appointment with her husband.  She is doing better on her current psychotropic medication.  She has two panic attack which are less intense and less frequent.  Patient and her husband are concerned about financial strain.  Patient is seeing a specialist [facility] but they are not happy because she has to come every month which causes a lot of financial strain.  Patient told that clinic is doing every time breathalyzer, UDS which is unnecessary and causes financial burden to them.  Recently her insurance refused to pay Flexeril.  Patient is scheduled to see her pain specialist tomorrow and she will discussed to get authorized.  Patient is also wondering if she can see Dr Sue Lushankuwa pain [place] in [place] because of close to home .  Overall she is stable on her current psychotropic medication.  She is taking Valium 10 mg half tablet 3 times a day and one at bedtime .  She is taking Zoloft.  She has gained weight from the past however her sleep is unchanged from the past.  She sleeping better.  She denies any crying spells, irritability, anger or any mood swings.  She is not drinking or using any illegal substances.    Suicidal Ideation: No Plan Formed: No Patient has means to carry out plan: No  Homicidal Ideation: No Plan Formed: No Patient has means to carry out plan: No  Review of Systems  Musculoskeletal: Positive for back pain.       Back spasms  Neurological: Negative.    Psychiatric: Agitation: No Hallucination: No Depressed Mood: Yes Insomnia: No Hypersomnia: No Altered Concentration: No Feels Worthless: Yes Grandiose Ideas: No Belief In Special Powers: No New/Increased Substance Abuse: No Compulsions: No  Neurologic: Headache:  No Seizure: No Paresthesias: No  Past Medical Family, Social History:  Patient has history of hyperlipidemia, sinusitis, seasonal allergies and chronic pain.  She is seeing pain management for her chronic back pain.  She's unemployed.  She lives with her husband is very involved in her treatment.  Patient has a daughter and she is very attached with her daughter.  Her primary care physician is Dr. Margo AyeHall  Outpatient Encounter Prescriptions as of 08/08/2013  Medication Sig  . butalbital-acetaminophen-caffeine (FIORICET, ESGIC) 50-325-40 MG per tablet Take 2 tablets by mouth 2 (two) times daily as needed for headache or migraine.  . cyclobenzaprine (FLEXERIL) 10 MG tablet Take 10 mg by mouth 3 (three) times daily as needed.  . diazepam (VALIUM) 10 MG tablet Take 1/2 tablet (5 mg) three times a day and 1 tablet (10 mg) at bedtime.  . fenofibrate 160 MG tablet Take 160 mg by mouth daily.   . Oxycodone HCl 10 MG TABS   . sertraline (ZOLOFT) 100 MG tablet Take 2 tab daily  . simvastatin (ZOCOR) 20 MG tablet Take 40 mg by mouth at bedtime.   . [DISCONTINUED] diazepam (VALIUM) 10 MG tablet Take 1/2 tablet (5 mg) three times a day and 1 tablet (10 mg) at bedtime.  . [DISCONTINUED] meclizine (ANTIVERT) 25 MG tablet Take 25 mg by mouth 3 (three) times daily as needed. For dizziness  . [DISCONTINUED] sertraline (ZOLOFT) 100 MG tablet Take 2 tab daily    Past Psychiatric History/Hospitalization(s): Patient  has a long history of anxiety disorder.  She was getting her psychiatric medication from her primary care physician until stopped working.  She has taken Effexor in the past. Bipolar Disorder: No Depression: Yes Mania: No Psychosis: No Schizophrenia: No Personality Disorder: No Hospitalization for psychiatric illness: No History of Electroconvulsive Shock Therapy: No Prior Suicide Attempts: No  Physical Exam: Constitutional:  BP 155/83  Pulse 103  Ht 5\' 3"  (1.6 m)  Wt 127 lb (57.607 kg)  BMI  22.50 kg/m2  General Appearance: alert, oriented, no acute distress and well nourished  Musculoskeletal: Strength & Muscle Tone: Complain of back pain Gait & Station: normal Patient leans: N/A  Psychiatric: Speech (describe rate, volume, coherence, spontaneity, and abnormalities if any): Clear and coherent with normal tone and volume.  Thought Process (describe rate, content, abstract reasoning, and computation): Logical and goal-directed.  Associations: Coherent, Relevant and Intact  Thoughts: normal  Mental Status: Orientation: oriented to person, place, time/date and situation Mood & Affect: anxiety and Appropriate affect Attention Span & Concentration: Okay  Medical Decision Making (Choose Three): Established Problem, Stable/Improving (1), Review of Psycho-Social Stressors (1), Review of Last Therapy Session (1) and Review of Medication Regimen & Side Effects (2)  Assessment: Axis I: Anxiety disorder NOS  Axis II: Deferred  Axis III:  Patient Active Problem List   Diagnosis Date Noted  . Back pain 05/05/2012  . Fibromyalgia 05/05/2012  . Chest pressure 04/03/2012  . Carotid bruit 04/03/2012  . Anxiety 10/05/2011    Axis IV: Mild   Axis V: 60-65   Plan: I encourage her to get opinion with Dr. Purcell Mouton who is closer to her residence.  I recommended not to cancel appointment with [name] clinic until she is satisfied with Dr Purcell Mouton. Patient is doing better on her current psychotropic medication.  I will continue Valium 10 mg half tablet 3 times a day and one at bedtime.  I will continue Zoloft 200 mg daily.  Recommend to call us back if she is any question of a concern.  Followup in 3 months.  Reassurance given.   Jermia Rigsby T., MD 08/08/2013

## 2013-08-17 ENCOUNTER — Ambulatory Visit (HOSPITAL_COMMUNITY): Admission: RE | Admit: 2013-08-17 | Payer: Medicare Other | Source: Ambulatory Visit

## 2013-11-06 ENCOUNTER — Ambulatory Visit (INDEPENDENT_AMBULATORY_CARE_PROVIDER_SITE_OTHER): Payer: No Typology Code available for payment source | Admitting: Psychiatry

## 2013-11-06 ENCOUNTER — Encounter (HOSPITAL_COMMUNITY): Payer: Self-pay | Admitting: Psychiatry

## 2013-11-06 VITALS — BP 141/83 | HR 93 | Ht 63.0 in | Wt 134.6 lb

## 2013-11-06 DIAGNOSIS — F419 Anxiety disorder, unspecified: Secondary | ICD-10-CM

## 2013-11-06 DIAGNOSIS — F411 Generalized anxiety disorder: Secondary | ICD-10-CM

## 2013-11-06 MED ORDER — DIAZEPAM 10 MG PO TABS: ORAL_TABLET | ORAL | Status: DC

## 2013-11-06 MED ORDER — SERTRALINE HCL 100 MG PO TABS: ORAL_TABLET | ORAL | Status: DC

## 2013-11-06 NOTE — Progress Notes (Signed)
[facility] [id number] Progress Note  [name] [id number] 68 y.o.  11/06/2013 11:51 AM  Chief Complaint:  Medication management and followup.            History of Present Illness: [name] came for her followup appointment with her husband.  She is compliant with her psychotropic medication.  Recently she see her primary care physician Dr. Dwana MelenaZack Hall and given levothyroxine for hypothyroidism.  She is also taking lyrica for back pain.  She is concerned about her weight gain.  She has gained 7 pounds since she stopped taking Lyrica .  Otherwise she is doing very well.  She denies any agitation, anger, mood swing.  Her sleep is good.  She has 2 minor anxiety spells .  She feels her current medicine is working very well.  She has a blood work done on March 3rd, 2015.  Her CBC is normal.  A1c is 5.8.  Her comprehensive metabolic panel is normal.  Her TSH is 5.512.  Lipid panel was normal.  Patient denies any agitation, anger or any mood swing.  She is not drinking or using any illegal substances.  She is seeing Dr. Sue Lushankuwa for chronic pain.  She is taking oxycodone.  Her appetite is okay.  Her vitals are stable, however she has gained 7 pounds from the past.  Suicidal Ideation: No Plan Formed: No Patient has means to carry out plan: No  Homicidal Ideation: No Plan Formed: No Patient has means to carry out plan: No  Review of Systems  Musculoskeletal: Positive for back pain.       Back spasms  Neurological: Negative.    Psychiatric: Agitation: No Hallucination: No Depressed Mood: No Insomnia: No Hypersomnia: No Altered Concentration: No Feels Worthless: No Grandiose Ideas: No Belief In Special Powers: No New/Increased Substance Abuse: No Compulsions: No  Neurologic: Headache: No Seizure: No Paresthesias: No  Past Medical Family, Social History:  Patient has history of hyperlipidemia, sinusitis, hypothyroidism, seasonal allergies and chronic pain.  She is seeing  pain management for her chronic back pain.  She's unemployed.  She lives with her husband is very involved in her treatment.  Patient has a daughter and she is very attached with her daughter.  Her primary care physician is Dr. Margo AyeHall  Outpatient Encounter Prescriptions as of 11/06/2013  Medication Sig  . butalbital-acetaminophen-caffeine (FIORICET, ESGIC) 50-325-40 MG per tablet Take 2 tablets by mouth 2 (two) times daily as needed for headache or migraine.  . cyclobenzaprine (FLEXERIL) 10 MG tablet Take 10 mg by mouth 3 (three) times daily as needed.  . diazepam (VALIUM) 10 MG tablet Take 1/2 tablet (5 mg) three times a day and 1 tablet (10 mg) at bedtime.  . fenofibrate 160 MG tablet Take 160 mg by mouth daily.   Marland Kitchen. levothyroxine (SYNTHROID, LEVOTHROID) 25 MCG tablet   . Oxycodone HCl 10 MG TABS   . sertraline (ZOLOFT) 100 MG tablet Take 2 tab daily  . simvastatin (ZOCOR) 20 MG tablet Take 40 mg by mouth at bedtime.   . [DISCONTINUED] diazepam (VALIUM) 10 MG tablet Take 1/2 tablet (5 mg) three times a day and 1 tablet (10 mg) at bedtime.  . [DISCONTINUED] sertraline (ZOLOFT) 100 MG tablet Take 2 tab daily  . LYRICA 25 MG capsule   . [DISCONTINUED] tiZANidine (ZANAFLEX) 4 MG tablet     Past Psychiatric History/Hospitalization(s): Patient has a long history of anxiety disorder.  She was getting her psychiatric medication from her primary care physician  until stopped working.  She has taken Effexor in the past. Bipolar Disorder: No Depression: Yes Mania: No Psychosis: No Schizophrenia: No Personality Disorder: No Hospitalization for psychiatric illness: No History of Electroconvulsive Shock Therapy: No Prior Suicide Attempts: No  Physical Exam: Constitutional:  BP 141/83  Pulse 93  Ht 5\' 3"  (1.6 m)  Wt 134 lb 9.6 oz (61.054 kg)  BMI 23.85 kg/m2  General Appearance: alert, oriented, no acute distress and well nourished  Musculoskeletal: Strength & Muscle Tone: Complain of back  pain Gait & Station: normal Patient leans: N/A  Psychiatric: Speech (describe rate, volume, coherence, spontaneity, and abnormalities if any): Clear and coherent with normal tone and volume.  Thought Process (describe rate, content, abstract reasoning, and computation): Logical and goal-directed.  Associations: Coherent, Relevant and Intact  Thoughts: normal  Mental Status: Orientation: oriented to person, place, time/date and situation Mood & Affect: anxiety and Appropriate affect Attention Span & Concentration: Okay  Established Problem, Stable/Improving (1), Review of Psycho-Social Stressors (1), Review or order clinical lab tests (1), Review of Last Therapy Session (1) and Review of Medication Regimen & Side Effects (2)  Assessment: Axis I: Anxiety disorder NOS  Axis II: Deferred  Axis III:  Patient Active Problem List   Diagnosis Date Noted  . Back pain 05/05/2012  . Fibromyalgia 05/05/2012  . Chest pressure 04/03/2012  . Carotid bruit 04/03/2012  . Anxiety 10/05/2011    Axis IV: Mild   Axis V: 60-65   Plan: I will continue Valium 10 mg half tablet 3 times a day and one at bedtime.  She will also continue on Zoloft 200 mg daily.  I believe she has gained weight because of Lyrica which was started recently.  I recommend to discuss the side effects of the medication with her pain specialist .  I also reviewed her blood work results.  Recommended to call us back if she has any question or a concern.  Patient is on any side effects of medication.  Followup in 3 months.  ARFEEN,SYED T., MD 11/06/2013

## 2013-11-07 ENCOUNTER — Ambulatory Visit (HOSPITAL_COMMUNITY): Payer: Self-pay | Admitting: Psychiatry

## 2013-11-08 ENCOUNTER — Ambulatory Visit (HOSPITAL_COMMUNITY): Payer: Self-pay | Admitting: Psychiatry

## 2013-11-09 ENCOUNTER — Ambulatory Visit (HOSPITAL_COMMUNITY): Payer: Self-pay | Admitting: Psychiatry

## 2014-02-06 ENCOUNTER — Encounter (HOSPITAL_COMMUNITY): Payer: Self-pay | Admitting: Psychiatry

## 2014-02-06 ENCOUNTER — Ambulatory Visit (INDEPENDENT_AMBULATORY_CARE_PROVIDER_SITE_OTHER): Payer: No Typology Code available for payment source | Admitting: Psychiatry

## 2014-02-06 VITALS — BP 127/80 | HR 78 | Ht 63.0 in | Wt 139.0 lb

## 2014-02-06 DIAGNOSIS — F411 Generalized anxiety disorder: Secondary | ICD-10-CM

## 2014-02-06 MED ORDER — DIAZEPAM 10 MG PO TABS: ORAL_TABLET | ORAL | Status: DC

## 2014-02-06 MED ORDER — SERTRALINE HCL 100 MG PO TABS: ORAL_TABLET | ORAL | Status: DC

## 2014-02-06 NOTE — Progress Notes (Signed)
[facility] [id number] Progress Note  RUNETTE SCIFRES [id number] 68 y.o.  02/06/2014 10:54 AM  Chief Complaint:  Medication management and followup.            History of Present Illness: [name] came for her followup appointment with her husband.  She is taking her medication as prescribed.  She is concerned about weight gain .  She is taking Lyrica and since then she has increase weight gain.  She is scheduled to have cataract surgery in a few weeks.  Overall her mood is stable.  She is very happy because her daughter bought a new place close to her house .  She denies any major panic attack.  She is sleeping good.  She denies any irritability, anger, crying spells.  She is seeing pain management for her chronic back pain.  She is taking Valium and Zoloft and denies any side effects.  Her primary care physician is Dr. Dannette Barbara who is managing her hypothyroidism .  Her vitals are stable.  However she gained weight from the past.  Suicidal Ideation: No Plan Formed: No Patient has means to carry out plan: No  Homicidal Ideation: No Plan Formed: No Patient has means to carry out plan: No  Review of Systems  Musculoskeletal: Positive for back pain.       Back spasms  Neurological: Negative.    Psychiatric: Agitation: No Hallucination: No Depressed Mood: No Insomnia: No Hypersomnia: No Altered Concentration: No Feels Worthless: No Grandiose Ideas: No Belief In Special Powers: No New/Increased Substance Abuse: No Compulsions: No  Neurologic: Headache: No Seizure: No Paresthesias: No  Past Medical Family, Social History:  Patient has history of hyperlipidemia, sinusitis, hypothyroidism, seasonal allergies and chronic pain.  She is seeing pain management for her chronic back pain.  She's unemployed.  She lives with her husband is very involved in her treatment.  Patient has a daughter and she is very attached with her daughter.  Her primary care physician is Dr.  Margo Murillo  Outpatient Encounter Prescriptions as of 02/06/2014  Medication Sig  . butalbital-acetaminophen-caffeine (FIORICET, ESGIC) 50-325-40 MG per tablet Take 2 tablets by mouth 2 (two) times daily as needed for headache or migraine.  . cyclobenzaprine (FLEXERIL) 10 MG tablet Take 10 mg by mouth 3 (three) times daily as needed.  . diazepam (VALIUM) 10 MG tablet Take 1/2 tablet (5 mg) three times a day and 1 tablet (10 mg) at bedtime.  . fenofibrate 160 MG tablet Take 160 mg by mouth daily.   [name] levothyroxine (SYNTHROID, LEVOTHROID) 25 MCG tablet   . LYRICA 25 MG capsule   . Oxycodone HCl 10 MG TABS   . sertraline (ZOLOFT) 100 MG tablet Take 2 tab daily  . simvastatin (ZOCOR) 20 MG tablet Take 40 mg by mouth at bedtime.   . [DISCONTINUED] diazepam (VALIUM) 10 MG tablet Take 1/2 tablet (5 mg) three times a day and 1 tablet (10 mg) at bedtime.  . [DISCONTINUED] sertraline (ZOLOFT) 100 MG tablet Take 2 tab daily    Past Psychiatric History/Hospitalization(s): Patient has a long history of anxiety disorder.  She was getting her psychiatric medication from her primary care physician until stopped working.  She has taken Effexor in the past. Bipolar Disorder: No Depression: Yes Mania: No Psychosis: No Schizophrenia: No Personality Disorder: No Hospitalization for psychiatric illness: No History of Electroconvulsive Shock Therapy: No Prior Suicide Attempts: No  Physical Exam: Constitutional:  BP 127/80  Pulse 78  Ht  (1.6  m)  Wt 139 lb (63.05 kg)  BMI 24.63 kg/m2  General Appearance: alert, oriented, no acute distress and well nourished  Musculoskeletal: Strength & Muscle Tone: Complain of back pain Gait & Station: normal Patient leans: N/A  Psychiatric: Speech (describe rate, volume, coherence, spontaneity, and abnormalities if any): Clear and coherent with normal tone and volume.  Thought Process (describe rate, content, abstract reasoning, and computation): Logical and  goal-directed.  Associations: Coherent, Relevant and Intact  Thoughts: normal  Mental Status: Orientation: oriented to person, place, time/date and situation Mood & Affect: anxiety and Appropriate affect Attention Span & Concentration: Okay  Established Problem, Stable/Improving (1), Review of Psycho-Social Stressors (1), Review of Last Therapy Session (1) and Review of Medication Regimen & Side Effects (2)  Assessment: Axis I: Anxiety disorder NOS  Axis II: Deferred  Axis III:  Patient Active Problem List   Diagnosis Date Noted  . Back pain 05/05/2012  . Fibromyalgia 05/05/2012  . Chest pressure 04/03/2012  . Carotid bruit 04/03/2012  . Anxiety 10/05/2011    Axis IV: Mild   Axis V: 60-65   Plan: Patient is a stable on her current psychotropic medication.  I will continue Valium 10 mg half tablet 3 times a day and one at bedtime and Zoloft 200 mg daily.  I recommended to talk to her pain specialist Dr Annitta Needs about Lyrica causing weight gain .  Discussed risks and benefits of medication.  Recommended to call us back if she has any question or any concern.  Followup in 3 months.  Kimiah Hibner T., MD 02/06/2014

## 2014-02-14 ENCOUNTER — Other Ambulatory Visit (HOSPITAL_COMMUNITY): Payer: Self-pay | Admitting: Internal Medicine

## 2014-02-14 DIAGNOSIS — M79662 Pain in left lower leg: Secondary | ICD-10-CM

## 2014-02-16 ENCOUNTER — Encounter (HOSPITAL_COMMUNITY): Payer: Self-pay | Admitting: Emergency Medicine

## 2014-02-16 ENCOUNTER — Emergency Department (HOSPITAL_COMMUNITY)
Admission: EM | Admit: 2014-02-16 | Discharge: 2014-02-16 | Disposition: A | Discharge status: Home / Self Care | Payer: Medicare Other | Attending: Emergency Medicine | Admitting: Emergency Medicine

## 2014-02-16 DIAGNOSIS — F411 Generalized anxiety disorder: Secondary | ICD-10-CM | POA: Insufficient documentation

## 2014-02-16 DIAGNOSIS — Z79899 Other long term (current) drug therapy: Secondary | ICD-10-CM | POA: Diagnosis not present

## 2014-02-16 DIAGNOSIS — R799 Abnormal finding of blood chemistry, unspecified: Secondary | ICD-10-CM | POA: Diagnosis not present

## 2014-02-16 DIAGNOSIS — Z8639 Personal history of other endocrine, nutritional and metabolic disease: Secondary | ICD-10-CM | POA: Diagnosis not present

## 2014-02-16 DIAGNOSIS — F17200 Nicotine dependence, unspecified, uncomplicated: Secondary | ICD-10-CM | POA: Diagnosis not present

## 2014-02-16 DIAGNOSIS — R7989 Other specified abnormal findings of blood chemistry: Secondary | ICD-10-CM

## 2014-02-16 DIAGNOSIS — Z8673 Personal history of transient ischemic attack (TIA), and cerebral infarction without residual deficits: Secondary | ICD-10-CM | POA: Insufficient documentation

## 2014-02-16 DIAGNOSIS — IMO0002 Reserved for concepts with insufficient information to code with codable children: Secondary | ICD-10-CM | POA: Insufficient documentation

## 2014-02-16 DIAGNOSIS — M79609 Pain in unspecified limb: Secondary | ICD-10-CM | POA: Diagnosis present

## 2014-02-16 DIAGNOSIS — Z862 Personal history of diseases of the blood and blood-forming organs and certain disorders involving the immune mechanism: Secondary | ICD-10-CM | POA: Diagnosis not present

## 2014-02-16 DIAGNOSIS — IMO0001 Reserved for inherently not codable concepts without codable children: Secondary | ICD-10-CM | POA: Diagnosis not present

## 2014-02-16 DIAGNOSIS — M79605 Pain in left leg: Secondary | ICD-10-CM

## 2014-02-16 LAB — CBC
HCT: 34.3 % — ABNORMAL LOW (ref 36.0–46.0)
HEMOGLOBIN: 11.7 g/dL — AB (ref 12.0–15.0)
MCH: 31.4 pg (ref 26.0–34.0)
MCHC: 34.1 g/dL (ref 30.0–36.0)
MCV: 92 fL (ref 78.0–100.0)
Platelets: 232 10*3/uL (ref 150–400)
RBC: 3.73 MIL/uL — ABNORMAL LOW (ref 3.87–5.11)
RDW: 13.2 % (ref 11.5–15.5)
WBC: 6.6 10*3/uL (ref 4.0–10.5)

## 2014-02-16 LAB — COMPREHENSIVE METABOLIC PANEL
ALBUMIN: 3.6 g/dL (ref 3.5–5.2)
ALK PHOS: 46 U/L (ref 39–117)
ALT: 13 U/L (ref 0–35)
AST: 21 U/L (ref 0–37)
Anion gap: 12 (ref 5–15)
BILIRUBIN TOTAL: 0.2 mg/dL — AB (ref 0.3–1.2)
BUN: 15 mg/dL (ref 6–23)
CO2: 23 mEq/L (ref 19–32)
Calcium: 9.2 mg/dL (ref 8.4–10.5)
Chloride: 106 mEq/L (ref 96–112)
Creatinine, Ser: 0.87 mg/dL (ref 0.50–1.10)
GFR calc Af Amer: 78 mL/min — ABNORMAL LOW (ref >90)
GFR calc non Af Amer: 67 mL/min — ABNORMAL LOW (ref >90)
Glucose, Bld: 171 mg/dL — ABNORMAL HIGH (ref 70–99)
POTASSIUM: 3.8 meq/L (ref 3.7–5.3)
Sodium: 141 mEq/L (ref 137–147)
TOTAL PROTEIN: 7.1 g/dL (ref 6.0–8.3)

## 2014-02-16 LAB — D-DIMER, QUANTITATIVE: D-Dimer, Quant: 2.09 ug/mL-FEU — ABNORMAL HIGH (ref 0.00–0.48)

## 2014-02-16 MED ORDER — HYDROMORPHONE HCL PF 1 MG/ML IJ SOLN
1.0000 mg | Freq: Once | INTRAMUSCULAR | Status: AC
  Administered 2014-02-16: 1 mg via INTRAMUSCULAR
  Filled 2014-02-16: qty 1

## 2014-02-16 NOTE — ED Provider Notes (Signed)
CSN: [id number]     Arrival date & time 02/16/14  1257 History   First MD Initiated Contact with Patient 02/16/14 1313     Chief Complaint  Patient presents with  . Leg Pain     (Consider location/radiation/quality/duration/timing/severity/associated sxs/prior Treatment) HPI Patient presents with concerns of ongoing leg pain. The pain is left posterior, from the popliteal fossa to the distal calf.  Pain has been present for at least one week. Pain is sore, severe, worse with ambulation. There is no superficial changes, nor size change of the leg. No distal dysesthesia or weakness that is new. Patient has a history of neuropathy. Patient saw her pain management specialist epigastric pain, was scheduled for outpatient ultrasound. She notes that her home narcotics, muscle relaxants do not decrease the pain substantially. She denies chest pain, dyspnea other changes from baseline medical condition. Patient does not smoke, has no recent travel history. Past Medical History  Diagnosis Date  . Hyperlipemia   . Sinusitis   . Back pain   . Fibromyalgia   . Stroke   . Anxiety    Past Surgical History  Procedure Laterality Date  . Cholecystectomy    . Abdominal hysterectomy     Family History  Problem Relation Age of Onset  . Depression Mother   . Depression Father   . Depression Sister   . Anxiety disorder Sister   . Alcohol abuse Neg Hx   . Drug abuse Neg Hx   . Anxiety disorder Sister    History  Substance Use Topics  . Smoking status: Current Every Day Smoker -- 1.00 packs/day for 30 years    Types: Cigarettes  . Smokeless tobacco: Not on file  . Alcohol Use: No   OB History   Grav Para Term Preterm Abortions TAB SAB Ect Mult Living                 Review of Systems  Constitutional:       Per HPI, otherwise negative  HENT:       Per HPI, otherwise negative  Eyes:       Chronic photosensitivity, unchanged, patient is following up with her physician this week   Respiratory:       Per HPI, otherwise negative  Cardiovascular:       Per HPI, otherwise negative  Gastrointestinal: Negative for vomiting.  Endocrine:       Negative aside from HPI  Genitourinary:       Neg aside from HPI   Musculoskeletal:       Per HPI, otherwise negative  Skin: Negative.   Neurological: Negative for syncope.      Allergies  Review of patient's allergies indicates no known allergies.  Home Medications   Prior to Admission medications   Medication Sig Start Date End Date Taking? Authorizing Provider  butalbital-acetaminophen-caffeine (FIORICET, ESGIC) 50-325-40 MG per tablet Take 2 tablets by mouth 2 (two) times daily as needed for headache or migraine.    Historical Provider, MD  cyclobenzaprine (FLEXERIL) 10 MG tablet Take 10 mg by mouth 3 (three) times daily as needed. 12/19/12   Historical Provider, MD  diazepam (VALIUM) 10 MG tablet Take 1/2 tablet (5 mg) three times a day and 1 tablet (10 mg) at bedtime. 02/06/14   Cleotis Nipper, MD  fenofibrate 160 MG tablet Take 160 mg by mouth daily.  04/05/11   Historical Provider, MD  Oxycodone HCl 10 MG TABS  02/26/13   Historical Provider, MD  sertraline (ZOLOFT) 100 MG tablet Take 2 tab daily 02/06/14   Cleotis Nipper, MD   BP 122/58  Pulse 92  Temp(Src) 98.7 F (37.1 C) (Oral)  Resp 18  Ht  (1.6 m)  Wt 135 lb (61.236 kg)  BMI 23.92 kg/m2  SpO2 98% Physical Exam  Nursing note and vitals reviewed. Constitutional: She is oriented to person, place, and time. She appears well-developed and well-nourished. No distress.  HENT:  Head: Normocephalic and atraumatic.  Eyes: Conjunctivae and EOM are normal.  Cardiovascular: Normal rate and regular rhythm.   Pulmonary/Chest: Effort normal and breath sounds normal. No stridor. No respiratory distress.  Abdominal: She exhibits no distension.  Musculoskeletal: She exhibits no edema.  Minimal tenderness to palpation about the popliteal fossa, left proximal  gastrocnemius muscle.  No superficial changes, no size asymmetry, no warmth to palpation. Patient's knee is otherwise unremarkable, ankle is unremarkable, contralateral lower extremity is unremarkable.   Neurological: She is alert and oriented to person, place, and time. No cranial nerve deficit.  Skin: Skin is warm and dry.  Psychiatric: She has a normal mood and affect.    ED Course  Procedures (including critical care time) Labs Review Labs Reviewed  COMPREHENSIVE METABOLIC PANEL - Abnormal; Notable for the following:    Glucose, Bld 171 (*)    Total Bilirubin 0.2 (*)    GFR calc non Af Amer 67 (*)    GFR calc Af Amer 78 (*)    All other components within normal limits  CBC - Abnormal; Notable for the following:    RBC 3.73 (*)    Hemoglobin 11.7 (*)    HCT 34.3 (*)    All other components within normal limits  D-DIMER, QUANTITATIVE - Abnormal; Notable for the following:    D-Dimer, Quant 2.09 (*)    All other components within normal limits    2:42 PM On repeat exam the patient is comfortable appearing, states that her pain has reduced substantially. Given the patient's elevated d-dimer, we will schedule ultrasound for tomorrow.  Patient's tremor may reflect her fibromyalgia, and absent other risk factors for DVT, unstable vital signs, she will not receive empiric anticoagulation pending tomorrow's study.  MDM  Patient presents with ongoing left calf, knee pain.  Patient's physical exam is reassuring, her vital signs are stable, she is in no distress, with no evidence for PE, and little suspicion for DVT.  However, the patient's d-dimer was elevated.  She was scheduled for next day ultrasound, and given substantial improvement in her pain, was appropriate for discharge. Patient already has pain management contract, did not receive additional prescriptions for narcotics.     Gerhard Munch, MD 02/16/14 1444

## 2014-02-16 NOTE — ED Notes (Signed)
Left leg pain times times 5 days.  Denies any injury.  Has ultrasound scheduled for this Thursday.

## 2014-02-16 NOTE — Discharge Instructions (Signed)
As discussed, it is very important that she return tomorrow for ultrasound of your leg.  If you develop new, or concerning changes in your condition in the antrum, please be sure to return here promptly.

## 2014-02-17 ENCOUNTER — Ambulatory Visit (HOSPITAL_COMMUNITY)
Admit: 2014-02-17 | Discharge: 2014-02-17 | Disposition: A | Discharge status: Home / Self Care | Payer: Medicare Other | Source: Ambulatory Visit | Attending: Emergency Medicine | Admitting: Emergency Medicine

## 2014-02-17 DIAGNOSIS — M79609 Pain in unspecified limb: Secondary | ICD-10-CM | POA: Diagnosis not present

## 2014-02-17 NOTE — ED Provider Notes (Signed)
  10:36 AM  02/18/15:  Patient returned today for outpatient venous ultrasound of the left lower extremity. Patient was notified by me that results were negative for a DVT. Patient verbalized understanding of results and agrees to followup with her primary physician, Dr. Dwana Melena.   Donalee Gaumond L. Shaliah Wann, PA-C 02/17/14 1038

## 2014-02-17 NOTE — ED Provider Notes (Signed)
Please see my initial encounter notes. This patient had next day [place] scheduled due to increased LE pain and positive dimer.   Gerhard Munch, MD 02/17/14 [phone]

## 2014-02-21 ENCOUNTER — Ambulatory Visit (HOSPITAL_COMMUNITY): Admission: RE | Admit: 2014-02-21 | Payer: Medicare Other | Source: Ambulatory Visit

## 2014-04-26 ENCOUNTER — Telehealth (HOSPITAL_COMMUNITY): Payer: Self-pay

## 2014-04-26 ENCOUNTER — Telehealth (HOSPITAL_COMMUNITY): Payer: Self-pay | Admitting: *Deleted

## 2014-04-26 NOTE — Telephone Encounter (Signed)
Pt's husband called very concerned for his wife. States she is having "nervous spells." Laying on the couch and shaking. This has been going on for 2 weeks now. [name] came into office while on the phone with husband and explained to husband to give her an extra 1/2 of Valium to see if this will help with anxiety. Husband states he will call back after 1 hour if no relief from anxiety.

## 2014-05-07 ENCOUNTER — Encounter (HOSPITAL_COMMUNITY): Payer: Self-pay | Admitting: Psychiatry

## 2014-05-07 ENCOUNTER — Ambulatory Visit (INDEPENDENT_AMBULATORY_CARE_PROVIDER_SITE_OTHER): Payer: No Typology Code available for payment source | Admitting: Psychiatry

## 2014-05-07 VITALS — BP 129/71 | HR 89 | Ht 63.0 in | Wt 134.4 lb

## 2014-05-07 DIAGNOSIS — F411 Generalized anxiety disorder: Secondary | ICD-10-CM

## 2014-05-07 MED ORDER — DIAZEPAM 10 MG PO TABS: ORAL_TABLET | ORAL | Status: DC

## 2014-05-07 MED ORDER — DULOXETINE HCL 30 MG PO CPEP: ORAL_CAPSULE | ORAL | Status: DC

## 2014-05-07 MED ORDER — SERTRALINE HCL 100 MG PO TABS: 100.0000 mg | ORAL_TABLET | Freq: Two times a day (BID) | ORAL | Status: DC

## 2014-05-07 NOTE — Progress Notes (Signed)
[facility] Progress Note  [name] [id number] 68 y.o.  05/07/2014 11:38 AM  Chief Complaint:  I have been more anxious and nervous.              History of Present Illness: [name] came for her followup appointment with her husband.  She had called a week ago because of increased anxiety and having shakes.  She is not sure what causes these shakes and nervousness but told it was a terrible experience.  She was recommended to take extra Valium and that helps.  However she has noticed that her anxiety and nervousness is going on for more than 2-3 weeks.  She is complaining of back pain and she is not happy with her pain specialist.  Despite taking pain medication she has difficulty walking.  She does not leave her house and feels burden to her family member.  Patient denies any other stressors.  Her daughter is doing very well and she is happy about her.  Patient admitted some time poor sleep, racing thoughts, and feeling overwhelmed.  She described these spells as a severe panic attack and does not feel that Zoloft is helping these panic attacks.  She is taking Valium 5 mg 3 times a day and 10 mg in the morning.  She denies any paranoia, hallucination, aggression or violence.  She lives with her husband who is very supportive.  Her primary care physician is Dr. Delene Ruffini call.  She was diagnosed with hypothyroidism and she was given medication but she did not like it.  Recently she visited emergency room because of knee pain and DVT was ruled out.  Patient is concerned about her physical health .  She denies drinking or using any illegal substances.  Her appetite is okay.  Her vitals are stable.    Suicidal Ideation: No Plan Formed: No Patient has means to carry out plan: No  Homicidal Ideation: No Plan Formed: No Patient has means to carry out plan: No  Review of Systems  Musculoskeletal: Positive for back pain.       Back spasms  Neurological: Negative.    Psychiatric/Behavioral: The patient is nervous/anxious and has insomnia.    Psychiatric: Agitation: No Hallucination: No Depressed Mood: Yes Insomnia: Yes Hypersomnia: No Altered Concentration: No Feels Worthless: No Grandiose Ideas: No Belief In Special Powers: No New/Increased Substance Abuse: No Compulsions: No  Neurologic: Headache: No Seizure: No Paresthesias: No   Outpatient Encounter Prescriptions as of 05/07/2014  Medication Sig  . butalbital-acetaminophen-caffeine (FIORICET, ESGIC) 50-325-40 MG per tablet Take 2 tablets by mouth 2 (two) times daily as needed for headache or migraine.  . cyclobenzaprine (FLEXERIL) 10 MG tablet Take 10 mg by mouth 3 (three) times daily as needed for muscle spasms.   . diazepam (VALIUM) 10 MG tablet Patient takes 1 tablet(50m) in the morning and 1/2 tablet(535m three times daily  . DULoxetine (CYMBALTA) 30 MG capsule Take 1 capsule daily for 1 week and than 2 daily  . fenofibrate 160 MG tablet Take 160 mg by mouth daily.   . Marland Kitchenentamicin (GARAMYCIN) 0.3 % ophthalmic solution Place 1 drop into the left eye 2 (two) times daily.  . Marland Kitchenetorolac (ACULAR) 0.5 % ophthalmic solution Place 1 drop into the left eye 2 (two) times daily.  . Oxycodone HCl 10 MG TABS Take 10 mg by mouth every 4 (four) hours as needed (pain).   . prednisoLONE acetate (PRED FORTE) 1 % ophthalmic suspension Place 1 drop into the left  eye 2 (two) times daily.  . sertraline (ZOLOFT) 100 MG tablet Take 1 tablet (100 mg total) by mouth 2 (two) times daily.  . simvastatin (ZOCOR) 40 MG tablet   . [DISCONTINUED] diazepam (VALIUM) 10 MG tablet Take 5-10 mg by mouth 4 (four) times daily. Patient takes 1 tablet(68m) in the morning and 1/2 tablet(513m three times daily  . [DISCONTINUED] sertraline (ZOLOFT) 100 MG tablet Take 100 mg by mouth 2 (two) times daily.   Recent Results (from the past 2160 hour(s))  Comprehensive metabolic panel     Status: Abnormal   Collection Time:  02/16/14  1:39 PM  Result Value Ref Range   Sodium 141 137 - 147 mEq/L   Potassium 3.8 3.7 - 5.3 mEq/L   Chloride 106 96 - 112 mEq/L   CO2 23 19 - 32 mEq/L   Glucose, Bld 171 (H) 70 - 99 mg/dL   BUN 15 6 - 23 mg/dL   Creatinine, Ser 0.87 0.50 - 1.10 mg/dL   Calcium 9.2 8.4 - 10.5 mg/dL   Total Protein 7.1 6.0 - 8.3 g/dL   Albumin 3.6 3.5 - 5.2 g/dL   AST 21 0 - 37 U/L   ALT 13 0 - 35 U/L   Alkaline Phosphatase 46 39 - 117 U/L   Total Bilirubin 0.2 (L) 0.3 - 1.2 mg/dL   GFR calc non Af Amer 67 (L) >90 mL/min   GFR calc Af Amer 78 (L) >90 mL/min    Comment: (NOTE) The eGFR has been calculated using the CKD EPI equation. This calculation has not been validated in all clinical situations. eGFR's persistently <90 mL/min signify possible Chronic Kidney Disease.   Anion gap 12 5 - 15  CBC     Status: Abnormal   Collection Time: 02/16/14  1:39 PM  Result Value Ref Range   WBC 6.6 4.0 - 10.5 K/uL   RBC 3.73 (L) 3.87 - 5.11 MIL/uL   Hemoglobin 11.7 (L) 12.0 - 15.0 g/dL   HCT 34.3 (L) 36.0 - 46.0 %   MCV 92.0 78.0 - 100.0 fL   MCH 31.4 26.0 - 34.0 pg   MCHC 34.1 30.0 - 36.0 g/dL   RDW 13.2 11.5 - 15.5 %   Platelets 232 150 - 400 K/uL  D-dimer, quantitative     Status: Abnormal   Collection Time: 02/16/14  1:39 PM  Result Value Ref Range   D-Dimer, Quant 2.09 (H) 0.00 - 0.48 ug/mL-FEU    Comment:        AT THE INHOUSE ESTABLISHED CUTOFF VALUE OF 0.48 ug/mL FEU, THIS ASSAY HAS BEEN DOCUMENTED IN THE LITERATURE TO HAVE A SENSITIVITY AND NEGATIVE PREDICTIVE VALUE OF AT LEAST 98 TO 99%.  THE TEST RESULT SHOULD BE CORRELATED WITH AN ASSESSMENT OF THE CLINICAL PROBABILITY OF DVT / VTE.   Past Psychiatric History/Hospitalization(s): Patient has a long history of anxiety disorder.  She was getting her psychiatric medication from her primary care physician until stopped working.  She has taken Effexor in the past. Bipolar Disorder: No Depression: Yes Mania: No Psychosis:  No Schizophrenia: No Personality Disorder: No Hospitalization for psychiatric illness: No History of Electroconvulsive Shock Therapy: No Prior Suicide Attempts: No   Past Medical Family, Social History:  Patient has history of hyperlipidemia, sinusitis, hypothyroidism, seasonal allergies and chronic pain.  She is seeing pain management for her chronic back pain.  She's unemployed.  She lives with her husband who is very involved in her treatment.  Patient has a  daughter and she is very attached with her daughter.  Her primary care physician is Dr. Nevada Crane   Physical Exam: Constitutional:  BP 129/71 mmHg  Pulse 89  Ht _0  (1.6 m)  Wt 134 lb 6.4 oz (60.963 kg)  BMI 23.81 kg/m2  General Appearance: alert, oriented, no acute distress and well nourished  Musculoskeletal: Strength & Muscle Tone: Complain of back pain Gait & Station: normal Patient leans: N/A  Psychiatric: Speech (describe rate, volume, coherence, spontaneity, and abnormalities if any): Clear and coherent with normal tone and volume.  Thought Process (describe rate, content, abstract reasoning, and computation): Logical and goal-directed.  Associations: Coherent, Relevant and Intact  Thoughts: normal  Mental Status: Orientation: oriented to person, place, time/date and situation Mood & Affect: anxiety and Appropriate affect Attention Span & Concentration: Okay  Established Problem, Stable/Improving (1), Review of Psycho-Social Stressors (1), Review and summation of old records (2), Established Problem, Worsening (2), Review of Last Therapy Session (1), Review of Medication Regimen & Side Effects (2) and Review of New Medication or Change in Dosage (2)  Assessment: Axis I: Generalized anxiety disorder.    Axis II: Deferred  Axis III:  Patient Active Problem List   Diagnosis Date Noted  . Back pain 05/05/2012  . Fibromyalgia 05/05/2012  . Chest pressure 04/03/2012  . Carotid bruit 04/03/2012  . Anxiety  10/05/2011    Plan: I review her current medication, psychosocial stressor, recent blood work and emergency room visits.  Discussed medication side effects.  She is taking a moderate dose of Valium.  She continues to have anxiety and nervousness.  Despite taking pain medication she does not feel her pain is under control.  She never tried Cymbalta in the past.  I recommended to try Cymbalta which can help her anxiety and possible pain .  Recommended to take Cymbalta 30 mg for 1 week and then 60 mg and she will cut down her Zoloft from 200 mg to 100 mg.  Discuss cross taper off her medication.  We will consider increasing Cymbalta and taking her off from Zoloft if Cymbalta works.  For now she will continue Valium 10 mg in the morning and 5 mg 3 times a day.  Discussed benzodiazepine dependence, tolerance and withdrawal.  Offer counseling but patient declined.  We will also get collateral information from her primary care physician and her pain specialist Dr Manya Silvas.  Discussed medication side effects and recommended to call us back if she has any question, concern or if she feeling worsening of the symptom.  I will see her again in 3 weeks.  Time spent 25 minutes.  More than 50% of the time spent in psychoeducation, counseling and coordination of care.  Discuss safety plan that anytime having active suicidal thoughts or homicidal thoughts then patient need to call 911 or go to the local emergency room.  Anique Beckley T., MD 05/07/2014

## 2014-05-13 ENCOUNTER — Ambulatory Visit (HOSPITAL_COMMUNITY): Payer: Self-pay | Admitting: Psychiatry

## 2014-05-28 ENCOUNTER — Ambulatory Visit (HOSPITAL_COMMUNITY): Payer: Self-pay | Admitting: Psychiatry

## 2014-05-30 ENCOUNTER — Encounter (HOSPITAL_COMMUNITY): Payer: Self-pay | Admitting: Psychiatry

## 2014-05-30 ENCOUNTER — Ambulatory Visit (INDEPENDENT_AMBULATORY_CARE_PROVIDER_SITE_OTHER): Payer: No Typology Code available for payment source | Admitting: Psychiatry

## 2014-05-30 VITALS — BP 145/88 | HR 88 | Ht 63.0 in | Wt 135.0 lb

## 2014-05-30 DIAGNOSIS — F411 Generalized anxiety disorder: Secondary | ICD-10-CM

## 2014-05-30 MED ORDER — DULOXETINE HCL 30 MG PO CPEP: ORAL_CAPSULE | ORAL | Status: DC

## 2014-05-30 MED ORDER — CLONAZEPAM 1 MG PO TABS: ORAL_TABLET | ORAL | Status: DC

## 2014-05-30 NOTE — Progress Notes (Signed)
[facility] [id number] Progress Note  [name] [id number] 68 y.o.  05/30/2014 12:06 PM  Chief Complaint:    I don't think Valium is working.              History of Present Illness: [name] came for her followup appointment with her husband.   She was recently seen and her antidepressants were changed.  She felt that Zoloft was not working and be started her on Cymbalta.  She feels less depressed however she continues to have anxiety and panic attack.  She is having panic attack 3-4 times a day.  She is tolerating Cymbalta and denies any side effects.  She endorse racing thoughts, lack of energy and motivation.  She does not leave her house unless it is important.  She is also complaining a lot of chronic pain.  She is going pain specialist but does not feel her pain medicine is working very well.  She endorse feeling overwhelmed and she is afraid that she may get worse.  He denies any hallucination, paranoia, suicidal thoughts or homicidal thought.  Her husband is very supportive.  She denies drinking or using any illegal substances.  She scheduled to have Christmas with her daughter. Her appetite is okay.  Her vitals are stable.   She also brought blood work results which shows hemoglobin A1c 6.1 and triglycerides 173.  Her CBC is normal her basic chemistry is also normal.  Suicidal Ideation: No Plan Formed: No Patient has means to carry out plan: No  Homicidal Ideation: No Plan Formed: No Patient has means to carry out plan: No  Review of Systems  Musculoskeletal: Positive for back pain.       Back spasms  Neurological: Negative.   Psychiatric/Behavioral: The patient is nervous/anxious and has insomnia.    Psychiatric: Agitation: No Hallucination: No Depressed Mood: Yes Insomnia: Yes Hypersomnia: No Altered Concentration: No Feels Worthless: No Grandiose Ideas: No Belief In Special Powers: No New/Increased Substance Abuse: No Compulsions:  No  Neurologic: Headache: No Seizure: No Paresthesias: No   Outpatient Encounter Prescriptions as of 05/30/2014  Medication Sig  . butalbital-acetaminophen-caffeine (FIORICET, ESGIC) 50-325-40 MG per tablet Take 2 tablets by mouth 2 (two) times daily as needed for headache or migraine.  . clonazePAM (KLONOPIN) 1 MG tablet Take 1/2 to 1 tab twice a day  . cyclobenzaprine (FLEXERIL) 10 MG tablet Take 10 mg by mouth 3 (three) times daily as needed for muscle spasms.   . DULoxetine (CYMBALTA) 30 MG capsule Take 3 capsule daily  . fenofibrate 160 MG tablet Take 160 mg by mouth daily.   Marland Kitchen. gentamicin (GARAMYCIN) 0.3 % ophthalmic solution Place 1 drop into the left eye 2 (two) times daily.  Marland Kitchen. ketorolac (ACULAR) 0.5 % ophthalmic solution Place 1 drop into the left eye 2 (two) times daily.  . Oxycodone HCl 10 MG TABS Take 10 mg by mouth every 4 (four) hours as needed (pain).   . prednisoLONE acetate (PRED FORTE) 1 % ophthalmic suspension Place 1 drop into the left eye 2 (two) times daily.  . simvastatin (ZOCOR) 40 MG tablet   . [DISCONTINUED] diazepam (VALIUM) 10 MG tablet Patient takes 1 tablet(10mg ) in the morning and 1/2 tablet(5mg ) three times daily  . [DISCONTINUED] DULoxetine (CYMBALTA) 30 MG capsule Take 1 capsule daily for 1 week and than 2 daily  . [DISCONTINUED] sertraline (ZOLOFT) 100 MG tablet Take 1 tablet (100 mg total) by mouth 2 (two) times daily.   No results found  for this or any previous visit (from the past 2160 hour(s)). Past Psychiatric History/Hospitalization(s): Patient has a long history of anxiety disorder.  She was getting her psychiatric medication from her primary care physician until stopped working.  She has taken Effexor in the past. Bipolar Disorder: No Depression: Yes Mania: No Psychosis: No Schizophrenia: No Personality Disorder: No Hospitalization for psychiatric illness: No History of Electroconvulsive Shock Therapy: No Prior Suicide Attempts: No   Past  Medical Family, Social History:  Patient has history of hyperlipidemia, sinusitis, hypothyroidism, seasonal allergies and chronic pain.  She is seeing pain management for her chronic back pain.  She's unemployed.  She lives with her husband who is very involved in her treatment.  Patient has a daughter and she is very attached with her daughter.  Her primary care physician is Dr. Margo Murillo   Physical Exam: Constitutional:  BP 145/88 mmHg  Pulse 88  Ht 5\' 3"  (1.6 m)  Wt 135 lb (61.236 kg)  BMI 23.92 kg/m2  General Appearance: alert, oriented, no acute distress and well nourished  Musculoskeletal: Strength & Muscle Tone: Complain of back pain Gait & Station: normal Patient leans: N/A  Psychiatric: Speech (describe rate, volume, coherence, spontaneity, and abnormalities if any): Clear and coherent with normal tone and volume.  Thought Process (describe rate, content, abstract reasoning, and computation): Logical and goal-directed.  Associations: Coherent, Relevant and Intact  Thoughts: normal  Mental Status: Orientation: oriented to person, place, time/date and situation Mood & Affect: anxiety and Appropriate affect Attention Span & Concentration: Okay  Established Problem, Stable/Improving (1), Review of Psycho-Social Stressors (1), Review and summation of old records (2), Established Problem, Worsening (2), Review of Last Therapy Session (1), Review of Medication Regimen & Side Effects (2) and Review of New Medication or Change in Dosage (2)  Assessment: Axis I: Generalized anxiety disorder.    Axis II: Deferred  Axis III:  Patient Active Problem List   Diagnosis Date Noted  . Back pain 05/05/2012  . Fibromyalgia 05/05/2012  . Chest pressure 04/03/2012  . Carotid bruit 04/03/2012  . Anxiety 10/05/2011    Plan: Reassurance given.  I will discontinue Valium and try Klonopin 1 mg half to one tablet twice a day to help anxiety symptoms.  She remember taking Klonopin in the  past with good response.  I also recommended to increase Cymbalta 90 mg however strongly encouraged to see her pain specialist for the management of chronic pain.  I review blood work results.  She is no longer taking Zoloft.  Discussed benzodiazepine dependence tolerance and withdrawal symptoms.  One more time I offered counseling but patient declined.  Patient is getting pain treatment from Dr. Jamie Brookesunkawa.  I will see her again in 4 weeks.  Time spent 25 minutes.  More than 50% of the time spent in psychoeducation, counseling and coordination of care.  Discuss safety plan that anytime having active suicidal thoughts or homicidal thoughts then patient need to call 911 or go to the local emergency room.  Havish Petties T., MD 05/30/2014

## 2014-06-21 ENCOUNTER — Encounter (HOSPITAL_COMMUNITY): Payer: Self-pay | Admitting: Psychiatry

## 2014-06-21 ENCOUNTER — Ambulatory Visit (INDEPENDENT_AMBULATORY_CARE_PROVIDER_SITE_OTHER): Payer: No Typology Code available for payment source | Admitting: Psychiatry

## 2014-06-21 VITALS — BP 144/76 | HR 83 | Ht 63.0 in | Wt 136.0 lb

## 2014-06-21 DIAGNOSIS — F411 Generalized anxiety disorder: Secondary | ICD-10-CM

## 2014-06-21 MED ORDER — CLONAZEPAM 1 MG PO TABS: ORAL_TABLET | ORAL | Status: DC

## 2014-06-21 MED ORDER — DULOXETINE HCL 30 MG PO CPEP: ORAL_CAPSULE | ORAL | Status: DC

## 2014-06-21 NOTE — Progress Notes (Signed)
[facility] [id number] Progress Note  NATURE KUEKER [id number] 69 y.o.  06/21/2014 9:21 AM  Chief Complaint:  I am feeling much better but sometime I take third Klonopin but I'm very anxious.  History of Present Illness: [name] came for her followup appointment with her husband.    she is taking Cymbalta 90 mg daily and be started her on Klonopin twice a day .  Her anxiety and nervousness is much improved.  There are times when she need a third Klonopin.  She had good energy level.  She sleeping good.  She continues to have back pain and she started seeing [facility] pain management for her chronic back pain.  She tried to go back on Dr.Dinkuwa but he has not taking any new patient. She denies any crying spells, irritability or any feeling of hopelessness or worthlessness.  She had a good Christmas with her daughter.  Her appetite is okay.  Her vitals are stable.  She is no longer taking Zoloft and Valium.    Suicidal Ideation: No Plan Formed: No Patient has means to carry out plan: No  Homicidal Ideation: No Plan Formed: No Patient has means to carry out plan: No  Review of Systems  Musculoskeletal:       Back spasms  Neurological: Negative.    Psychiatric: Agitation: No Hallucination: No Depressed Mood: No Insomnia: No Hypersomnia: No Altered Concentration: No Feels Worthless: No Grandiose Ideas: No Belief In Special Powers: No New/Increased Substance Abuse: No Compulsions: No  Neurologic: Headache: No Seizure: No Paresthesias: No   Outpatient Encounter Prescriptions as of 06/21/2014  Medication Sig  . butalbital-acetaminophen-caffeine (FIORICET, ESGIC) 50-325-40 MG per tablet Take 2 tablets by mouth 2 (two) times daily as needed for headache or migraine.  . clonazePAM (KLONOPIN) 1 MG tablet Take 1 tab twice a day and 3rd as needed for severe anxiety  . cyclobenzaprine (FLEXERIL) 10 MG tablet Take 10 mg by mouth 3 (three) times daily as needed for muscle spasms.    . DULoxetine (CYMBALTA) 30 MG capsule Take 3 capsule daily  . fenofibrate 160 MG tablet Take 160 mg by mouth daily.   Marland Kitchen gentamicin (GARAMYCIN) 0.3 % ophthalmic solution Place 1 drop into the left eye 2 (two) times daily.  Marland Kitchen ketorolac (ACULAR) 0.5 % ophthalmic solution Place 1 drop into the left eye 2 (two) times daily.  . Oxycodone HCl 10 MG TABS Take 10 mg by mouth every 4 (four) hours as needed (pain).   . prednisoLONE acetate (PRED FORTE) 1 % ophthalmic suspension Place 1 drop into the left eye 2 (two) times daily.  . simvastatin (ZOCOR) 40 MG tablet   . [DISCONTINUED] clonazePAM (KLONOPIN) 1 MG tablet Take 1/2 to 1 tab twice a day  . [DISCONTINUED] DULoxetine (CYMBALTA) 30 MG capsule Take 3 capsule daily   No results found for this or any previous visit (from the past 2160 hour(s)). Past Psychiatric History/Hospitalization(s): Patient has a long history of anxiety disorder.  She was getting her psychiatric medication from her primary care physician until stopped working.  She has taken Effexor in the past. Bipolar Disorder: No Depression: Yes Mania: No Psychosis: No Schizophrenia: No Personality Disorder: No Hospitalization for psychiatric illness: No History of Electroconvulsive Shock Therapy: No Prior Suicide Attempts: No   Past Medical Family, Social History:  Patient has history of hyperlipidemia, sinusitis, hypothyroidism, seasonal allergies and chronic pain.  She is seeing pain management for her chronic back pain.  She's unemployed.  She lives with  her husband who is very involved in her treatment.  Patient has a daughter and she is very attached with her daughter.  Her primary care physician is Dr. Margo AyeHall   Physical Exam: Constitutional:  BP 144/76 mmHg  Pulse 83  Ht 5\' 3"  (1.6 m)  Wt 136 lb (61.689 kg)  BMI 24.10 kg/m2  General Appearance: alert, oriented, no acute distress and well nourished  Musculoskeletal: Strength & Muscle Tone: Complain of back pain Gait &  Station: normal Patient leans: N/A  Psychiatric: Speech (describe rate, volume, coherence, spontaneity, and abnormalities if any): Clear and coherent with normal tone and volume.  Thought Process (describe rate, content, abstract reasoning, and computation): Logical and goal-directed.  Associations: Coherent, Relevant and Intact  Thoughts: normal  Mental Status: Orientation: oriented to person, place, time/date and situation Mood & Affect: normal affect Attention Span & Concentration: Okay  Established Problem, Stable/Improving (1), Review of Psycho-Social Stressors (1), Review of Last Therapy Session (1) and Review of Medication Regimen & Side Effects (2)  Assessment: Axis I: Generalized anxiety disorder.    Axis II: Deferred  Axis III:  Patient Active Problem List   Diagnosis Date Noted  . Back pain 05/05/2012  . Fibromyalgia 05/05/2012  . Chest pressure 04/03/2012  . Carotid bruit 04/03/2012  . Anxiety 10/05/2011    Plan:  patient is doing better on her current medication.  I will continue Cymbalta 90 mg daily.  She has no side effects including any tremors or shakes or any muscle stiffness.  Recommended to take Klonopin 1 mg twice a day and she can take third as needed for severe anxiety.  We will provide 10 extra tablet per month .  Discussed medication side effects specialty postural hypotension, dependency, tolerance and withdrawal symptoms.  Encouraged to keep appointment with pain management.  I will see her again in 3 months.    ARFEEN,SYED T., MD 06/21/2014

## 2014-07-05 ENCOUNTER — Ambulatory Visit (HOSPITAL_COMMUNITY): Payer: Self-pay | Admitting: Psychiatry

## 2014-07-30 ENCOUNTER — Telehealth (HOSPITAL_COMMUNITY): Payer: Self-pay

## 2014-07-30 NOTE — Telephone Encounter (Signed)
Telephone call with patient to follow up on a message left by patient's husband stating his wife was having "a very bad day".  [name] reported she had "one of those spells" earlier today that lasted from about 9am - 2:30pm.  Patient reported she is still getting panic attacks most days that seem to hit her in the mornings and eases off in the afternoons.  Patient reported she had already taken her extra 10 klonopin tablets this month as states taking 3 a day when she first got her prescription filled but also reported even with the third klonopin pill a day not feeling her current medication regimen effectively manages her anxiety and panic attacks.  Patient requested [name] look over her medications and try some change to help with increased intensity of recent daily panic attacks.  Patient denied any suicidal or homicidal ideations and agreed to also keep her primary care provider informed about periodic episodes of shortness of breath, considering patient stated she had a stroke by history.  Informed patient this nurse would send a note to [name] to request he follow up with her but would likely not be this evening due to the late hour.  Patient was okay with this and agreed to call back on 07/31/14 by noon if she had not heard back from this nurse or [name] by then.

## 2014-07-31 ENCOUNTER — Telehealth (HOSPITAL_COMMUNITY): Payer: Self-pay | Admitting: Psychiatry

## 2014-07-31 MED ORDER — GABAPENTIN 300 MG PO CAPS: 300.0000 mg | ORAL_CAPSULE | Freq: Two times a day (BID) | ORAL | Status: DC

## 2014-07-31 NOTE — Telephone Encounter (Signed)
I returned patient's phone call.  She is having panic attack and nervousness.  She is taking Klonopin up to 3 a day and it is not helping her panic attack.  I recommended to try Neurontin 300 mg twice a day to help her anxiety.  In the past she has taken lorazepam but limited response.  Discussed medication side effects and benefits.  Recommended to call us back if she feels worsening of the symptoms.  We will call prescription to her pharmacy.

## 2014-08-26 ENCOUNTER — Other Ambulatory Visit (HOSPITAL_COMMUNITY): Payer: Self-pay | Admitting: Psychiatry

## 2014-08-26 DIAGNOSIS — F411 Generalized anxiety disorder: Secondary | ICD-10-CM

## 2014-08-26 NOTE — Telephone Encounter (Signed)
Refill request of patient's Neurontin, started by [name] 07/31/14 authorized for one time refill by [name] today to last patient until evaluation set for 09/23/14.  New order e-scribed to [facility] [place]

## 2014-09-06 ENCOUNTER — Other Ambulatory Visit (HOSPITAL_COMMUNITY): Payer: Self-pay | Admitting: Psychiatry

## 2014-09-13 ENCOUNTER — Emergency Department (HOSPITAL_COMMUNITY)
Admission: EM | Admit: 2014-09-13 | Discharge: 2014-09-13 | Disposition: A | Discharge status: Home / Self Care | Payer: Medicare Other | Attending: Emergency Medicine | Admitting: Emergency Medicine

## 2014-09-13 ENCOUNTER — Emergency Department (HOSPITAL_COMMUNITY): Payer: Medicare Other

## 2014-09-13 DIAGNOSIS — M797 Fibromyalgia: Secondary | ICD-10-CM | POA: Insufficient documentation

## 2014-09-13 DIAGNOSIS — Z720 Tobacco use: Secondary | ICD-10-CM | POA: Insufficient documentation

## 2014-09-13 DIAGNOSIS — E785 Hyperlipidemia, unspecified: Secondary | ICD-10-CM | POA: Diagnosis not present

## 2014-09-13 DIAGNOSIS — R110 Nausea: Secondary | ICD-10-CM | POA: Insufficient documentation

## 2014-09-13 DIAGNOSIS — R42 Dizziness and giddiness: Secondary | ICD-10-CM | POA: Insufficient documentation

## 2014-09-13 DIAGNOSIS — Z79899 Other long term (current) drug therapy: Secondary | ICD-10-CM | POA: Diagnosis not present

## 2014-09-13 DIAGNOSIS — Z8709 Personal history of other diseases of the respiratory system: Secondary | ICD-10-CM | POA: Diagnosis not present

## 2014-09-13 DIAGNOSIS — Z8673 Personal history of transient ischemic attack (TIA), and cerebral infarction without residual deficits: Secondary | ICD-10-CM | POA: Diagnosis not present

## 2014-09-13 DIAGNOSIS — Z8659 Personal history of other mental and behavioral disorders: Secondary | ICD-10-CM | POA: Diagnosis not present

## 2014-09-13 LAB — CBG MONITORING, ED: GLUCOSE-CAPILLARY: 106 mg/dL — AB (ref 70–99)

## 2014-09-13 MED ORDER — ONDANSETRON 8 MG PO TBDP
8.0000 mg | ORAL_TABLET | Freq: Once | ORAL | Status: AC
  Administered 2014-09-13: 8 mg via ORAL
  Filled 2014-09-13: qty 1

## 2014-09-13 MED ORDER — ONDANSETRON HCL 8 MG PO TABS: 8.0000 mg | ORAL_TABLET | ORAL | Status: DC | PRN

## 2014-09-13 MED ORDER — MECLIZINE HCL 25 MG PO TABS: 25.0000 mg | ORAL_TABLET | Freq: Four times a day (QID) | ORAL | Status: DC

## 2014-09-13 MED ORDER — MECLIZINE HCL 12.5 MG PO TABS
25.0000 mg | ORAL_TABLET | Freq: Once | ORAL | Status: AC
  Administered 2014-09-13: 25 mg via ORAL
  Filled 2014-09-13: qty 2

## 2014-09-13 NOTE — Discharge Instructions (Signed)
CT scan shows no acute findings. Rx for dizziness. Your doctor may need to assess your entire medication list because some of these medications may be causing your symptoms

## 2014-09-13 NOTE — ED Provider Notes (Addendum)
CSN: [id number]     Arrival date & time 09/13/14  1139 History  This chart was scribed for Donnetta Hutching, MD by Elveria Rising, ED scribe.  This patient was seen in room APA04/APA04 and the patient's care was started at 12:14 PM.   Chief Complaint  Patient presents with  . Dizziness   The history is provided by the patient. No language interpreter was used.   HPI Comments: [name] is a 69 y.o. female with PMHx of Hyperlipidemia, fibromyalgia who presents to the Emergency Department complaining of intermittent dizziness and nausea ongoing for 1-2. Patient reports spontaneous onset. Patient reports that she felt fine this morning and when attempting to leave to get lunch her sister, she became dizzy. Patient reports double vision, denies room spinning sensation stating "I think it's in my head." Patient's PCP is Dr. [name]. Patient is client at a pain clinic and has a psychiatrist here in town. Patient's medications Cymbalta and Klonopin. Husband shares that after complaints of dizziness, her doctor started her on Neurontin. Patient states experiences the dizziness with and without her medication, so they are uncertain of the culprit.   Past Medical History  Diagnosis Date  . Hyperlipemia   . Sinusitis   . Back pain   . Fibromyalgia   . Stroke   . Anxiety    Past Surgical History  Procedure Laterality Date  . Cholecystectomy    . Abdominal hysterectomy     Family History  Problem Relation Age of Onset  . Depression Mother   . Depression Father   . Depression Sister   . Anxiety disorder Sister   . Alcohol abuse Neg Hx   . Drug abuse Neg Hx   . Anxiety disorder Sister    History  Substance Use Topics  . Smoking status: Current Every Day Smoker -- 1.00 packs/day for 30 years    Types: Cigarettes  . Smokeless tobacco: Not on file  . Alcohol Use: No   OB History    No data available     Review of Systems A complete 10 system review of systems was obtained and all systems are  negative except as noted in the HPI and PMH.    Allergies  Review of patient's allergies indicates no known allergies.  Home Medications   Prior to Admission medications   Medication Sig Start Date End Date Taking? Authorizing Provider  butalbital-acetaminophen-caffeine (FIORICET, ESGIC) 50-325-40 MG per tablet Take 2 tablets by mouth 2 (two) times daily as needed for headache or migraine.   Yes Historical Provider, MD  cholecalciferol (VITAMIN D) 1000 UNITS tablet Take 1,000 Units by mouth daily.   Yes Historical Provider, MD  clonazePAM (KLONOPIN) 1 MG tablet Take 1 tab twice a day and 3rd as needed for severe anxiety 06/21/14  Yes Cleotis Nipper, MD  cyclobenzaprine (FLEXERIL) 10 MG tablet Take 10 mg by mouth 3 (three) times daily as needed for muscle spasms.  12/19/12  Yes Historical Provider, MD  DULoxetine (CYMBALTA) 30 MG capsule Take 3 capsule daily Patient taking differently: Take 30 mg by mouth 3 (three) times daily. Take 3 capsule daily 06/21/14  Yes Cleotis Nipper, MD  gabapentin (NEURONTIN) 300 MG capsule TAKE 1 CAPSULE BY MOUTH TWICE DAILY. 08/26/14  Yes Cleotis Nipper, MD  Oxycodone HCl 10 MG TABS Take 10 mg by mouth every 4 (four) hours as needed (pain).  02/26/13  Yes Historical Provider, MD  simvastatin (ZOCOR) 40 MG tablet Take 40 mg by  mouth daily at 6 PM.  04/29/14  Yes Historical Provider, MD  meclizine (ANTIVERT) 25 MG tablet Take 1 tablet (25 mg total) by mouth 4 (four) times daily. 09/13/14   Donnetta HutchingBrian Peony Barner, MD   Triage Vitals: BP 123/57 mmHg  Pulse 100  Temp(Src) 98.3 F (36.8 C) (Oral)  Resp 20  Ht 5\' 3"  (1.6 m)  Wt 130 lb (58.968 kg)  BMI 23.03 kg/m2  SpO2 99% Physical Exam  Constitutional: She is oriented to person, place, and time. She appears well-developed and well-nourished.  HENT:  Head: Normocephalic and atraumatic.  Eyes: Conjunctivae and EOM are normal. Pupils are equal, round, and reactive to light.  Neck: Normal range of motion. Neck supple.  Cardiovascular:  Normal rate and regular rhythm.   Pulmonary/Chest: Effort normal and breath sounds normal.  Abdominal: Soft. Bowel sounds are normal.  Musculoskeletal: Normal range of motion.  Neurological: She is alert and oriented to person, place, and time.  Skin: Skin is warm and dry.  Psychiatric: She has a normal mood and affect. Her behavior is normal.  Nursing note and vitals reviewed.   ED Course  Procedures (including critical care time)  COORDINATION OF CARE: 12:14 PM- Plans to obtain basic blood work and to obtain head CT. Discussed treatment plan with patient at bedside and patient agreed to plan.   Labs Review Labs Reviewed  CBG MONITORING, ED - Abnormal; Notable for the following:    Glucose-Capillary 106 (*)    All other components within normal limits  CBG MONITORING, ED    Imaging Review Ct Head Wo Contrast  09/13/2014   CLINICAL DATA:  Dizziness for months, worse today.  EXAM: CT HEAD WITHOUT CONTRAST  TECHNIQUE: Contiguous axial images were obtained from the base of the skull through the vertex without intravenous contrast.  COMPARISON:  06/03/2010  FINDINGS: Skull and Sinuses:Negative for fracture or destructive process.  No mastoiditis to explain dizziness.  Polyp or mucous retention cyst in the lower right maxillary antrum, present since at least 2012.  Orbits: Bilateral cataract resection.  Brain: No evidence of acute infarction, hemorrhage, hydrocephalus, or mass lesion/mass effect.  IMPRESSION: Negative head CT.   Electronically Signed   By: Marnee SpringJonathon  Watts M.D.   On: 09/13/2014 14:01     EKG Interpretation None      MDM   Final diagnoses:  Vertigo    Patient's hemodynamic be stable. She is on a lot of medications which could be the cause of her symptoms. Glucose normal.  CT head negative. Discharge medications Antivert 25 mg and Zofran 8 mg  I personally performed the services described in this documentation, which was scribed in my presence. The recorded  information has been reviewed and is accurate.    Donnetta HutchingBrian Wanda Rideout, MD 09/13/14 1512  Donnetta HutchingBrian Zuria Fosdick, MD 09/13/14 [phone]

## 2014-09-13 NOTE — ED Notes (Addendum)
Pt presents to ED w/ c/o dizziness. Pt states that dizziness has been off and on for the last month.  Pt states that "she always has a h/a" and that she has fallen 2x in the last month.  Pt states that she has nausea but no emesis.  Pt states that moving head makes her feel dizzy. Pt reports that she is having double vision today.  Pt states that she has not taken anything for pain. Pt states she has left side h/a

## 2014-09-23 ENCOUNTER — Encounter (HOSPITAL_COMMUNITY): Payer: Self-pay | Admitting: Psychiatry

## 2014-09-23 ENCOUNTER — Ambulatory Visit (INDEPENDENT_AMBULATORY_CARE_PROVIDER_SITE_OTHER): Payer: No Typology Code available for payment source | Admitting: Psychiatry

## 2014-09-23 VITALS — BP 134/70 | HR 80 | Ht 63.0 in | Wt 137.8 lb

## 2014-09-23 DIAGNOSIS — F411 Generalized anxiety disorder: Secondary | ICD-10-CM | POA: Diagnosis not present

## 2014-09-23 MED ORDER — DULOXETINE HCL 30 MG PO CPEP: ORAL_CAPSULE | ORAL | Status: DC

## 2014-09-23 MED ORDER — CLONAZEPAM 1 MG PO TABS: ORAL_TABLET | ORAL | Status: DC

## 2014-09-23 MED ORDER — GABAPENTIN 300 MG PO CAPS: 300.0000 mg | ORAL_CAPSULE | Freq: Two times a day (BID) | ORAL | Status: DC

## 2014-09-23 NOTE — Progress Notes (Signed)
[facility] [id number] Progress Note  [name] [id number] 69 y.o.  09/23/2014 10:47 AM  Chief Complaint:  Medication management and follow-up.    History of Present Illness: [name] came for her followup appointment with her husband. She is compliant with  Cymbalta 90 mg daily and Klonopin twice a day and sometimes she takes third one.  She likes the Neurontin twice a day.  She denies any major panic attack or nervousness but sometime she do not get anxious.  She is happy that her daughter is doing better.  She recently seen her primary care physician who decided to manage her pain medication and she is happy that she does not have to see a pain specialist out of the town.  Her sleep is good.  She denies any irritability or any anger.  She denies any feeling of hopelessness or worthlessness.  Her appetite is okay.  Her vitals are stable.  Patient denies drinking or using any illegal substances.  Suicidal Ideation: No Plan Formed: No Patient has means to carry out plan: No  Homicidal Ideation: No Plan Formed: No Patient has means to carry out plan: No  ROS Psychiatric: Agitation: No Hallucination: No Depressed Mood: No Insomnia: No Hypersomnia: No Altered Concentration: No Feels Worthless: No Grandiose Ideas: No Belief In Special Powers: No New/Increased Substance Abuse: No Compulsions: No  Neurologic: Headache: No Seizure: No Paresthesias: No   Outpatient Encounter Prescriptions as of 09/23/2014  Medication Sig  . butalbital-acetaminophen-caffeine (FIORICET, ESGIC) 50-325-40 MG per tablet Take 2 tablets by mouth 2 (two) times daily as needed for headache or migraine.  . cholecalciferol (VITAMIN D) 1000 UNITS tablet Take 1,000 Units by mouth daily.  . clonazePAM (KLONOPIN) 1 MG tablet Take 1 tab twice a day and 3rd as needed for severe anxiety  . cyclobenzaprine (FLEXERIL) 10 MG tablet Take 10 mg by mouth 3 (three) times daily as needed for muscle spasms.    . DULoxetine (CYMBALTA) 30 MG capsule Take 3 capsule daily  . gabapentin (NEURONTIN) 300 MG capsule Take 1 capsule (300 mg total) by mouth 2 (two) times daily.  . meclizine (ANTIVERT) 25 MG tablet Take 1 tablet (25 mg total) by mouth 4 (four) times daily.  . Oxycodone HCl 10 MG TABS Take 10 mg by mouth every 4 (four) hours as needed (pain).   . simvastatin (ZOCOR) 40 MG tablet Take 40 mg by mouth daily at 6 PM.   . ondansetron (ZOFRAN) 8 MG tablet Take 1 tablet (8 mg total) by mouth every 4 (four) hours as needed. (Patient not taking: Reported on 09/23/2014)  . [DISCONTINUED] clonazePAM (KLONOPIN) 1 MG tablet Take 1 tab twice a day and 3rd as needed for severe anxiety  . [DISCONTINUED] DULoxetine (CYMBALTA) 30 MG capsule Take 3 capsule daily (Patient taking differently: Take 30 mg by mouth 3 (three) times daily. Take 3 capsule daily)  . [DISCONTINUED] gabapentin (NEURONTIN) 300 MG capsule TAKE 1 CAPSULE BY MOUTH TWICE DAILY.   Recent Results (from the past 2160 hour(s))  CBG monitoring, ED     Status: Abnormal   Collection Time: 09/13/14 11:48 AM  Result Value Ref Range   Glucose-Capillary 106 (H) 70 - 99 mg/dL   Past Psychiatric History/Hospitalization(s): Patient has a long history of anxiety disorder.  She was getting her psychiatric medication from her primary care physician until stopped working.  She has taken Effexor in the past.we had tried Valium and she has taken Zoloft in the past but stopped working.  Bipolar Disorder: No Depression: Yes Mania: No Psychosis: No Schizophrenia: No Personality Disorder: No Hospitalization for psychiatric illness: No History of Electroconvulsive Shock Therapy: No Prior Suicide Attempts: No   Past Medical Family, Social History:  Patient has history of hyperlipidemia, sinusitis, hypothyroidism, seasonal allergies and chronic pain.  She is seeing pain management for her chronic back pain.  She's unemployed.  She lives with her husband who is very  involved in her treatment.  Patient has a daughter and she is very attached with her daughter.  Her primary care physician is Dr. Margo Murillo   Physical Exam: Constitutional:  BP 134/70 mmHg  Pulse 80  Ht 5\' 3"  (1.6 m)  Wt 137 lb 12.8 oz (62.506 kg)  BMI 24.42 kg/m2  SpO2 97%  General Appearance: alert, oriented, no acute distress and well nourished  Musculoskeletal: Strength & Muscle Tone: Complain of back pain Gait & Station: normal Patient leans: N/A  Psychiatric: Speech (describe rate, volume, coherence, spontaneity, and abnormalities if any): Clear and coherent with normal tone and volume.  Thought Process (describe rate, content, abstract reasoning, and computation): Logical and goal-directed.  Associations: Coherent, Relevant and Intact  Thoughts: normal  Mental Status: Orientation: oriented to person, place, time/date and situation Mood & Affect: normal affect Attention Span & Concentration: Okay  Established Problem, Stable/Improving (1), Review of Psycho-Social Stressors (1), Review of Last Therapy Session (1) and Review of Medication Regimen & Side Effects (2)  Assessment: Axis I: Generalized anxiety disorder.    Axis II: Deferred  Axis III:  Patient Active Problem List   Diagnosis Date Noted  . Back pain 05/05/2012  . Fibromyalgia 05/05/2012  . Chest pressure 04/03/2012  . Carotid bruit 04/03/2012  . Anxiety 10/05/2011    Plan: Patient is doing better on her current medication.  I will continue Cymbalta 90 mg daily, Neurontin 300 mg BID and Klonopin 1 mg twice a day and she can take third as needed for severe anxiety.  We will provide 10 extra tablet per month .  Discussed medication side effects specialty postural hypotension, dependency, tolerance and withdrawal symptoms.  Encouraged to keep appointment with pain management.  I will see her again in 3 months.    [name] T., MD 09/23/2014

## 2014-11-19 ENCOUNTER — Other Ambulatory Visit (HOSPITAL_COMMUNITY): Payer: Self-pay | Admitting: Psychiatry

## 2014-11-20 ENCOUNTER — Other Ambulatory Visit (HOSPITAL_COMMUNITY): Payer: Self-pay | Admitting: Psychiatry

## 2014-11-20 NOTE — Telephone Encounter (Signed)
Patient was given medication on April 11  With 2 additional refills. Too soon to refill.

## 2014-12-07 ENCOUNTER — Other Ambulatory Visit (HOSPITAL_COMMUNITY): Payer: Self-pay | Admitting: Psychiatry

## 2014-12-12 ENCOUNTER — Other Ambulatory Visit (HOSPITAL_COMMUNITY): Payer: Self-pay | Admitting: Psychiatry

## 2014-12-17 ENCOUNTER — Other Ambulatory Visit (HOSPITAL_COMMUNITY): Payer: Self-pay | Admitting: Psychiatry

## 2014-12-17 DIAGNOSIS — F411 Generalized anxiety disorder: Secondary | ICD-10-CM

## 2014-12-18 NOTE — Telephone Encounter (Signed)
Met with Dr. Lovena Le who authorized a one time refill of patient's prescribed Clonazepam and Gabapentin.  E-scribed in a new one time Gabapentin order to [place] and in a one time Clonazepam refill with Monica Martinez, Software engineer at [facility] per Dr. Tanna Furry orders.  Patient to return on 12/30/14 for evaluation with Dr. Adele Schilder.  Telephone call with patient to inform her new orders were sent to her pharmacy this date and patient stated thinking she had enough of other medications until she returns to see Dr. Adele Schilder on 12/30/14.  Patient to call back as needed.

## 2014-12-23 ENCOUNTER — Ambulatory Visit (HOSPITAL_COMMUNITY): Payer: Self-pay | Admitting: Psychiatry

## 2014-12-30 ENCOUNTER — Ambulatory Visit (INDEPENDENT_AMBULATORY_CARE_PROVIDER_SITE_OTHER): Payer: No Typology Code available for payment source | Admitting: Psychiatry

## 2014-12-30 ENCOUNTER — Encounter (HOSPITAL_COMMUNITY): Payer: Self-pay | Admitting: Psychiatry

## 2014-12-30 VITALS — BP 116/72 | HR 87 | Ht 63.0 in | Wt 142.6 lb

## 2014-12-30 DIAGNOSIS — F411 Generalized anxiety disorder: Secondary | ICD-10-CM

## 2014-12-30 MED ORDER — CLONAZEPAM 1 MG PO TABS: ORAL_TABLET | ORAL | Status: DC

## 2014-12-30 MED ORDER — DULOXETINE HCL 60 MG PO CPEP: ORAL_CAPSULE | ORAL | Status: DC

## 2014-12-30 MED ORDER — DULOXETINE HCL 30 MG PO CPEP: ORAL_CAPSULE | ORAL | Status: DC

## 2014-12-30 MED ORDER — GABAPENTIN 300 MG PO CAPS: 300.0000 mg | ORAL_CAPSULE | Freq: Two times a day (BID) | ORAL | Status: DC

## 2014-12-30 NOTE — Progress Notes (Signed)
[name] [id number] Progress Note  [name] [id number] 69 y.o.  12/30/2014 2:40 PM  Chief Complaint:  Medication management and follow-up.    History of Present Illness: [name] came for her followup appointment with her husband.  She denies any major panic attack or anxiety attack.  Her husband endorse that she is taking her medication as prescribed and she is doing much better.  She is taking pain medication which is prescribed by her primary care physician Dr. Dwana MelenaZack Murillo.  She sleeping good.  She denies any crying spells, feeling of hopelessness or worthlessness.  She reported her daughter is doing very well.  Her appetite is okay.  Her vitals are stable.  Patient denies drinking or using any illegal substances.  She is taking Neurontin, Zoloft and Klonopin.  She usually she takes Klonopin twice a day but there are times that she needed extra .  She does not ask for early refills.  Recently she had blood work however she forgot to bring results.  Her husband promised that he will mail results.  Suicidal Ideation: No Plan Formed: No Patient has means to carry out plan: No  Homicidal Ideation: No Plan Formed: No Patient has means to carry out plan: No  Review of Systems  Musculoskeletal: Positive for back pain.   Psychiatric: Agitation: No Hallucination: No Depressed Mood: No Insomnia: No Hypersomnia: No Altered Concentration: No Feels Worthless: No Grandiose Ideas: No Belief In Special Powers: No New/Increased Substance Abuse: No Compulsions: No  Neurologic: Headache: No Seizure: No Paresthesias: No   Outpatient Encounter Prescriptions as of 12/30/2014  Medication Sig  . butalbital-acetaminophen-caffeine (FIORICET, ESGIC) 50-325-40 MG per tablet Take 2 tablets by mouth 2 (two) times daily as needed for headache or migraine.  . cholecalciferol (VITAMIN D) 1000 UNITS tablet Take 1,000 Units by mouth daily.  . clonazePAM (KLONOPIN) 1 MG tablet TAKE 1  TABLET BY MOUTH TWICE DAILY. MAY TAKE THIRD TABLET AS NEEDED FOR ANXIETY.  . cyclobenzaprine (FLEXERIL) 10 MG tablet Take 10 mg by mouth 3 (three) times daily as needed for muscle spasms.   . DULoxetine (CYMBALTA) 30 MG capsule TAKE 1 CAPSULE BY MOUTH ONCE DAILY.TAKE WITH 60MG .  Marland Kitchen. DULoxetine (CYMBALTA) 60 MG capsule TAKE (1) CAPSULE BY MOUTH EVERY DAY.TAKE WITH 30MG .  . gabapentin (NEURONTIN) 300 MG capsule Take 1 capsule (300 mg total) by mouth 2 (two) times daily.  . meclizine (ANTIVERT) 25 MG tablet Take 1 tablet (25 mg total) by mouth 4 (four) times daily.  . Oxycodone HCl 10 MG TABS Take 10 mg by mouth every 4 (four) hours as needed (pain).   . simvastatin (ZOCOR) 40 MG tablet Take 40 mg by mouth daily at 6 PM.   . [DISCONTINUED] clonazePAM (KLONOPIN) 1 MG tablet TAKE 1 TABLET BY MOUTH TWICE DAILY. MAY TAKE THIRD TABLET AS NEEDED FOR ANXIETY.  . [DISCONTINUED] DULoxetine (CYMBALTA) 30 MG capsule TAKE 1 CAPSULE BY MOUTH ONCE DAILY.TAKE WITH 60MG .  . [DISCONTINUED] DULoxetine (CYMBALTA) 60 MG capsule TAKE (1) CAPSULE BY MOUTH EVERY DAY.TAKE WITH 30MG .  . [DISCONTINUED] gabapentin (NEURONTIN) 300 MG capsule TAKE 1 CAPSULE BY MOUTH TWICE DAILY.  . [DISCONTINUED] ondansetron (ZOFRAN) 8 MG tablet Take 1 tablet (8 mg total) by mouth every 4 (four) hours as needed. (Patient not taking: Reported on 09/23/2014)   No facility-administered encounter medications on file as of 12/30/2014.   No results found for this or any previous visit (from the past 2160 hour(s)). Past Psychiatric History/Hospitalization(s): Patient has a  long history of anxiety disorder.  She was getting her psychiatric medication from her primary care physician until stopped working.  She has taken Effexor in the past.we had tried Valium and she has taken Zoloft in the past but stopped working. Bipolar Disorder: No Depression: Yes Mania: No Psychosis: No Schizophrenia: No Personality Disorder: No Hospitalization for psychiatric  illness: No History of Electroconvulsive Shock Therapy: No Prior Suicide Attempts: No   Past Medical Family, Social History:  Patient has history of hyperlipidemia, sinusitis, hypothyroidism, seasonal allergies and chronic pain.  She is seeing pain management for her chronic back pain.  She's unemployed.  She lives with her husband who is very involved in her treatment.  Patient has a daughter and she is very attached with her daughter.  Her primary care physician is [name]   Physical Exam: Constitutional:  BP 116/72 mmHg  Pulse 87  Ht  (1.6 m)  Wt 142 lb 9.6 oz (64.683 kg)  BMI 25.27 kg/m2  General Appearance: alert, oriented, no acute distress and well nourished  Musculoskeletal: Strength & Muscle Tone: Complain of back pain Gait & Station: normal Patient leans: N/A  Psychiatric: Speech (describe rate, volume, coherence, spontaneity, and abnormalities if any): Clear and coherent with normal tone and volume.  Thought Process (describe rate, content, abstract reasoning, and computation): Logical and goal-directed.  Associations: Coherent, Relevant and Intact  Thoughts: normal  Mental Status: Orientation: oriented to person, place, time/date and situation Mood & Affect: normal affect Attention Span & Concentration: Okay  Established Problem, Stable/Improving (1), Review of Psycho-Social Stressors (1), Review of Last Therapy Session (1) and Review of Medication Regimen & Side Effects (2)  Assessment: Axis I: Generalized anxiety disorder.    Axis II: Deferred  Axis III:  Patient Active Problem List   Diagnosis Date Noted  . Back pain 05/05/2012  . Fibromyalgia 05/05/2012  . Chest pressure 04/03/2012  . Carotid bruit 04/03/2012  . Anxiety 10/05/2011    Plan: Patient is doing better on her current medication.  She has no side effects of the medication.  Her anxiety is under control.  I will continue Cymbalta 90 mg daily, Neurontin 300 mg BID and Klonopin 1 mg  twice a day and she can take third as needed for severe anxiety.  We will provide 10 extra tablet per month .  Discussed medication side effects specialty postural hypotension, dependency, tolerance and withdrawal symptoms.  Encouraged to keep appointment with her primary care physician for pain management.  I will see her again in 3 months.    Giulian Goldring T., MD 12/30/2014

## 2015-02-04 ENCOUNTER — Other Ambulatory Visit (HOSPITAL_COMMUNITY): Payer: Self-pay | Admitting: Psychiatry

## 2015-02-05 ENCOUNTER — Other Ambulatory Visit (HOSPITAL_COMMUNITY): Payer: Self-pay | Admitting: Psychiatry

## 2015-02-05 DIAGNOSIS — F411 Generalized anxiety disorder: Secondary | ICD-10-CM

## 2015-02-05 MED ORDER — DULOXETINE HCL 30 MG PO CPEP: ORAL_CAPSULE | ORAL | Status: DC

## 2015-03-06 ENCOUNTER — Encounter (HOSPITAL_COMMUNITY): Payer: Self-pay | Admitting: Psychiatry

## 2015-03-06 ENCOUNTER — Ambulatory Visit (INDEPENDENT_AMBULATORY_CARE_PROVIDER_SITE_OTHER): Payer: No Typology Code available for payment source | Admitting: Psychiatry

## 2015-03-06 VITALS — BP 117/87 | HR 78 | Ht 63.0 in | Wt 142.0 lb

## 2015-03-06 DIAGNOSIS — F411 Generalized anxiety disorder: Secondary | ICD-10-CM

## 2015-03-06 MED ORDER — BUSPIRONE HCL 5 MG PO TABS: 30.0000 mg | ORAL_TABLET | Freq: Three times a day (TID) | ORAL | Status: DC

## 2015-03-06 NOTE — Progress Notes (Signed)
[facility] [id number] Progress Note  [name] [id number] 69 y.o.  03/06/2015 11:55 AM  Chief Complaint:  I don't think my medicine working.  I have a lot of anxiety and panic attack.    History of Present Illness: [name] came earlier than her scheduled appointment with her husband.  She's been experiencing increased panic attack and nervousness.  Despite taking medication she presented very nervous anxious and overwhelmed.  She has a panic attack at her primary care physician's office.  She do not describe any trigger factor and endorse that panic attack usually last for 30 minutes.  Patient and her husband believed that Neurontin is not working.  They want to try Xanax .  I explained that she is already taking Klonopin and 2 benzodiazepine cannot be given.  Patient is reluctant to stop Klonopin at this point and now agreed to try something different other than Neurontin.  She also noticed increased weight gain since taking the Neurontin.  She is not interested in going up on Neurontin due to weight gain.  She sleeping good.  She denies any feeling of hopelessness or worthlessness.  She denies any paranoia or any hallucination.  She is taking Cymbalta denies any tremors or shakes.  Her appetite is okay and her weight has been stable but overall she has gained weight from past one year.  Patient is also concerned because she received a letter from insurance company that this [name] may not longer be in network and she has to find a different psychiatrist.  Patient is very concerned and she does not want to change her psychiatrist.  Suicidal Ideation: No Plan Formed: No Patient has means to carry out plan: No  Homicidal Ideation: No Plan Formed: No Patient has means to carry out plan: No  Review of Systems  Constitutional: Negative for weight loss.  HENT: Negative.   Cardiovascular: Negative.   Musculoskeletal: Positive for back pain.  Skin: Negative.   Neurological:  Negative for dizziness, tingling and tremors.  Psychiatric/Behavioral: Negative for suicidal ideas. The patient is nervous/anxious.    Psychiatric: Agitation: No Hallucination: No Depressed Mood: No Insomnia: No Hypersomnia: No Altered Concentration: No Feels Worthless: No Grandiose Ideas: No Belief In Special Powers: No New/Increased Substance Abuse: No Compulsions: No  Neurologic: Headache: No Seizure: No Paresthesias: No   Outpatient Encounter Prescriptions as of 03/06/2015  Medication Sig  . busPIRone (BUSPAR) 5 MG tablet Take 6 tablets (30 mg total) by mouth 3 (three) times daily.  . butalbital-acetaminophen-caffeine (FIORICET, ESGIC) 50-325-40 MG per tablet Take 2 tablets by mouth 2 (two) times daily as needed for headache or migraine.  . cholecalciferol (VITAMIN D) 1000 UNITS tablet Take 1,000 Units by mouth daily.  . clonazePAM (KLONOPIN) 1 MG tablet TAKE 1 TABLET BY MOUTH TWICE DAILY. MAY TAKE THIRD TABLET AS NEEDED FOR ANXIETY.  . cyclobenzaprine (FLEXERIL) 10 MG tablet Take 10 mg by mouth 3 (three) times daily as needed for muscle spasms.   . DULoxetine (CYMBALTA) 30 MG capsule TAKE 1 CAPSULE BY MOUTH ONCE DAILY.TAKE WITH .  . DULoxetine (CYMBALTA) 60 MG capsule TAKE (1) CAPSULE BY MOUTH EVERY DAY.TAKE WITH .  . meclizine (ANTIVERT) 25 MG tablet Take 1 tablet (25 mg total) by mouth 4 (four) times daily.  . Oxycodone HCl 10 MG TABS Take 10 mg by mouth every 4 (four) hours as needed (pain).   . simvastatin (ZOCOR) 40 MG tablet Take 40 mg by mouth daily at 6 PM.   . [  DISCONTINUED] gabapentin (NEURONTIN) 300 MG capsule Take 1 capsule (300 mg total) by mouth 2 (two) times daily.   No facility-administered encounter medications on file as of 03/06/2015.   No results found for this or any previous visit (from the past 2160 hour(s)). Past Psychiatric History/Hospitalization(s): Patient has a long history of anxiety disorder.  She was getting her psychiatric medication  from her primary care physician until stopped working.  She has taken Effexor in the past.we had tried Valium and she has taken Zoloft in the past but stopped working. Bipolar Disorder: No Depression: Yes Mania: No Psychosis: No Schizophrenia: No Personality Disorder: No Hospitalization for psychiatric illness: No History of Electroconvulsive Shock Therapy: No Prior Suicide Attempts: No   Past Medical Family, Social History:  Patient has history of hyperlipidemia, sinusitis, hypothyroidism, seasonal allergies and chronic pain.  She is seeing pain management for her chronic back pain.  She's unemployed.  She lives with her husband who is very involved in her treatment.  Patient has a daughter and she is very attached with her daughter.  Her primary care physician is Dr. [name]   Physical Exam: Constitutional:  BP 117/87 mmHg  Pulse 78  Ht  (1.6 m)  Wt 142 lb (64.411 kg)  BMI 25.16 kg/m2  General Appearance: alert, oriented, no acute distress and well nourished  Musculoskeletal: Strength & Muscle Tone: Complain of back pain Gait & Station: normal Patient leans: N/A  Mental status examination: Patient is casually dressed and groomed.  She described her mood anxious and nervous.  Her affect is constricted.  She maintained fair eye contact.  She is cooperative.  Her speech is slow but clear and coherent.  She denies any auditory or visual hallucination.  She denies any active or passive suicidal thoughts or homicidal thoughts.  There were no flight of ideas or any loose association.  Her attention concentration is fair.  Her psychomotor activity is slow.  Her attention and concentration is fair.  Her fund of knowledge is good.  She is alert and oriented 3.  She has no tremors, shakes or any EPS.  Her insight judgment and impulse control is okay.  Established Problem, Stable/Improving (1), Review of Psycho-Social Stressors (1), Established Problem, Worsening (2), Review of Last Therapy  Session (1), Review of Medication Regimen & Side Effects (2) and Review of New Medication or Change in Dosage (2)  Assessment: Axis I: Generalized anxiety disorder.    Axis II: Deferred  Axis III:  Patient Active Problem List   Diagnosis Date Noted  . Back pain 05/05/2012  . Fibromyalgia 05/05/2012  . Chest pressure 04/03/2012  . Carotid bruit 04/03/2012  . Anxiety 10/05/2011    Plan: Reassurance given.  Recommended to discontinue Neurontin and try BuSpar 5 mg up to 3 times a day.  Discuss that she cannot take 2 benzodiazepine.  If she decided to take Xanax then we need to stop Klonopin.  Patient does not want to discontinue Klonopin.  She is not interested in counseling.  Recommended Cymbalta 90 mg daily, Klonopin 1 mg twice a day  and third as needed.  Recommended to call us back if she has any question or any concern.  Discuss safety plan that anytime having active suicidal thoughts or homicidal thoughts and she need to call 911.  Patient may need a different Carlyann Placide if her insurance does not approve continue her care in this office. Follow up 4 weeks.   ARFEEN,SYED T., MD 03/06/2015

## 2015-03-07 ENCOUNTER — Other Ambulatory Visit (HOSPITAL_COMMUNITY): Payer: Self-pay | Admitting: Psychiatry

## 2015-03-09 ENCOUNTER — Other Ambulatory Visit (HOSPITAL_COMMUNITY): Payer: Self-pay | Admitting: Psychiatry

## 2015-03-10 ENCOUNTER — Other Ambulatory Visit (HOSPITAL_COMMUNITY): Payer: Self-pay | Admitting: Psychiatry

## 2015-03-10 ENCOUNTER — Telehealth (HOSPITAL_COMMUNITY): Payer: Self-pay

## 2015-03-10 DIAGNOSIS — F411 Generalized anxiety disorder: Secondary | ICD-10-CM

## 2015-03-10 MED ORDER — BUSPIRONE HCL 5 MG PO TABS: 5.0000 mg | ORAL_TABLET | Freq: Three times a day (TID) | ORAL | Status: DC

## 2015-03-10 NOTE — Telephone Encounter (Signed)
Telephone call with Lorin Picket, pharmacist at [facility] who was questioning patient's order for Buspar , 6 tablets three times a day.  Verified this was not correct by record review and with discussion with Dr. Lolly Mustache.  Agreed to send in a corrected order authorized by Dr. Lolly Mustache this date for Buspar , one three times a day and order e-scribed as approved.  New Buspar order e-scribed to [facility] as ordered by Dr. Lolly Mustache and prescription from 03/04/15 voided out.

## 2015-03-21 ENCOUNTER — Other Ambulatory Visit (HOSPITAL_COMMUNITY): Payer: Self-pay | Admitting: Psychiatry

## 2015-04-01 ENCOUNTER — Ambulatory Visit (INDEPENDENT_AMBULATORY_CARE_PROVIDER_SITE_OTHER): Payer: No Typology Code available for payment source | Admitting: Psychiatry

## 2015-04-01 ENCOUNTER — Ambulatory Visit (HOSPITAL_COMMUNITY): Payer: Self-pay | Admitting: Psychiatry

## 2015-04-01 ENCOUNTER — Encounter (HOSPITAL_COMMUNITY): Payer: Self-pay | Admitting: Psychiatry

## 2015-04-01 VITALS — BP 124/76 | HR 106 | Ht 63.0 in | Wt 143.8 lb

## 2015-04-01 DIAGNOSIS — F411 Generalized anxiety disorder: Secondary | ICD-10-CM | POA: Diagnosis not present

## 2015-04-01 MED ORDER — DULOXETINE HCL 30 MG PO CPEP: ORAL_CAPSULE | ORAL | Status: DC

## 2015-04-01 MED ORDER — TRAZODONE HCL 100 MG PO TABS: ORAL_TABLET | ORAL | Status: DC

## 2015-04-01 MED ORDER — DULOXETINE HCL 60 MG PO CPEP: ORAL_CAPSULE | ORAL | Status: DC

## 2015-04-01 MED ORDER — CLONAZEPAM 1 MG PO TABS: 0.5000 mg | ORAL_TABLET | Freq: Three times a day (TID) | ORAL | Status: DC | PRN

## 2015-04-01 NOTE — Progress Notes (Signed)
[facility] [id number] Progress Note  [name] [id number] 69 y.o.  04/01/2015 2:56 PM  Chief Complaint:  I want to try trazodone.  I don't think BuSpar is working.  I have a lot of anxiety and panic attack.    History of Present Illness: [name] came for her appointment with her husband.  She tried BuSpar but she does not feel any improvement.  She tried her husband's trazodone 300 mg during the day and after that she felt much improved and better.  She was able to calm down and felt very relaxed.  Now she wants to try trazodone.  She is compliant with Cymbalta 90 mg daily, Klonopin 1 mg twice a day and sometime she takes her dose.  She admitted poor sleep, racing thoughts and feeling nervousness and feeling overwhelmed.  She apologizes for taking her husband's trazodone but she mentioned that she was very nervous and anxious and desperate.  So far she has taken 3 times her husband's trazodone 300 mg and every time when she took it worked very well.  She did not report any side effects, sedation, dizziness or any tremors.  She is no longer taking Neurontin.  She denies any paranoia, hallucination, feeling hopelessness or worthlessness.  She denies any suicidal thoughts or homicidal thought.  Her appetite is okay.  Her vitals are stable.  Patient denies drinking or using any illegal substances.    Suicidal Ideation: No Plan Formed: No Patient has means to carry out plan: No  Homicidal Ideation: No Plan Formed: No Patient has means to carry out plan: No  Review of Systems  Constitutional: Negative for weight loss.  HENT: Negative.   Cardiovascular: Negative.   Musculoskeletal: Positive for back pain.  Skin: Negative.   Neurological: Negative for dizziness, tingling and tremors.  Psychiatric/Behavioral: Negative for suicidal ideas. The patient is nervous/anxious and has insomnia.    Psychiatric: Agitation: No Hallucination: No Depressed Mood: No Insomnia:  Yes Hypersomnia: No Altered Concentration: No Feels Worthless: No Grandiose Ideas: No Belief In Special Powers: No New/Increased Substance Abuse: No Compulsions: No  Neurologic: Headache: No Seizure: No Paresthesias: No   Outpatient Encounter Prescriptions as of 04/01/2015  Medication Sig  . butalbital-acetaminophen-caffeine (FIORICET, ESGIC) 50-325-40 MG per tablet Take 2 tablets by mouth 2 (two) times daily as needed for headache or migraine.  . cholecalciferol (VITAMIN D) 1000 UNITS tablet Take 1,000 Units by mouth daily.  . clonazePAM (KLONOPIN) 1 MG tablet Take 0.5 tablets (0.5 mg total) by mouth 3 (three) times daily as needed.  . cyclobenzaprine (FLEXERIL) 10 MG tablet Take 10 mg by mouth 3 (three) times daily as needed for muscle spasms.   . DULoxetine (CYMBALTA) 30 MG capsule TAKE 1 CAPSULE BY MOUTH ONCE DAILY.TAKE WITH 60MG .  Marland Kitchen. DULoxetine (CYMBALTA) 60 MG capsule TAKE (1) CAPSULE BY MOUTH EVERY DAY.TAKE WITH 30MG .  . meclizine (ANTIVERT) 25 MG tablet Take 1 tablet (25 mg total) by mouth 4 (four) times daily.  . Oxycodone HCl 10 MG TABS Take 10 mg by mouth every 4 (four) hours as needed (pain).   . simvastatin (ZOCOR) 40 MG tablet Take 40 mg by mouth daily at 6 PM.   . traZODone (DESYREL) 100 MG tablet Take 1-2 tab at daily  . [DISCONTINUED] busPIRone (BUSPAR) 5 MG tablet Take 1 tablet (5 mg total) by mouth 3 (three) times daily.  . [DISCONTINUED] clonazePAM (KLONOPIN) 1 MG tablet TAKE 1 TABLET BY MOUTH TWICE DAILY. MAY TAKE THIRD TABLET AS NEEDED  FOR ANXIETY. (Patient taking differently: 0.5 mg 3 (three) times daily as needed. TAKE 1 TABLET BY MOUTH TWICE DAILY. MAY TAKE THIRD TABLET AS NEEDED FOR ANXIETY.)  . [DISCONTINUED] DULoxetine (CYMBALTA) 30 MG capsule TAKE 1 CAPSULE BY MOUTH ONCE DAILY.TAKE WITH .  . [DISCONTINUED] DULoxetine (CYMBALTA) 60 MG capsule TAKE (1) CAPSULE BY MOUTH EVERY DAY.TAKE WITH .   No facility-administered encounter medications on file as  of 04/01/2015.   No results found for this or any previous visit (from the past 2160 hour(s)). Past Psychiatric History/Hospitalization(s): Patient has a long history of anxiety disorder.  She was getting her psychiatric medication from her primary care physician until stopped working.  She has taken Effexor in the past.we had tried Valium and she has taken Zoloft in the past but stopped working.  Recently we tried BuSpar but it did not help. Bipolar Disorder: No Depression: Yes Mania: No Psychosis: No Schizophrenia: No Personality Disorder: No Hospitalization for psychiatric illness: No History of Electroconvulsive Shock Therapy: No Prior Suicide Attempts: No   Past Medical Family, Social History:  Patient has history of hyperlipidemia, sinusitis, hypothyroidism, seasonal allergies and chronic pain.  She is seeing pain management for her chronic back pain.  She's unemployed.  She lives with her husband who is very involved in her treatment.  Patient has a daughter and she is very attached with her daughter.  Her primary care physician is Dr. [name]   Physical Exam: Constitutional:  BP 124/76 mmHg  Pulse 106  Ht  (1.6 m)  Wt 143 lb 12.8 oz (65.227 kg)  BMI 25.48 kg/m2  General Appearance: alert, oriented, no acute distress and well nourished  Musculoskeletal: Strength & Muscle Tone: Complain of back pain Gait & Station: normal Patient leans: N/A  Mental status examination: Patient is casually dressed and groomed.  She described her mood anxious and nervous.  Her affect is constricted.  She maintained fair eye contact.  She is cooperative.  Her speech is slow but clear and coherent.  She denies any auditory or visual hallucination.  She denies any active or passive suicidal thoughts or homicidal thoughts.  There were no flight of ideas or any loose association.  Her attention concentration is fair.  Her psychomotor activity is slow.  Her attention and concentration is fair.  Her  fund of knowledge is good.  She is alert and oriented 3.  She has no tremors, shakes or any EPS.  Her insight judgment and impulse control is okay.  Established Problem, Stable/Improving (1), Review of Psycho-Social Stressors (1), Established Problem, Worsening (2), Review of Last Therapy Session (1), Review of Medication Regimen & Side Effects (2) and Review of New Medication or Change in Dosage (2)  Assessment: Axis I: Generalized anxiety disorder.    Axis II: Deferred  Axis III:  Patient Active Problem List   Diagnosis Date Noted  . Back pain 05/05/2012  . Fibromyalgia 05/05/2012  . Chest pressure 04/03/2012  . Carotid bruit 04/03/2012  . Anxiety 10/05/2011    Plan: I will discontinue BuSpar since it is not working.  We will try trazodone 100 mg and if does not feel improved and she can take second dose.  She had tried her husband's 300 mg trazodone which works very well for her and she did not report any side effects.  I have a long discussion about using someone's else medication.  Patient apologizes but promised that she will not do it in the future.  I also suggested to  cut down Klonopin because together with trazodone it may cause excessive sedation, postural hypotension and dizziness.  Patient agreed.  We will cut down Klonopin 0.5 mg 3 times a day.  She will continue Cymbalta 90 mg daily .  Discussed medication side effects and benefits.  Patient is not interested in counseling.  Recommended to call us back if she has any question or any concern.  Follow-up in 6 weeks. Discuss safety plan that anytime having active suicidal thoughts or homicidal thoughts and she need to call 911.    Kennon Encinas T., MD 04/01/2015

## 2015-05-13 ENCOUNTER — Encounter (HOSPITAL_COMMUNITY): Payer: Self-pay | Admitting: Psychiatry

## 2015-05-13 ENCOUNTER — Other Ambulatory Visit (HOSPITAL_COMMUNITY): Payer: Self-pay | Admitting: Internal Medicine

## 2015-05-13 ENCOUNTER — Ambulatory Visit (INDEPENDENT_AMBULATORY_CARE_PROVIDER_SITE_OTHER): Payer: No Typology Code available for payment source | Admitting: Psychiatry

## 2015-05-13 VITALS — BP 94/61 | HR 91 | Ht 63.0 in | Wt 139.6 lb

## 2015-05-13 DIAGNOSIS — F411 Generalized anxiety disorder: Secondary | ICD-10-CM | POA: Diagnosis not present

## 2015-05-13 DIAGNOSIS — R1310 Dysphagia, unspecified: Secondary | ICD-10-CM

## 2015-05-13 MED ORDER — DULOXETINE HCL 60 MG PO CPEP: ORAL_CAPSULE | ORAL | Status: DC

## 2015-05-13 MED ORDER — CLONAZEPAM 0.5 MG PO TABS: 0.5000 mg | ORAL_TABLET | Freq: Two times a day (BID) | ORAL | Status: DC

## 2015-05-13 MED ORDER — GABAPENTIN 300 MG PO CAPS: 300.0000 mg | ORAL_CAPSULE | ORAL | Status: DC | PRN

## 2015-05-13 MED ORDER — DULOXETINE HCL 30 MG PO CPEP
ORAL_CAPSULE | ORAL | Status: DC
Start: 2015-05-13 — End: 2015-08-13

## 2015-05-13 NOTE — Progress Notes (Signed)
[name] [phone] Progress Note  [name] [id number] 69 y.o.  05/13/2015 1:04 PM  Chief Complaint:  My husband told wrong medication.  It was not trazodone.  He actually give me gabapentin which helped my anxiety.     History of Present Illness: [name] came for her appointment with her husband.  Patient and her husband mentioned that on the last visit she was given trazodone by her husband which helps her a lot.  But now today patient and her husband apologize and mentioned it was actually gabapentin 300 mg .  Her husband gets gabapentin from [facility] .  Patient like to try gabapentin .  She is not taking trazodone which was given on the last visit.  She is also not taking BuSpar.  She is taking Cymbalta 90 mg daily.  She had cut down Klonopin and taking 0.5 mg twice a day.  Overall she feels much better.  She has taken a few times gabapentin 300 mg from her husband and she daily feet much better.  Her anxiety go away.  She still have anxiety and panic attack.  She had a good Thanksgiving but she is concerned about Christmas because all of her family members are going to [place] for Christmas.  She is sleeping better without trazodone.  She denies any paranoia, anger or any feeling of hopelessness or worthlessness.  Her energy level is okay.  Patient denies drinking or using any illegal substances.  Her appetite is okay.  Her vitals are stable.    Suicidal Ideation: No Plan Formed: No Patient has means to carry out plan: No  Homicidal Ideation: No Plan Formed: No Patient has means to carry out plan: No  Review of Systems  Constitutional: Negative for weight loss.  HENT: Negative.   Cardiovascular: Negative.   Musculoskeletal: Positive for back pain.  Skin: Negative.   Neurological: Negative for dizziness, tingling and tremors.  Psychiatric/Behavioral: Negative for suicidal ideas. The patient is nervous/anxious and has insomnia.    Psychiatric: Agitation:  No Hallucination: No Depressed Mood: No Insomnia: No Hypersomnia: No Altered Concentration: No Feels Worthless: No Grandiose Ideas: No Belief In Special Powers: No New/Increased Substance Abuse: No Compulsions: No  Neurologic: Headache: No Seizure: No Paresthesias: No   Outpatient Encounter Prescriptions as of 05/13/2015  Medication Sig  . butalbital-acetaminophen-caffeine (FIORICET, ESGIC) 50-325-40 MG per tablet Take 2 tablets by mouth 2 (two) times daily as needed for headache or migraine.  . cholecalciferol (VITAMIN D) 1000 UNITS tablet Take 1,000 Units by mouth daily.  . clonazePAM (KLONOPIN) 0.5 MG tablet Take 1 tablet (0.5 mg total) by mouth 2 (two) times daily.  . cyclobenzaprine (FLEXERIL) 10 MG tablet Take 10 mg by mouth 3 (three) times daily as needed for muscle spasms.   . DULoxetine (CYMBALTA) 30 MG capsule TAKE 1 CAPSULE BY MOUTH ONCE DAILY.TAKE WITH .  . DULoxetine (CYMBALTA) 60 MG capsule TAKE (1) CAPSULE BY MOUTH EVERY DAY.TAKE WITH .  . gabapentin (NEURONTIN) 300 MG capsule Take 1 capsule (300 mg total) by mouth as needed (anxiety).  Marland Kitchen levothyroxine (SYNTHROID, LEVOTHROID) 25 MCG tablet   . meclizine (ANTIVERT) 25 MG tablet Take 1 tablet (25 mg total) by mouth 4 (four) times daily.  . Oxycodone HCl 10 MG TABS Take 10 mg by mouth every 4 (four) hours as needed (pain).   . simvastatin (ZOCOR) 40 MG tablet Take 40 mg by mouth daily at 6 PM.   . [DISCONTINUED] clonazePAM (KLONOPIN) 1 MG tablet  Take 0.5 tablets (0.5 mg total) by mouth 3 (three) times daily as needed.  . [DISCONTINUED] DULoxetine (CYMBALTA) 30 MG capsule TAKE 1 CAPSULE BY MOUTH ONCE DAILY.TAKE WITH .  . [DISCONTINUED] DULoxetine (CYMBALTA) 60 MG capsule TAKE (1) CAPSULE BY MOUTH EVERY DAY.TAKE WITH .  . [DISCONTINUED] traZODone (DESYREL) 100 MG tablet Take 1-2 tab at daily   No facility-administered encounter medications on file as of 05/13/2015.   No results found for this or any  previous visit (from the past 2160 hour(s)). Past Psychiatric History/Hospitalization(s): Patient has a long history of anxiety disorder.  She was getting her psychiatric medication from her primary care physician until stopped working.  She has taken Effexor in the past.we had tried Valium and she has taken Zoloft in the past but stopped working.  Recently we tried BuSpar but it did not help. Bipolar Disorder: No Depression: Yes Mania: No Psychosis: No Schizophrenia: No Personality Disorder: No Hospitalization for psychiatric illness: No History of Electroconvulsive Shock Therapy: No Prior Suicide Attempts: No   Past Medical Family, Social History:  Patient has history of hyperlipidemia, sinusitis, hypothyroidism, seasonal allergies and chronic pain.  She is seeing pain management for her chronic back pain.  She's unemployed.  She lives with her husband who is very involved in her treatment.  Patient has a daughter and she is very attached with her daughter.  Her primary care physician is Dr. [name]   Physical Exam: Constitutional:  BP 94/61 mmHg  Pulse 91  Ht  (1.6 m)  Wt 139 lb 9.6 oz (63.322 kg)  BMI 24.74 kg/m2  General Appearance: alert, oriented, no acute distress and well nourished  Musculoskeletal: Strength & Muscle Tone: Complain of back pain Gait & Station: normal Patient leans: N/A  Mental status examination: Patient is casually dressed and groomed.  She described her mood anxious and nervous.  Her affect is constricted.  She maintained fair eye contact.  She is cooperative.  Her speech is slow but clear and coherent.  She denies any auditory or visual hallucination.  She denies any active or passive suicidal thoughts or homicidal thoughts.  There were no flight of ideas or any loose association.  Her attention concentration is fair.  Her psychomotor activity is slow.  Her attention and concentration is fair.  Her fund of knowledge is good.  She is alert and oriented  3.  She has no tremors, shakes or any EPS.  Her insight judgment and impulse control is okay.  Established Problem, Stable/Improving (1), Review of Last Therapy Session (1), Review of Medication Regimen & Side Effects (2) and Review of New Medication or Change in Dosage (2)  Assessment: Axis I: Generalized anxiety disorder.    Axis II: Deferred  Axis III:  Patient Active Problem List   Diagnosis Date Noted  . Back pain 05/05/2012  . Fibromyalgia 05/05/2012  . Chest pressure 04/03/2012  . Carotid bruit 04/03/2012  . Anxiety 10/05/2011    Plan: I would discontinue trazodone .  Patient like to try gabapentin as needed to help her anxiety and panic attack.  Discussed medication side effects especially gabapentin can cause swelling, weight gain .  Patient like to use as a when necessary.  She like to continue Cymbalta 90 mg and Klonopin 0.5 mg twice a day.  I will provide gabapentin 300 mg as needed .  Discussed medication side effects and benefits.  Recommended to call us back if she has any question or any concern. Patient is not  interested in counseling.  Follow-up in 3 months. Discuss safety plan that anytime having active suicidal thoughts or homicidal thoughts and she need to call 911.    Braydee Shimkus T., MD 05/13/2015

## 2015-05-16 ENCOUNTER — Ambulatory Visit (HOSPITAL_COMMUNITY)
Admission: RE | Admit: 2015-05-16 | Discharge: 2015-05-16 | Disposition: A | Discharge status: Home / Self Care | Payer: Medicare Other | Source: Ambulatory Visit | Attending: Internal Medicine | Admitting: Internal Medicine

## 2015-05-16 DIAGNOSIS — R1310 Dysphagia, unspecified: Secondary | ICD-10-CM

## 2015-05-17 ENCOUNTER — Emergency Department (HOSPITAL_COMMUNITY): Payer: Medicare Other

## 2015-05-17 ENCOUNTER — Encounter (HOSPITAL_COMMUNITY): Payer: Self-pay | Admitting: Emergency Medicine

## 2015-05-17 ENCOUNTER — Emergency Department (HOSPITAL_COMMUNITY)
Admission: EM | Admit: 2015-05-17 | Discharge: 2015-05-17 | Disposition: A | Discharge status: Home / Self Care | Payer: Medicare Other | Attending: Emergency Medicine | Admitting: Emergency Medicine

## 2015-05-17 DIAGNOSIS — R079 Chest pain, unspecified: Secondary | ICD-10-CM | POA: Diagnosis present

## 2015-05-17 DIAGNOSIS — Z8673 Personal history of transient ischemic attack (TIA), and cerebral infarction without residual deficits: Secondary | ICD-10-CM | POA: Insufficient documentation

## 2015-05-17 DIAGNOSIS — F419 Anxiety disorder, unspecified: Secondary | ICD-10-CM | POA: Diagnosis not present

## 2015-05-17 DIAGNOSIS — R0789 Other chest pain: Secondary | ICD-10-CM

## 2015-05-17 DIAGNOSIS — Z87891 Personal history of nicotine dependence: Secondary | ICD-10-CM | POA: Diagnosis not present

## 2015-05-17 DIAGNOSIS — Z79899 Other long term (current) drug therapy: Secondary | ICD-10-CM | POA: Diagnosis not present

## 2015-05-17 DIAGNOSIS — Z8709 Personal history of other diseases of the respiratory system: Secondary | ICD-10-CM | POA: Diagnosis not present

## 2015-05-17 DIAGNOSIS — E785 Hyperlipidemia, unspecified: Secondary | ICD-10-CM | POA: Insufficient documentation

## 2015-05-17 DIAGNOSIS — G8929 Other chronic pain: Secondary | ICD-10-CM | POA: Diagnosis not present

## 2015-05-17 LAB — D-DIMER, QUANTITATIVE (NOT AT ARMC): D DIMER QUANT: 0.76 ug{FEU}/mL — AB (ref 0.00–0.50)

## 2015-05-17 LAB — HEPATIC FUNCTION PANEL
ALT: 13 U/L — AB (ref 14–54)
AST: 19 U/L (ref 15–41)
Albumin: 3.7 g/dL (ref 3.5–5.0)
Alkaline Phosphatase: 66 U/L (ref 38–126)
BILIRUBIN DIRECT: 0.1 mg/dL (ref 0.1–0.5)
Indirect Bilirubin: 0.2 mg/dL — ABNORMAL LOW (ref 0.3–0.9)
Total Bilirubin: 0.3 mg/dL (ref 0.3–1.2)
Total Protein: 6.9 g/dL (ref 6.5–8.1)

## 2015-05-17 LAB — BASIC METABOLIC PANEL
ANION GAP: 9 (ref 5–15)
BUN: 8 mg/dL (ref 6–20)
CALCIUM: 9.1 mg/dL (ref 8.9–10.3)
CO2: 27 mmol/L (ref 22–32)
CREATININE: 0.84 mg/dL (ref 0.44–1.00)
Chloride: 105 mmol/L (ref 101–111)
GFR calc non Af Amer: >60 mL/min (ref >60)
Glucose, Bld: 129 mg/dL — ABNORMAL HIGH (ref 65–99)
Potassium: 3.6 mmol/L (ref 3.5–5.1)
Sodium: 141 mmol/L (ref 135–145)

## 2015-05-17 LAB — CBC
HCT: 36.4 % (ref 36.0–46.0)
HEMOGLOBIN: 12.3 g/dL (ref 12.0–15.0)
MCH: 32.3 pg (ref 26.0–34.0)
MCHC: 33.8 g/dL (ref 30.0–36.0)
MCV: 95.5 fL (ref 78.0–100.0)
PLATELETS: 236 10*3/uL (ref 150–400)
RBC: 3.81 MIL/uL — AB (ref 3.87–5.11)
RDW: 13.2 % (ref 11.5–15.5)
WBC: 7.1 10*3/uL (ref 4.0–10.5)

## 2015-05-17 LAB — LIPASE, BLOOD: Lipase: 32 U/L (ref 11–51)

## 2015-05-17 LAB — TROPONIN I: Troponin I: <0.03 ng/mL (ref <0.031)

## 2015-05-17 MED ORDER — ASPIRIN 81 MG PO CHEW
324.0000 mg | CHEWABLE_TABLET | Freq: Once | ORAL | Status: AC
  Administered 2015-05-17: 324 mg via ORAL
  Filled 2015-05-17: qty 4

## 2015-05-17 MED ORDER — HYDROCODONE-ACETAMINOPHEN 5-325 MG PO TABS
1.0000 | ORAL_TABLET | Freq: Once | ORAL | Status: DC
  Filled 2015-05-17: qty 1

## 2015-05-17 MED ORDER — OMEPRAZOLE 20 MG PO CPDR: 20.0000 mg | DELAYED_RELEASE_CAPSULE | Freq: Every day | ORAL | Status: DC

## 2015-05-17 MED ORDER — IOHEXOL 350 MG/ML SOLN
100.0000 mL | Freq: Once | INTRAVENOUS | Status: AC | PRN
  Administered 2015-05-17: 100 mL via INTRAVENOUS

## 2015-05-17 MED ORDER — GI COCKTAIL ~~LOC~~
30.0000 mL | Freq: Once | ORAL | Status: AC
  Administered 2015-05-17: 30 mL via ORAL
  Filled 2015-05-17: qty 30

## 2015-05-17 MED ORDER — KETOROLAC TROMETHAMINE 30 MG/ML IJ SOLN
15.0000 mg | Freq: Once | INTRAMUSCULAR | Status: AC
  Administered 2015-05-17: 15 mg via INTRAVENOUS
  Filled 2015-05-17: qty 1

## 2015-05-17 NOTE — Discharge Instructions (Signed)
Nonspecific Chest Pain  There is no evidence of a heart attack.  Take the stomach medication as prescribed and follow up with the stomach doctor for an EGD.  Follow up with Dr. Ladona Ridgelaylor for a stress test and with Dr. Darrick PennaFields regarding the narrowing of the blood vessel in your shoulder. Return to the ED if you develop new or worsening symptoms. Chest pain can be caused by many different conditions. There is always a chance that your pain could be related to something serious, such as a heart attack or a blood clot in your lungs. Chest pain can also be caused by conditions that are not life-threatening. If you have chest pain, it is very important to follow up with your health care provider. CAUSES  Chest pain can be caused by:  Heartburn.  Pneumonia or bronchitis.  Anxiety or stress.  Inflammation around your heart (pericarditis) or lung (pleuritis or pleurisy).  A blood clot in your lung.  A collapsed lung (pneumothorax). It can develop suddenly on its own (spontaneous pneumothorax) or from trauma to the chest.  Shingles infection (varicella-zoster virus).  Heart attack.  Damage to the bones, muscles, and cartilage that make up your chest wall. This can include:  Bruised bones due to injury.  Strained muscles or cartilage due to frequent or repeated coughing or overwork.  Fracture to one or more ribs.  Sore cartilage due to inflammation (costochondritis). RISK FACTORS  Risk factors for chest pain may include:  Activities that increase your risk for trauma or injury to your chest.  Respiratory infections or conditions that cause frequent coughing.  Medical conditions or overeating that can cause heartburn.  Heart disease or family history of heart disease.  Conditions or health behaviors that increase your risk of developing a blood clot.  Having had chicken pox (varicella zoster). SIGNS AND SYMPTOMS Chest pain can feel like:  Burning or tingling on the surface of your  chest or deep in your chest.  Crushing, pressure, aching, or squeezing pain.  Dull or sharp pain that is worse when you move, cough, or take a deep breath.  Pain that is also felt in your back, neck, shoulder, or arm, or pain that spreads to any of these areas. Your chest pain may come and go, or it may stay constant. DIAGNOSIS Lab tests or other studies may be needed to find the cause of your pain. Your health care provider may have you take a test called an ambulatory ECG (electrocardiogram). An ECG records your heartbeat patterns at the time the test is performed. You may also have other tests, such as:  Transthoracic echocardiogram (TTE). During echocardiography, sound waves are used to create a picture of all of the heart structures and to look at how blood flows through your heart.  Transesophageal echocardiogram (TEE).This is a more advanced imaging test that obtains images from inside your body. It allows your health care provider to see your heart in finer detail.  Cardiac monitoring. This allows your health care provider to monitor your heart rate and rhythm in real time.  Holter monitor. This is a portable device that records your heartbeat and can help to diagnose abnormal heartbeats. It allows your health care provider to track your heart activity for several days, if needed.  Stress tests. These can be done through exercise or by taking medicine that makes your heart beat more quickly.  Blood tests.  Imaging tests. TREATMENT  Your treatment depends on what is causing your chest pain. Treatment  may include:  Medicines. These may include:  Acid blockers for heartburn.  Anti-inflammatory medicine.  Pain medicine for inflammatory conditions.  Antibiotic medicine, if an infection is present.  Medicines to dissolve blood clots.  Medicines to treat coronary artery disease.  Supportive care for conditions that do not require medicines. This may  include:  Resting.  Applying heat or cold packs to injured areas.  Limiting activities until pain decreases. HOME CARE INSTRUCTIONS  If you were prescribed an antibiotic medicine, finish it all even if you start to feel better.  Avoid any activities that bring on chest pain.  Do not use any tobacco products, including cigarettes, chewing tobacco, or electronic cigarettes. If you need help quitting, ask your health care provider.  Do not drink alcohol.  Take medicines only as directed by your health care provider.  Keep all follow-up visits as directed by your health care provider. This is important. This includes any further testing if your chest pain does not go away.  If heartburn is the cause for your chest pain, you may be told to keep your head raised (elevated) while sleeping. This reduces the chance that acid will go from your stomach into your esophagus.  Make lifestyle changes as directed by your health care provider. These may include:  Getting regular exercise. Ask your health care provider to suggest some activities that are safe for you.  Eating a heart-healthy diet. A registered dietitian can help you to learn healthy eating options.  Maintaining a healthy weight.  Managing diabetes, if necessary.  Reducing stress. SEEK MEDICAL CARE IF:  Your chest pain does not go away after treatment.  You have a rash with blisters on your chest.  You have a fever. SEEK IMMEDIATE MEDICAL CARE IF:   Your chest pain is worse.  You have an increasing cough, or you cough up blood.  You have severe abdominal pain.  You have severe weakness.  You faint.  You have chills.  You have sudden, unexplained chest discomfort.  You have sudden, unexplained discomfort in your arms, back, neck, or jaw.  You have shortness of breath at any time.  You suddenly start to sweat, or your skin gets clammy.  You feel nauseous or you vomit.  You suddenly feel light-headed or  dizzy.  Your heart begins to beat quickly, or it feels like it is skipping beats. These symptoms may represent a serious problem that is an emergency. Do not wait to see if the symptoms will go away. Get medical help right away. Call your local emergency services (911 in the [place] Do not drive yourself to the hospital.   This information is not intended to replace advice given to you by your health care provider. Make sure you discuss any questions you have with your health care provider.   Document Released: 03/10/2005 Document Revised: 06/21/2014 Document Reviewed: 01/04/2014 Elsevier Interactive Patient Education [employer]

## 2015-05-17 NOTE — ED Notes (Signed)
Pt states that she has been having chest pain on and off for last 3 weeks.  Been getting worse the last couple days

## 2015-05-17 NOTE — ED Provider Notes (Signed)
CSN: [id number]     Arrival date & time 05/17/15  0015 History  By signing my name below, I, Octavia Heir, attest that this documentation has been prepared under the direction and in the presence of Glynn Octave, MD. Electronically Signed: Octavia Heir, ED Scribe. 05/17/2015. 12:47 AM.    Chief Complaint  Patient presents with  . Chest Pain      The history is provided by the patient. No language interpreter was used.   HPI Comments: [name] is a 69 y.o. female who has a hx of chronic neck pain and back pain presents to the Emergency Department complaining of moderate, intermittent, gradual worsening chest pain onset 3 weeks ago. She states she has pain in the center of her chest that lasts about one minute per episode. She is unsure of what brings the chest pain on and states that swallowing medication makes her pain worse. Pt reports being nauseas earlier but reports taking Reglan to alleviate the symptoms with relief. Pt notes she has not had chest pain like this before in the past. Pt states her chest pain increases she she sits up and decreases when she lays down. Pt was tested earlier today for her esophagus. She denies abdominal pain, hx of MI and stents in heart.  Past Medical History  Diagnosis Date  . Hyperlipemia   . Sinusitis   . Back pain   . Fibromyalgia   . Stroke (HCC)   . Anxiety    Past Surgical History  Procedure Laterality Date  . Cholecystectomy    . Abdominal hysterectomy     Family History  Problem Relation Age of Onset  . Depression Mother   . Depression Father   . Depression Sister   . Anxiety disorder Sister   . Alcohol abuse Neg Hx   . Drug abuse Neg Hx   . Anxiety disorder Sister    Social History  Substance Use Topics  . Smoking status: Former Smoker -- 0.00 packs/day for .8 years    Types: Cigarettes  . Smokeless tobacco: Never Used  . Alcohol Use: No   OB History    No data available     Review of Systems  A complete 10  system review of systems was obtained and all systems are negative except as noted in the HPI and PMH.    Allergies  Review of patient's allergies indicates no known allergies.  Home Medications   Prior to Admission medications   Medication Sig Start Date End Date Taking? Authorizing Provider  butalbital-acetaminophen-caffeine (FIORICET, ESGIC) 50-325-40 MG per tablet Take 2 tablets by mouth 2 (two) times daily as needed for headache or migraine.   Yes Historical Provider, MD  cholecalciferol (VITAMIN D) 1000 UNITS tablet Take 1,000 Units by mouth daily.   Yes Historical Provider, MD  clonazePAM (KLONOPIN) 0.5 MG tablet Take 1 tablet (0.5 mg total) by mouth 2 (two) times daily. 05/13/15  Yes Cleotis Nipper, MD  cyclobenzaprine (FLEXERIL) 10 MG tablet Take 10 mg by mouth 3 (three) times daily as needed for muscle spasms.  12/19/12  Yes Historical Provider, MD  DULoxetine (CYMBALTA) 30 MG capsule TAKE 1 CAPSULE BY MOUTH ONCE DAILY.TAKE WITH . 05/13/15  Yes Cleotis Nipper, MD  DULoxetine (CYMBALTA) 60 MG capsule TAKE (1) CAPSULE BY MOUTH EVERY DAY.TAKE WITH . 05/13/15  Yes Cleotis Nipper, MD  gabapentin (NEURONTIN) 300 MG capsule Take 1 capsule (300 mg total) by mouth as needed (anxiety). 05/13/15 05/12/16 Yes Syed  Konrad Saha Arfeen, MD  levothyroxine (SYNTHROID, LEVOTHROID) 25 MCG tablet  04/24/15  Yes Historical Provider, MD  meclizine (ANTIVERT) 25 MG tablet Take 1 tablet (25 mg total) by mouth 4 (four) times daily. 09/13/14  Yes Donnetta HutchingBrian Cook, MD  Oxycodone HCl 10 MG TABS Take 10 mg by mouth every 4 (four) hours as needed (pain).  02/26/13  Yes Historical Provider, MD  simvastatin (ZOCOR) 40 MG tablet Take 40 mg by mouth daily at 6 PM.  04/29/14  Yes Historical Provider, MD  omeprazole (PRILOSEC) 20 MG capsule Take 1 capsule (20 mg total) by mouth daily. 05/17/15   Glynn OctaveStephen Leilynn Pilat, MD   Triage vitals: BP 155/75 mmHg  Pulse 69  Temp(Src) 98.5 F (36.9 C) (Oral)  Resp 9  Ht 5\' 3"  (1.6 m)  Wt 135 lb  (61.236 kg)  BMI 23.92 kg/m2  SpO2 99% Physical Exam  Constitutional: She is oriented to person, place, and time. She appears well-developed and well-nourished. No distress.  HENT:  Head: Normocephalic and atraumatic.  Mouth/Throat: Oropharynx is clear and moist. No oropharyngeal exudate.  Eyes: Conjunctivae and EOM are normal. Pupils are equal, round, and reactive to light.  Neck: Normal range of motion. Neck supple.  No meningismus.  Cardiovascular: Normal rate, regular rhythm, normal heart sounds and intact distal pulses.   No murmur heard. Equal radial pulses and grip strengths  Pulmonary/Chest: Effort normal and breath sounds normal. No respiratory distress. She exhibits no tenderness.  Abdominal: Soft. There is no tenderness. There is no rebound and no guarding.  Musculoskeletal: Normal range of motion. She exhibits no edema or tenderness.  Neurological: She is alert and oriented to person, place, and time. No cranial nerve deficit. She exhibits normal muscle tone. Coordination normal.  No ataxia on finger to nose bilaterally. No pronator drift. 5/5 strength throughout. CN 2-12 intact.Equal grip strength. Sensation intact. Equal radial pulses  Skin: Skin is warm.  Psychiatric: She has a normal mood and affect. Her behavior is normal.  Nursing note and vitals reviewed.   ED Course  Procedures  DIAGNOSTIC STUDIES: Oxygen Saturation is 99% on RA, normal by my interpretation.  COORDINATION OF CARE:  12:45 AM Discussed treatment plan which includes CXR, lab work with pt at bedside and pt agreed to plan.  Labs Review Labs Reviewed  BASIC METABOLIC PANEL - Abnormal; Notable for the following:    Glucose, Bld 129 (*)    All other components within normal limits  CBC - Abnormal; Notable for the following:    RBC 3.81 (*)    All other components within normal limits  HEPATIC FUNCTION PANEL - Abnormal; Notable for the following:    ALT 13 (*)    Indirect Bilirubin 0.2 (*)    All  other components within normal limits  D-DIMER, QUANTITATIVE (NOT AT [facility]) - Abnormal; Notable for the following:    D-Dimer, Quant 0.76 (*)    All other components within normal limits  LIPASE, BLOOD  TROPONIN I  TROPONIN I    Imaging Review Dg Chest 2 View  05/17/2015  CLINICAL DATA:  Intermittent chest pain and dyspnea for 3 weeks EXAM: CHEST  2 VIEW COMPARISON:  03/24/2012 FINDINGS: The heart size and mediastinal contours are within normal limits. Both lungs are clear. The visualized skeletal structures are unremarkable. IMPRESSION: No active cardiopulmonary disease. Electronically Signed   By: Ellery Plunkaniel R Mitchell M.D.   On: 05/17/2015 01:40   Ct Angio Chest Pe W/cm &/or Wo Cm  05/17/2015  CLINICAL DATA:  Dyspnea and constant chest pain intermittent for the past 3 weeks but worsened over the past 2 days. EXAM: CT ANGIOGRAPHY CHEST WITH CONTRAST TECHNIQUE: Multidetector CT imaging of the chest was performed using the standard protocol during bolus administration of intravenous contrast. Multiplanar CT image reconstructions and MIPs were obtained to evaluate the vascular anatomy. CONTRAST:  OMNIPAQUE IOHEXOL 350 MG/ML SOLN COMPARISON:  03/24/2012 FINDINGS: Cardiovascular: There is good opacification of the pulmonary arteries. There is no pulmonary embolism. The thoracic aorta is normal in caliber and intact. There is dense calcification of the origin of the right subclavian artery and probable stenosis or short focal occlusion, seen to best advantage on sagittal images. Lungs: Clear Central airways: Patent Effusions: None Lymphadenopathy: None Esophagus: Mural thickening throughout much of the length of the esophagus, more likely inflammatory but neoplasm cannot be entirely excluded. Upper abdomen: No significant abnormality Musculoskeletal: No significant abnormality Review of the MIP images confirms the above findings. IMPRESSION: 1. Negative for acute pulmonary embolism. 2. Stenosis or short  focal occlusion at the origin of the right subclavian artery. 3. Mural thickening throughout most of the thoracic esophagus. This is more likely inflammatory but neoplasm is not entirely excluded. Electronically Signed   By: Ellery Plunk M.D.   On: 05/17/2015 06:13   Dg Esophagus  05/16/2015  CLINICAL DATA:  Dysphagia, feels like things stick and throat, pills cut stuck sometimes EXAM: ESOPHOGRAM / BARIUM SWALLOW / BARIUM TABLET STUDY TECHNIQUE: Combined double contrast and single contrast examination performed using effervescent crystals, thick barium liquid, and thin barium liquid. The patient was observed with fluoroscopy swallowing a 13 mm barium sulphate tablet. FLUOROSCOPY TIME:  Radiation Exposure Index (as provided by the fluoroscopic device): Not provided If the device does not provide the exposure index: Fluoroscopy Time:  1 minutes 54 seconds Number of Acquired Images: 21 plus multiple screen captures during fluoroscopy COMPARISON:  None FINDINGS: Normal esophageal distention. No esophageal mass or stricture. 12.5 mm diameter barium tablet passes from oral cavity to stomach without delay. Will age-appropriate impairment of esophageal motility with incomplete clearance of barium by primary peristaltic waves. Clearance of contrast from the esophagus is aided by upright positioning and repeat swallows. Smooth esophageal walls without irregularity or ulceration. No definite persistent intraluminal filling defects or hiatal hernia seen during exam. Targeted rapid sequence imaging of cervical esophagus and hypopharynx showed no laryngeal penetration or aspiration. Bones appear demineralized. BILATERAL breast prostheses with capsular calcification. IMPRESSION: Age appropriate esophageal dysmotility. Otherwise negative exam. Electronically Signed   By: Ulyses Southward M.D.   On: 05/16/2015 13:54   I have personally reviewed and evaluated these images and lab results as part of my medical  decision-making.   EKG Interpretation   Date/Time:  Saturday May 17 2015 03:11:48 EST Ventricular Rate:  65 PR Interval:  178 QRS Duration: 81 QT Interval:  422 QTC Calculation: 439 R Axis:   47 Text Interpretation:  Sinus rhythm Abnormal R-wave progression, early  transition Nonspecific T abnormalities, anterior leads No significant  change was found Confirmed by Manus Gunning  MD, Syan Cullimore ([id number]) on 05/17/2015  3:32:26 AM      MDM   Final diagnoses:  Atypical chest pain   Intermittent chest pain for the past 3 weeks that comes and goes lasting a few minutes at a time. Had esophagram by her PCP that was negative. EKG with nonspecific ST changes and septal T wave inversions. Pain worse with sitting up  Troponin negative 2. Chest x-ray negative. No improvement  with GI cocktail.  CT shows thickening of her esophagus. We'll start PPI. Recommend return to gastroenterology for repeat endoscopy. Avoid alcohol, NSAIDs, caffeine, spicy foods.  CT incidentally shows right subclavian stenosis which appears to be at least stable since 2013. She has a strong radial pulse, no carotid bruit and no evidence of claudication. Patient was aware of this finding. Referral to vascular surgery will be given. Would also benefit from repeat stress testing as outpatient.  Return precautions discussed.  I personally performed the services described in this documentation, which was scribed in my presence. The recorded information has been reviewed and is accurate.   Glynn Octave, MD 05/17/15 [phone]

## 2015-05-19 LAB — I-STAT TROPONIN, ED: TROPONIN I, POC: 0 ng/mL (ref 0.00–0.08)

## 2015-05-21 ENCOUNTER — Encounter (INDEPENDENT_AMBULATORY_CARE_PROVIDER_SITE_OTHER): Payer: Self-pay | Admitting: *Deleted

## 2015-06-14 ENCOUNTER — Other Ambulatory Visit (HOSPITAL_COMMUNITY): Payer: Self-pay | Admitting: Psychiatry

## 2015-06-14 DIAGNOSIS — F411 Generalized anxiety disorder: Secondary | ICD-10-CM

## 2015-06-18 ENCOUNTER — Other Ambulatory Visit (HOSPITAL_COMMUNITY): Payer: Self-pay | Admitting: Psychiatry

## 2015-06-18 MED ORDER — CLONAZEPAM 0.5 MG PO TABS: 0.5000 mg | ORAL_TABLET | Freq: Two times a day (BID) | ORAL | Status: DC

## 2015-06-18 NOTE — Telephone Encounter (Signed)
Met with Dr. Adele Schilder who approved a new order for patient's prescribed Clonazepam 0.5 mg, one twice a day, #60 with one refill.  Called in order as instructed with April, pharmacist at [facility] in [place], [place].

## 2015-06-25 ENCOUNTER — Ambulatory Visit (INDEPENDENT_AMBULATORY_CARE_PROVIDER_SITE_OTHER): Payer: PPO | Admitting: Internal Medicine

## 2015-06-25 ENCOUNTER — Other Ambulatory Visit (INDEPENDENT_AMBULATORY_CARE_PROVIDER_SITE_OTHER): Payer: Self-pay | Admitting: Internal Medicine

## 2015-06-25 ENCOUNTER — Encounter (INDEPENDENT_AMBULATORY_CARE_PROVIDER_SITE_OTHER): Payer: Self-pay | Admitting: Internal Medicine

## 2015-06-25 ENCOUNTER — Encounter (HOSPITAL_COMMUNITY): Payer: Self-pay

## 2015-06-25 ENCOUNTER — Encounter (INDEPENDENT_AMBULATORY_CARE_PROVIDER_SITE_OTHER): Payer: Self-pay | Admitting: *Deleted

## 2015-06-25 ENCOUNTER — Encounter (HOSPITAL_COMMUNITY)
Admission: RE | Admit: 2015-06-25 | Discharge: 2015-06-25 | Disposition: A | Discharge status: Home / Self Care | Payer: PPO | Source: Ambulatory Visit | Attending: Internal Medicine | Admitting: Internal Medicine

## 2015-06-25 VITALS — BP 112/65 | HR 84 | Temp 99.0°F | Ht 63.0 in | Wt 139.7 lb

## 2015-06-25 DIAGNOSIS — R935 Abnormal findings on diagnostic imaging of other abdominal regions, including retroperitoneum: Secondary | ICD-10-CM

## 2015-06-25 DIAGNOSIS — R9389 Abnormal findings on diagnostic imaging of other specified body structures: Secondary | ICD-10-CM

## 2015-06-25 DIAGNOSIS — R1310 Dysphagia, unspecified: Secondary | ICD-10-CM

## 2015-06-25 DIAGNOSIS — R938 Abnormal findings on diagnostic imaging of other specified body structures: Secondary | ICD-10-CM

## 2015-06-25 DIAGNOSIS — R1319 Other dysphagia: Secondary | ICD-10-CM | POA: Diagnosis not present

## 2015-06-25 NOTE — Progress Notes (Signed)
Subjective:    Patient ID: [name], female    DOB: 04/07/1946, 70 y.o.   MRN: [id number]  HPIReferred by Dr. [name] for dysphagia. She tells me she has had dysphagia for years. She is not avoiding any foods. She says her medications are lodging in her esophagus. No foods really bother her. She does have acid reflux occasionally but the Omeprazole helps.  She says she has a choking sensation. Appetite is good. There has been no weight loss.  There is no abdominal pain. She usually has a BM every other day.  No melena or BRRB.  Hx significant for a previous CVA x 3 and is maintained on ASA 325mg  She has had EGD/ED x 3 years ago.  She takes Oxycodone x 6 a day for fibromyalgia and back.   05/16/2015 DG Esophagram: dysphagia. Normal esophageal distention. No esophageal mass or stricture. 12.5 mm diameter barium tablet passes from oral cavity to stomach without delay. Age appropriate esophageal dysmotility.   05/17/2015 CT angio chest: dyspnea: chest pain IMPRESSION: 1. Negative for acute pulmonary embolism. 2. Stenosis or short focal occlusion at the origin of the right subclavian artery. 3. Mural thickening throughout most of the thoracic esophagus. This is more likely inflammatory but neoplasm is not entirely excluded.    Review of Systems Past Medical History  Diagnosis Date  . Hyperlipemia   . Sinusitis   . Back pain   . Fibromyalgia   . Stroke (HCC)   . Anxiety     Past Surgical History  Procedure Laterality Date  . Abdominal hysterectomy    . Cholecystectomy      Allergies  Allergen Reactions  . Codeine     nausea    Current Outpatient Prescriptions on File Prior to Visit  Medication Sig Dispense Refill  . butalbital-acetaminophen-caffeine (FIORICET, ESGIC) 50-325-40 MG per tablet Take 2 tablets by mouth 2 (two) times daily as needed for headache or migraine.    . clonazePAM (KLONOPIN) 0.5 MG tablet Take 1 tablet (0.5 mg total) by mouth 2 (two) times  daily. 60 tablet 1  . cyclobenzaprine (FLEXERIL) 10 MG tablet Take 10 mg by mouth 3 (three) times daily as needed for muscle spasms.     . DULoxetine (CYMBALTA) 30 MG capsule TAKE 1 CAPSULE BY MOUTH ONCE DAILY.TAKE WITH 60MG . 30 capsule 2  . DULoxetine (CYMBALTA) 60 MG capsule TAKE (1) CAPSULE BY MOUTH EVERY DAY.TAKE WITH 30MG . 30 capsule 2  . gabapentin (NEURONTIN) 300 MG capsule Take 1 capsule (300 mg total) by mouth as needed (anxiety). 20 capsule 1  . levothyroxine (SYNTHROID, LEVOTHROID) 25 MCG tablet   1  . omeprazole (PRILOSEC) 20 MG capsule Take 1 capsule (20 mg total) by mouth daily. 30 capsule 0  . Oxycodone HCl 10 MG TABS Take 10 mg by mouth every 4 (four) hours as needed (pain).     . simvastatin (ZOCOR) 40 MG tablet Take 40 mg by mouth daily at 6 PM.   4   No current facility-administered medications on file prior to visit.        Objective:   Physical ExamBlood pressure 112/65, pulse 84, temperature 99 F (37.2 C), height 5\' 3"  (1.6 m), weight 139 lb 11.2 oz (63.368 kg). Alert and oriented. Skin warm and dry. Oral mucosa is moist.   . Sclera anicteric, conjunctivae is pink. Thyroid not enlarged. No cervical lymphadenopathy. Lungs clear. Heart regular rate and rhythm.  Abdomen is soft. Bowel sounds are positive. No hepatomegaly. No  abdominal masses felt. No tenderness.  No edema to lower extremities.          Assessment & Plan:  Dysphagia. Stricture needs to be ruled out. She also had an abnormal CT. EGD/ED.

## 2015-06-25 NOTE — Patient Instructions (Signed)
EGD/ED. The risks and benefits such as perforation, bleeding, and infection were reviewed with the patient and is agreeable. 

## 2015-06-26 ENCOUNTER — Inpatient Hospital Stay (HOSPITAL_COMMUNITY)
Admission: RE | Admit: 2015-06-26 | Discharge: 2015-06-26 | Disposition: A | Discharge status: Home / Self Care | Payer: Self-pay | Source: Ambulatory Visit

## 2015-06-26 HISTORY — DX: Hypothyroidism, unspecified: E03.9

## 2015-06-26 HISTORY — DX: Polyneuropathy, unspecified: G62.9

## 2015-06-27 ENCOUNTER — Encounter (HOSPITAL_COMMUNITY): Payer: Self-pay | Admitting: *Deleted

## 2015-06-27 ENCOUNTER — Ambulatory Visit (HOSPITAL_COMMUNITY): Payer: PPO | Admitting: Anesthesiology

## 2015-06-27 ENCOUNTER — Ambulatory Visit (HOSPITAL_COMMUNITY)
Admission: RE | Admit: 2015-06-27 | Discharge: 2015-06-27 | Disposition: A | Discharge status: Home / Self Care | Payer: PPO | Source: Ambulatory Visit | Attending: Internal Medicine | Admitting: Internal Medicine

## 2015-06-27 ENCOUNTER — Encounter (HOSPITAL_COMMUNITY)
Admission: RE | Disposition: A | Discharge status: Home / Self Care | Payer: Self-pay | Source: Ambulatory Visit | Attending: Internal Medicine

## 2015-06-27 DIAGNOSIS — K319 Disease of stomach and duodenum, unspecified: Secondary | ICD-10-CM | POA: Insufficient documentation

## 2015-06-27 DIAGNOSIS — Z7982 Long term (current) use of aspirin: Secondary | ICD-10-CM | POA: Diagnosis not present

## 2015-06-27 DIAGNOSIS — Z8673 Personal history of transient ischemic attack (TIA), and cerebral infarction without residual deficits: Secondary | ICD-10-CM | POA: Diagnosis not present

## 2015-06-27 DIAGNOSIS — M797 Fibromyalgia: Secondary | ICD-10-CM | POA: Diagnosis not present

## 2015-06-27 DIAGNOSIS — R933 Abnormal findings on diagnostic imaging of other parts of digestive tract: Secondary | ICD-10-CM | POA: Diagnosis not present

## 2015-06-27 DIAGNOSIS — K259 Gastric ulcer, unspecified as acute or chronic, without hemorrhage or perforation: Secondary | ICD-10-CM | POA: Diagnosis not present

## 2015-06-27 DIAGNOSIS — R1310 Dysphagia, unspecified: Secondary | ICD-10-CM | POA: Diagnosis not present

## 2015-06-27 DIAGNOSIS — K3189 Other diseases of stomach and duodenum: Secondary | ICD-10-CM | POA: Diagnosis not present

## 2015-06-27 DIAGNOSIS — E785 Hyperlipidemia, unspecified: Secondary | ICD-10-CM | POA: Insufficient documentation

## 2015-06-27 DIAGNOSIS — Z79899 Other long term (current) drug therapy: Secondary | ICD-10-CM | POA: Insufficient documentation

## 2015-06-27 DIAGNOSIS — F419 Anxiety disorder, unspecified: Secondary | ICD-10-CM | POA: Diagnosis not present

## 2015-06-27 DIAGNOSIS — R935 Abnormal findings on diagnostic imaging of other abdominal regions, including retroperitoneum: Secondary | ICD-10-CM

## 2015-06-27 DIAGNOSIS — F17210 Nicotine dependence, cigarettes, uncomplicated: Secondary | ICD-10-CM | POA: Diagnosis not present

## 2015-06-27 DIAGNOSIS — K449 Diaphragmatic hernia without obstruction or gangrene: Secondary | ICD-10-CM | POA: Insufficient documentation

## 2015-06-27 DIAGNOSIS — E039 Hypothyroidism, unspecified: Secondary | ICD-10-CM | POA: Insufficient documentation

## 2015-06-27 DIAGNOSIS — Q394 Esophageal web: Secondary | ICD-10-CM | POA: Diagnosis not present

## 2015-06-27 HISTORY — PX: ESOPHAGEAL DILATION: SHX303

## 2015-06-27 HISTORY — PX: ESOPHAGOGASTRODUODENOSCOPY (EGD) WITH PROPOFOL: SHX5813

## 2015-06-27 SURGERY — ESOPHAGOGASTRODUODENOSCOPY (EGD) WITH PROPOFOL
Anesthesia: Monitor Anesthesia Care

## 2015-06-27 MED ORDER — MIDAZOLAM HCL 2 MG/2ML IJ SOLN
INTRAMUSCULAR | Status: AC
  Filled 2015-06-27: qty 2

## 2015-06-27 MED ORDER — ONDANSETRON HCL 4 MG/2ML IJ SOLN
4.0000 mg | Freq: Once | INTRAMUSCULAR | Status: AC | PRN
  Administered 2015-06-27: 4 mg via INTRAVENOUS

## 2015-06-27 MED ORDER — MIDAZOLAM HCL 5 MG/5ML IJ SOLN
INTRAMUSCULAR | Status: DC | PRN
Start: 2015-06-27 — End: 2015-06-27
  Administered 2015-06-27: 2 mg via INTRAVENOUS

## 2015-06-27 MED ORDER — BUTAMBEN-TETRACAINE-BENZOCAINE 2-2-14 % EX AERO
2.0000 | INHALATION_SPRAY | Freq: Once | CUTANEOUS | Status: AC
  Administered 2015-06-27: 2 via TOPICAL

## 2015-06-27 MED ORDER — FENTANYL CITRATE (PF) 100 MCG/2ML IJ SOLN: 25.0000 ug | INTRAMUSCULAR | Status: DC | PRN

## 2015-06-27 MED ORDER — PROPOFOL 500 MG/50ML IV EMUL
INTRAVENOUS | Status: DC | PRN
  Administered 2015-06-27: 125 ug/kg/min via INTRAVENOUS

## 2015-06-27 MED ORDER — PROPOFOL 10 MG/ML IV BOLUS
INTRAVENOUS | Status: AC
  Filled 2015-06-27: qty 40

## 2015-06-27 MED ORDER — MIDAZOLAM HCL 2 MG/2ML IJ SOLN
1.0000 mg | INTRAMUSCULAR | Status: DC | PRN
  Administered 2015-06-27 (×3): 2 mg via INTRAVENOUS
  Filled 2015-06-27 (×6): qty 2

## 2015-06-27 MED ORDER — ONDANSETRON HCL 4 MG/2ML IJ SOLN
INTRAMUSCULAR | Status: AC
  Filled 2015-06-27: qty 2

## 2015-06-27 MED ORDER — LACTATED RINGERS IV SOLN
INTRAVENOUS | Status: DC
  Administered 2015-06-27: 11:00:00 via INTRAVENOUS

## 2015-06-27 NOTE — Transfer of Care (Signed)
Immediate Anesthesia Transfer of Care Note  Patient: [name]  Procedure(s) Performed: Procedure(s): ESOPHAGOGASTRODUODENOSCOPY (EGD) WITH PROPOFOL (N/A) ESOPHAGEAL DILATION (N/A)  Patient Location: PACU  Anesthesia Type:MAC  Level of Consciousness: awake, alert  and patient cooperative  Airway & Oxygen Therapy: Patient Spontanous Breathing and Patient connected to face mask oxygen  Post-op Assessment: Report given to RN, Post -op Vital signs reviewed and stable and Patient moving all extremities  Post vital signs: Reviewed and stable  Last Vitals:  Filed Vitals:   06/27/15 1130 06/27/15 1135  BP: 144/75 137/66  Temp:    Resp: 14 17    Complications: No apparent anesthesia complications

## 2015-06-27 NOTE — Anesthesia Postprocedure Evaluation (Signed)
Anesthesia Post Note  Patient: [name]  Procedure(s) Performed: Procedure(s) (LRB): ESOPHAGOGASTRODUODENOSCOPY (EGD) WITH PROPOFOL (N/A) ESOPHAGEAL DILATION (N/A)  Patient location during evaluation: PACU Anesthesia Type: General Level of consciousness: awake and alert and patient cooperative Pain management: pain level controlled Vital Signs Assessment: post-procedure vital signs reviewed and stable Respiratory status: spontaneous breathing, nonlabored ventilation and patient connected to face mask oxygen Cardiovascular status: blood pressure returned to baseline Postop Assessment: no signs of nausea or vomiting Anesthetic complications: no    Last Vitals:  Filed Vitals:   06/27/15 1130 06/27/15 1135  BP: 144/75 137/66  Temp:    Resp: 14 17    Last Pain:  Filed Vitals:   06/27/15 1250  PainSc: 10-Worst pain ever                 [name]

## 2015-06-27 NOTE — Discharge Instructions (Signed)
Resume aspirin on 06/29/2015 and other medications as before. Resume usual diet. No driving for 24 hours. Physician will call with biopsy results.       Esophagogastroduodenoscopy, Care After Refer to this sheet in the next few weeks. These instructions provide you with information about caring for yourself after your procedure. Your health care provider may also give you more specific instructions. Your treatment has been planned according to current medical practices, but problems sometimes occur. Call your health care provider if you have any problems or questions after your procedure. WHAT TO EXPECT AFTER THE PROCEDURE After your procedure, it is typical to feel:  Soreness in your throat.  Pain with swallowing.  Sick to your stomach (nauseous).  Bloated.  Dizzy.  Fatigued. HOME CARE INSTRUCTIONS  Do not eat or drink anything until the numbing medicine (local anesthetic) has worn off and your gag reflex has returned. You will know that the local anesthetic has worn off when you can swallow comfortably.  Do not drive or operate machinery until directed by your health care provider.  Take medicines only as directed by your health care provider. SEEK MEDICAL CARE IF:   You cannot stop coughing.  You are not urinating at all or less than usual. SEEK IMMEDIATE MEDICAL CARE IF:  You have difficulty swallowing.  You cannot eat or drink.  You have worsening throat or chest pain.  You have dizziness or lightheadedness or you faint.  You have nausea or vomiting.  You have chills.  You have a fever.  You have severe abdominal pain.  You have black, tarry, or bloody stools.   This information is not intended to replace advice given to you by your health care provider. Make sure you discuss any questions you have with your health care provider.   Document Released: 05/17/2012 Document Revised: 06/21/2014 Document Reviewed: 05/17/2012 Elsevier Interactive Patient  Education 2016 [employer]   Hiatal Hernia A hiatal hernia occurs when part of your stomach slides above the muscle that separates your abdomen from your chest (diaphragm). You can be born with a hiatal hernia (congenital), or it may develop over time. In almost all cases of hiatal hernia, only the top part of the stomach pushes through.  Many people have a hiatal hernia with no symptoms. The larger the hernia, the more likely that you will have symptoms. In some cases, a hiatal hernia allows stomach acid to flow back into the tube that carries food from your mouth to your stomach (esophagus). This may cause heartburn symptoms. Severe heartburn symptoms may mean you have developed a condition called gastroesophageal reflux disease (GERD).  CAUSES  Hiatal hernias are caused by a weakness in the opening (hiatus) where your esophagus passes through your diaphragm to attach to the upper part of your stomach. You may be born with a weakness in your hiatus, or a weakness can develop. RISK FACTORS Older age is a major risk factor for a hiatal hernia. Anything that increases pressure on your diaphragm can also increase your risk of a hiatal hernia. This includes:  Pregnancy.  Excess weight.  Frequent constipation. SIGNS AND SYMPTOMS  People with a hiatal hernia often have no symptoms. If symptoms develop, they are almost always caused by GERD. They may include:  Heartburn.  Belching.  Indigestion.  Trouble swallowing.  Coughing or wheezing.  Sore throat.  Hoarseness.  Chest pain. DIAGNOSIS  A hiatal hernia is sometimes found during an exam for another problem. Your health care provider may  suspect a hiatal hernia if you have symptoms of GERD. Tests may be done to diagnose GERD. These may include:  X-rays of your stomach or chest.  An upper gastrointestinal (GI) series. This is an X-ray exam of your GI tract involving the use of a chalky liquid that you swallow. The liquid shows up  clearly on the X-ray.  Endoscopy. This is a procedure to look into your stomach using a thin, flexible tube that has a tiny camera and light on the end of it. TREATMENT  If you have no symptoms, you may not need treatment. If you have symptoms, treatment may include:  Dietary and lifestyle changes to help reduce GERD symptoms.  Medicines. These may include:  Over-the-counter antacids.  Medicines that make your stomach empty more quickly.  Medicines that block the production of stomach acid (H2 blockers).  Stronger medicines to reduce stomach acid (proton pump inhibitors).  You may need surgery to repair the hernia if other treatments are not helping. HOME CARE INSTRUCTIONS   Take all medicines as directed by your health care provider.  Quit smoking, if you smoke.  Try to achieve and maintain a healthy body weight.  Eat frequent small meals instead of three large meals a day. This keeps your stomach from getting too full.  Eat slowly.  Do not lie down right after eating.  Do noteat 1-2 hours before bed.   Do not drink beverages with caffeine. These include cola, coffee, cocoa, and tea.  Do not drink alcohol.  Avoid foods that can make symptoms of GERD worse. These may include:  Fatty foods.  Citrus fruits.  Other foods and drinks that contain acid.  Avoid putting pressure on your belly. Anything that puts pressure on your belly increases the amount of acid that may be pushed up into your esophagus.   Avoid bending over, especially after eating.  Raise the head of your bed by putting blocks under the legs. This keeps your head and esophagus higher than your stomach.  Do not wear tight clothing around your chest or stomach.  Try not to strain when having a bowel movement, when urinating, or when lifting heavy objects. SEEK MEDICAL CARE IF:  Your symptoms are not controlled with medicines or lifestyle changes.  You are having trouble swallowing.  You have  coughing or wheezing that will not go away. SEEK IMMEDIATE MEDICAL CARE IF:  Your pain is getting worse.  Your pain spreads to your arms, neck, jaw, teeth, or back.  You have shortness of breath.  You sweat for no reason.  You feel sick to your stomach (nauseous) or vomit.  You vomit blood.  You have bright red blood in your stools.  You have black, tarry stools.    This information is not intended to replace advice given to you by your health care provider. Make sure you discuss any questions you have with your health care provider.   Document Released: 08/21/2003 Document Revised: 06/21/2014 Document Reviewed: 05/18/2013 Elsevier Interactive Patient Education 2016 [employer] Inc[employer]     Peptic Ulcer A peptic ulcer is a painful sore in the lining of your esophagus, stomach, or in the first part of your small intestine. The main causes of an ulcer can be:  An infection.  Using certain pain medicines too often or too much.  Smoking. HOME CARE  Avoid smoking, alcohol, and caffeine.  Avoid foods that bother you.  Only take medicine as told by your doctor. Do not take any medicines  your doctor has not approved.  Keep all doctor visits as told. GET HELP IF:  You do not get better in 7 days after starting treatment.  You keep having an upset stomach (indigestion) or heartburn. GET HELP RIGHT AWAY IF:  You have sudden, sharp, or lasting belly (abdominal) pain.  You have bloody, black, or tarry poop (stool).  You throw up (vomit) blood or your throw up looks like coffee grounds.  You get light-headed, weak, or feel like you will pass out (faint).  You get sweaty or feel sticky and cold to the touch (clammy). MAKE SURE YOU:   Understand these instructions.  Will watch your condition.  Will get help right away if you are not doing well or get worse.   This information is not intended to replace advice given to you by your health care provider. Make sure you  discuss any questions you have with your health care provider.   Document Released: 08/25/2009 Document Revised: 06/21/2014 Document Reviewed: 12/29/2011 Elsevier Interactive Patient Education [employer]

## 2015-06-27 NOTE — Anesthesia Preprocedure Evaluation (Signed)
Anesthesia Evaluation  Patient identified by MRN, date of birth, ID band Patient awake    Reviewed: Allergy & Precautions, NPO status , Patient's Chart, lab work & pertinent test results  Airway Mallampati: III  TM Distance: >3 FB     Dental  (+) Missing, Caps, Dental Advisory Given,    Pulmonary Current Smoker,    Pulmonary exam normal        Cardiovascular Normal cardiovascular exam     Neuro/Psych Anxiety  Neuromuscular disease CVA    GI/Hepatic   Endo/Other  Hypothyroidism   Renal/GU      Musculoskeletal  (+) Fibromyalgia -  Abdominal Normal abdominal exam  (+)   Peds  Hematology   Anesthesia Other Findings   Reproductive/Obstetrics                             Anesthesia Physical Anesthesia Plan  ASA: III  Anesthesia Plan: MAC   Post-op Pain Management:    Induction: Intravenous  Airway Management Planned: Nasal Cannula  Additional Equipment:   Intra-op Plan:   Post-operative Plan:   Informed Consent: I have reviewed the patients History and Physical, chart, labs and discussed the procedure including the risks, benefits and alternatives for the proposed anesthesia with the patient or authorized representative who has indicated his/her understanding and acceptance.   Dental advisory given  Plan Discussed with:   Anesthesia Plan Comments:         Anesthesia Quick Evaluation

## 2015-06-27 NOTE — H&P (Addendum)
[name] is an 70 y.o. female.   Chief Complaint: Patient is here for EGD and ED. HPI: Patient is 70 year old Caucasian female who presents with several year history of dysphagia primarily to pills. Occasionally she's had difficulty with foods. She points to suprasternal area side of bolus or pill obstruction.  She denies abdominal pain melena anorexia weight loss. Her husband states she is actually gained 15 pounds over the last several months. She rarely experiences heartburn. She has been on a number omeprazole for about 4 weeks following her visit to emergency room. Her husband states that she is having less swallowing difficulty. She had barium study on 05/16/2015 and was within normal limits. Chest CT angio was performed on 05/17/2015 when she was seen in emergency room. It showed esophageal wall thickening  Past Medical History  Diagnosis Date  . Hyperlipemia   . Sinusitis   . Back pain   . Fibromyalgia   . Anxiety   . Stroke [facility](HCC)     no deficits  . Hypothyroidism   . Neuropathy [facility](HCC)     Past Surgical History  Procedure Laterality Date  . Abdominal hysterectomy    . Cholecystectomy    . Cataract extraction Bilateral     Family History  Problem Relation Age of Onset  . Depression Mother   . Depression Father   . Depression Sister   . Anxiety disorder Sister   . Alcohol abuse Neg Hx   . Drug abuse Neg Hx   . Anxiety disorder Sister    Social History:  reports that she has been smoking Cigarettes.  She has a 12.5 pack-year smoking history. She has never used smokeless tobacco. She reports that she does not drink alcohol or use illicit drugs.  Allergies:  Allergies  Allergen Reactions  . Codeine     nausea    Medications Prior to Admission  Medication Sig Dispense Refill  . aspirin 325 MG tablet Take 325 mg by mouth daily.    . butalbital-acetaminophen-caffeine (FIORICET, ESGIC) 50-325-40 MG per tablet Take 2 tablets by mouth 2 (two) times daily as needed  for headache or migraine.    . clonazePAM (KLONOPIN) 0.5 MG tablet Take 1 tablet (0.5 mg total) by mouth 2 (two) times daily. 60 tablet 1  . cyclobenzaprine (FLEXERIL) 10 MG tablet Take 10 mg by mouth 3 (three) times daily as needed for muscle spasms.     . DULoxetine (CYMBALTA) 30 MG capsule TAKE 1 CAPSULE BY MOUTH ONCE DAILY.TAKE WITH 60MG . 30 capsule 2  . DULoxetine (CYMBALTA) 60 MG capsule TAKE (1) CAPSULE BY MOUTH EVERY DAY.TAKE WITH 30MG . 30 capsule 2  . gabapentin (NEURONTIN) 300 MG capsule Take 1 capsule (300 mg total) by mouth as needed (anxiety). 20 capsule 1  . levocetirizine (XYZAL) 5 MG tablet Take 5 mg by mouth every evening.    Marland Kitchen. levothyroxine (SYNTHROID, LEVOTHROID) 25 MCG tablet Take 25 mcg by mouth daily before breakfast.   1  . omeprazole (PRILOSEC) 20 MG capsule Take 1 capsule (20 mg total) by mouth daily. 30 capsule 0  . Oxycodone HCl 10 MG TABS Take 10 mg by mouth every 4 (four) hours as needed (pain).     . simvastatin (ZOCOR) 40 MG tablet Take 40 mg by mouth daily at 6 PM.   4  . valACYclovir (VALTREX) 1000 MG tablet Take 1,000 mg by mouth daily as needed (for fever blisters).      No results found for this or any previous  visit (from the past 48 hour(s)). No results found.  ROS  Blood pressure 137/66, temperature 98.7 F (37.1 C), temperature source Oral, resp. rate 17, SpO2 100 %. Physical Exam  Constitutional: She appears well-developed and well-nourished.  HENT:  Mouth/Throat: Oropharynx is clear and moist.  Number of teeth missing and upper jaw remaining teeth in satisfactory condition  Eyes: Conjunctivae are normal. No scleral icterus.  Neck: No thyromegaly present.  Cardiovascular: Normal rate, regular rhythm and normal heart sounds.   No murmur heard. Respiratory: Effort normal and breath sounds normal.  GI: Soft. She exhibits no distension and no mass. There is no tenderness.  Musculoskeletal: She exhibits no edema.  Lymphadenopathy:    She has no  cervical adenopathy.  Neurological: She is alert.  Skin: Skin is warm and dry.     Assessment/Plan Chronic dysphagia primarily to pills and occasionally to food. Esophageal wall thickening on chest CT. EGD, possible ED and biopsy.  Reginaldo Hazard U 06/27/2015, 11:41 AM

## 2015-06-27 NOTE — Op Note (Signed)
EGD PROCEDURE REPORT  PATIENT:  [name]  MR#:  [id number] Birthdate:  [birth year], 70 y.o., female Endoscopist:  Dr. Malissa HippoNajeeb U. Finleigh Cheong, MD Referred By:  Dr. Catalina PizzaZach Hall, MD  Procedure Date: 06/27/2015  Procedure:   EGD with ED  Indications:  Patient is 70 year old Caucasian female who presents with several year history of dysphagia primarily to pills she has occasional difficulty with foods. She was recently seen in emergency room for chest pain and chest CT revealed esophageal wall thickening. However barium study one day prior to chest CT was unremarkable.           Informed Consent:  The risks, benefits, alternatives & imponderables which include, but are not limited to, bleeding, infection, perforation, drug reaction and potential missed lesion have been reviewed.  The potential for biopsy, lesion removal, esophageal dilation, etc. have also been discussed.  Questions have been answered.  All parties agreeable.  Please see history & physical in medical record for more information.  Medications:  Cetacaine spray topically for oropharyngeal anesthesia Monitored anesthesia care. Please see anesthesia records for details.  Description of procedure:  The endoscope was introduced through the mouth and advanced to the second portion of the duodenum without difficulty or limitations. The mucosal surfaces were surveyed very carefully during advancement of the scope and upon withdrawal.  Findings:  Esophagus: Two small patches of gastric ectopic mucosa noted below UES. There was coarse appearance to esophageal mucosa with linear furrows and diffuse circumferential rings but no discrete stricture noted. No ring or stricture noted at GE junction. GEJ:  34 cm Hiatus:  37 cm Stomach:  Stomach was empty and distended very well with insufflation. Folds in the proximal stomach were normal. Examination of mucosa at gastric body was normal. Multiple anterior erosions were noted along with 4 mm ulcer  along the posterior wall. Pyloric channel was patent. Angularis fundus and cardia were unremarkable. Duodenum:  Normal bulbar and post bulbar mucosa.  Therapeutic/Diagnostic Maneuvers Performed:   Esophagus was dilated initially bypassing 36 French Maloney dilator and no resistance noted. Esophagus was subsequently dilated by passing 46 and 48 [place] dilators to full insertion. Endoscope was passed again and small linear mucosal disruption noted at the level of UES suggestive of disrupted esophageal web. Gastric biopsies were taken from gastric ulcer and surrounding area. Multiple biopsies were also taken from esophageal mucosa.  Complications:  None  EBL: Minimal  Impression: Esophageal mucosal changes suggestive of eosinophilic esophagitis but no focal or discrete stricture noted. Small sliding hiatal hernia. Multiple antral erosions along with small ulcer.  Esophagus dilated by passing 36, 46 and 48 French Maloney dilators resulting in small linear mucosal disruption at level of UES suggestive of disrupted esophageal web. Esophageal biopsies also taken to rule out eosinophilic esophagitis.   Recommendations:  Standard instructions given. Patient will resume aspirin on 06/29/2015. She may check with Dr. Margo AyeHall if dose could be reduced. I will be contacting patient with biopsy results and further recommendations.  Sharvil Hoey U  06/27/2015  12:43 PM  CC: Dr. Dwana MelenaZack Hall, MD & Dr. Bonnetta BarryNo ref. provider found

## 2015-07-01 ENCOUNTER — Encounter (HOSPITAL_COMMUNITY): Payer: Self-pay | Admitting: Internal Medicine

## 2015-07-06 ENCOUNTER — Telehealth (INDEPENDENT_AMBULATORY_CARE_PROVIDER_SITE_OTHER): Payer: Self-pay | Admitting: Internal Medicine

## 2015-07-06 MED ORDER — FLUTICASONE PROPIONATE HFA 220 MCG/ACT IN AERO: 4.0000 | INHALATION_SPRAY | Freq: Two times a day (BID) | RESPIRATORY_TRACT | Status: DC

## 2015-07-06 NOTE — Telephone Encounter (Signed)
Patient called with results of biopsy and recommendations See note tagged to biopsy reportt

## 2015-07-08 DIAGNOSIS — K279 Peptic ulcer, site unspecified, unspecified as acute or chronic, without hemorrhage or perforation: Secondary | ICD-10-CM | POA: Diagnosis not present

## 2015-07-08 DIAGNOSIS — K209 Esophagitis, unspecified: Secondary | ICD-10-CM | POA: Diagnosis not present

## 2015-07-10 ENCOUNTER — Other Ambulatory Visit (HOSPITAL_COMMUNITY): Payer: Self-pay | Admitting: Psychiatry

## 2015-07-14 ENCOUNTER — Other Ambulatory Visit (HOSPITAL_COMMUNITY): Payer: Self-pay | Admitting: Psychiatry

## 2015-07-14 DIAGNOSIS — F411 Generalized anxiety disorder: Secondary | ICD-10-CM

## 2015-07-14 MED ORDER — GABAPENTIN 300 MG PO CAPS: 300.0000 mg | ORAL_CAPSULE | ORAL | Status: DC | PRN

## 2015-07-15 ENCOUNTER — Encounter (INDEPENDENT_AMBULATORY_CARE_PROVIDER_SITE_OTHER): Payer: Self-pay | Admitting: *Deleted

## 2015-08-13 ENCOUNTER — Ambulatory Visit (INDEPENDENT_AMBULATORY_CARE_PROVIDER_SITE_OTHER): Payer: PPO | Admitting: Psychiatry

## 2015-08-13 ENCOUNTER — Encounter (HOSPITAL_COMMUNITY): Payer: Self-pay | Admitting: Psychiatry

## 2015-08-13 VITALS — BP 120/80 | HR 60 | Ht 63.0 in | Wt 139.0 lb

## 2015-08-13 DIAGNOSIS — F411 Generalized anxiety disorder: Secondary | ICD-10-CM

## 2015-08-13 MED ORDER — DULOXETINE HCL 60 MG PO CPEP: ORAL_CAPSULE | ORAL | Status: DC

## 2015-08-13 MED ORDER — DULOXETINE HCL 30 MG PO CPEP: ORAL_CAPSULE | ORAL | Status: DC

## 2015-08-13 MED ORDER — CLONAZEPAM 0.5 MG PO TABS: 0.5000 mg | ORAL_TABLET | Freq: Three times a day (TID) | ORAL | Status: DC

## 2015-08-13 NOTE — Progress Notes (Signed)
[place] Progress Note  [name] [id number] 70 y.o.  08/13/2015 1:46 PM  Chief Complaint:  I have been very anxious and nervous.,  But it is not working.  I have chest pain month ago and I went to the emergency room.      History of Present Illness: [name] came for her appointment with her husband.  She is complaining of increased anxiety and nervousness.  She does not feel gabapentin is helping her.  She admitted feeling nervous anxious .  Few weeks ago she had seen in the emergency room because of chest pain.  Her troponin was negative .  She was recommended GI and she was seen by Dr. Laural Golden.  She was told it was negative but she still have nausea, difficulty swallowing .  She feels very nervous about her health.  Sometimes she has difficulty falling asleep.  She is taking Klonopin 0.5 mg twice a day and Cymbalta 90 mg she has no tremors shakes or any EPS.  She had blood work in December which was normal.  She denies any paranoia, hallucination, aggressive behavior.  She denies any feeling of hopelessness or worthlessness.  Her appetite is okay.  Her vitals are stable.  Patient denies drinking or using any illegal substances.  She lives with her husband is very supportive.  Her 70th birthday is coming and she is excited about it.  Suicidal Ideation: No Plan Formed: No Patient has means to carry out plan: No  Homicidal Ideation: No Plan Formed: No Patient has means to carry out plan: No  Review of Systems  Constitutional: Negative for weight loss.  HENT: Negative.   Cardiovascular: Negative.   Gastrointestinal: Positive for heartburn and nausea.       Difficulty in swallowing  Musculoskeletal: Positive for back pain.  Skin: Negative.   Neurological: Negative for dizziness, tingling and tremors.  Psychiatric/Behavioral: Negative for suicidal ideas. The patient is nervous/anxious and has insomnia.    Psychiatric: Agitation: No Hallucination: No Depressed  Mood: No Insomnia: Yes Hypersomnia: No Altered Concentration: No Feels Worthless: No Grandiose Ideas: No Belief In Special Powers: No New/Increased Substance Abuse: No Compulsions: No  Neurologic: Headache: No Seizure: No Paresthesias: No   Outpatient Encounter Prescriptions as of 08/13/2015  Medication Sig  . aspirin 325 MG tablet Take 325 mg by mouth daily.  . butalbital-acetaminophen-caffeine (FIORICET, ESGIC) 50-325-40 MG per tablet Take 2 tablets by mouth 2 (two) times daily as needed for headache or migraine.  . clonazePAM (KLONOPIN) 0.5 MG tablet Take 1 tablet (0.5 mg total) by mouth 3 (three) times daily.  . cyclobenzaprine (FLEXERIL) 10 MG tablet Take 10 mg by mouth 3 (three) times daily as needed for muscle spasms.   . DULoxetine (CYMBALTA) 30 MG capsule TAKE 1 CAPSULE BY MOUTH ONCE DAILY.TAKE WITH 60MG.  . DULoxetine (CYMBALTA) 60 MG capsule TAKE (1) CAPSULE BY MOUTH EVERY DAY.TAKE WITH 30MG.  . fluticasone (FLOVENT HFA) 220 MCG/ACT inhaler Inhale 4 puffs into the lungs 2 (two) times daily.  Marland Kitchen levocetirizine (XYZAL) 5 MG tablet Take 5 mg by mouth every evening.  Marland Kitchen levothyroxine (SYNTHROID, LEVOTHROID) 25 MCG tablet Take 25 mcg by mouth daily before breakfast.   . omeprazole (PRILOSEC) 40 MG capsule   . Oxycodone HCl 10 MG TABS Take 10 mg by mouth every 4 (four) hours as needed (pain).   . simvastatin (ZOCOR) 40 MG tablet Take 40 mg by mouth daily at 6 PM.   . valACYclovir (VALTREX) 1000  MG tablet Take 1,000 mg by mouth daily as needed (for fever blisters).  . [DISCONTINUED] clonazePAM (KLONOPIN) 0.5 MG tablet Take 1 tablet (0.5 mg total) by mouth 2 (two) times daily.  . [DISCONTINUED] DULoxetine (CYMBALTA) 30 MG capsule TAKE 1 CAPSULE BY MOUTH ONCE DAILY.TAKE WITH 60MG.  . [DISCONTINUED] DULoxetine (CYMBALTA) 60 MG capsule TAKE (1) CAPSULE BY MOUTH EVERY DAY.TAKE WITH 30MG.  . [DISCONTINUED] gabapentin (NEURONTIN) 300 MG capsule Take 1 capsule (300 mg total) by mouth as  needed (anxiety).  . [DISCONTINUED] omeprazole (PRILOSEC) 20 MG capsule Take 1 capsule (20 mg total) by mouth daily.   No facility-administered encounter medications on file as of 08/13/2015.   Recent Results (from the past 2160 hour(s))  Basic metabolic panel     Status: Abnormal   Collection Time: 05/17/15 12:40 AM  Result Value Ref Range   Sodium 141 135 - 145 mmol/L   Potassium 3.6 3.5 - 5.1 mmol/L   Chloride 105 101 - 111 mmol/L   CO2 27 22 - 32 mmol/L   Glucose, Bld 129 (H) 65 - 99 mg/dL   BUN 8 6 - 20 mg/dL   Creatinine, Ser 0.84 0.44 - 1.00 mg/dL   Calcium 9.1 8.9 - 10.3 mg/dL   GFR calc non Af Amer >60 >60 mL/min   GFR calc Af Amer >60 >60 mL/min    Comment: (NOTE) The eGFR has been calculated using the CKD EPI equation. This calculation has not been validated in all clinical situations. eGFR's persistently <60 mL/min signify possible Chronic Kidney Disease.    Anion gap 9 5 - 15  CBC     Status: Abnormal   Collection Time: 05/17/15 12:40 AM  Result Value Ref Range   WBC 7.1 4.0 - 10.5 K/uL   RBC 3.81 (L) 3.87 - 5.11 MIL/uL   Hemoglobin 12.3 12.0 - 15.0 g/dL   HCT 36.4 36.0 - 46.0 %   MCV 95.5 78.0 - 100.0 fL   MCH 32.3 26.0 - 34.0 pg   MCHC 33.8 30.0 - 36.0 g/dL   RDW 13.2 11.5 - 15.5 %   Platelets 236 150 - 400 K/uL  Hepatic function panel     Status: Abnormal   Collection Time: 05/17/15 12:40 AM  Result Value Ref Range   Total Protein 6.9 6.5 - 8.1 g/dL   Albumin 3.7 3.5 - 5.0 g/dL   AST 19 15 - 41 U/L   ALT 13 (L) 14 - 54 U/L   Alkaline Phosphatase 66 38 - 126 U/L   Total Bilirubin 0.3 0.3 - 1.2 mg/dL   Bilirubin, Direct 0.1 0.1 - 0.5 mg/dL   Indirect Bilirubin 0.2 (L) 0.3 - 0.9 mg/dL  Lipase, blood     Status: None   Collection Time: 05/17/15 12:40 AM  Result Value Ref Range   Lipase 32 11 - 51 U/L  Troponin I     Status: None   Collection Time: 05/17/15 12:40 AM  Result Value Ref Range   Troponin I <0.03 <0.031 ng/mL    Comment:        NO  INDICATION OF MYOCARDIAL INJURY.   I-stat troponin, ED     Status: None   Collection Time: 05/17/15  2:24 AM  Result Value Ref Range   Troponin i, poc 0.00 0.00 - 0.08 ng/mL   Comment 3            Comment: Due to the release kinetics of cTnI, a negative result within the first hours of  the onset of symptoms does not rule out myocardial infarction with certainty. If myocardial infarction is still suspected, repeat the test at appropriate intervals.   Troponin I     Status: None   Collection Time: 05/17/15  4:05 AM  Result Value Ref Range   Troponin I <0.03 <0.031 ng/mL    Comment:        NO INDICATION OF MYOCARDIAL INJURY.   D-dimer, quantitative (not at [facility])     Status: Abnormal   Collection Time: 05/17/15  4:05 AM  Result Value Ref Range   D-Dimer, Quant 0.76 (H) 0.00 - 0.50 ug/mL-FEU    Comment: (NOTE) At the manufacturer cut-off of 0.50 ug/mL FEU, this assay has been documented to exclude PE with a sensitivity and negative predictive value of 97 to 99%.  At this time, this assay has not been approved by the FDA to exclude DVT/VTE. Results should be correlated with clinical presentation.    Past Psychiatric History/Hospitalization(s): Patient has a long history of anxiety disorder.  She was getting her psychiatric medication from her primary care physician until stopped working.  She has taken Effexor in the past.we had tried Valium and she has taken Zoloft in the past but stopped working.  Recently we tried BuSpar but it did not help. Bipolar Disorder: No Depression: Yes Mania: No Psychosis: No Schizophrenia: No Personality Disorder: No Hospitalization for psychiatric illness: No History of Electroconvulsive Shock Therapy: No Prior Suicide Attempts: No   Past Medical Family, Social History:  Patient has history of hyperlipidemia, sinusitis, hypothyroidism, seasonal allergies and chronic pain.  She is seeing pain management for her chronic back pain.  She's  unemployed.  She lives with her husband who is very involved in her treatment.  Patient has a daughter and she is very attached with her daughter.  Her primary care physician is Dr. Nevada Crane   Physical Exam: Constitutional:  BP 120/80 mmHg  Pulse 60  Wt 139 lb (63.05 kg)  General Appearance: alert, oriented, no acute distress and well nourished  Musculoskeletal: Strength & Muscle Tone: Complain of back pain Gait & Station: normal Patient leans: N/A  Mental status examination: Patient is casually dressed and groomed.  She described her mood anxious and nervous.  Her affect is constricted.  She maintained fair eye contact.  She is cooperative.  Her speech is slow but clear and coherent.  She denies any auditory or visual hallucination.  She denies any active or passive suicidal thoughts or homicidal thoughts.  There were no flight of ideas or any loose association.  Her attention concentration is fair.  Her psychomotor activity is slow.  Her attention and concentration is fair.  Her fund of knowledge is good.  She is alert and oriented 3.  She has no tremors, shakes or any EPS.  Her insight judgment and impulse control is okay.  Established Problem, Stable/Improving (1), Review of Psycho-Social Stressors (1), Review or order clinical lab tests (1), Review and summation of old records (2), Established Problem, Worsening (2), Review of Last Therapy Session (1), Review of Medication Regimen & Side Effects (2) and Review of New Medication or Change in Dosage (2)  Assessment: Axis I: Generalized anxiety disorder.    Axis II: Deferred  Axis III:  Patient Active Problem List   Diagnosis Date Noted  . Back pain 05/05/2012  . Fibromyalgia 05/05/2012  . Chest pressure 04/03/2012  . Carotid bruit 04/03/2012  . Anxiety 10/05/2011    Plan: I recommended to increase Klonopin  0.5 mg 3 times a day.  We will discontinue gabapentin it is not working.  Continue Cymbalta 90 mg daily.  Discussed  benzodiazepine dependence tolerance and abuse.  She does not ask early refills for her benzos.  I offered counseling but patient declined.  Discussed medication side effects and benefits.  Recommended to call us back if she has any question or any concern. Patient is not interested in counseling.  Follow-up in 3 months. Discuss safety plan that anytime having active suicidal thoughts or homicidal thoughts than she need to call 911.    Aleene Swanner T., MD 08/13/2015

## 2015-08-14 ENCOUNTER — Telehealth (INDEPENDENT_AMBULATORY_CARE_PROVIDER_SITE_OTHER): Payer: Self-pay | Admitting: Internal Medicine

## 2015-08-14 NOTE — Telephone Encounter (Signed)
[name], [name]'s husband called saying Dr. Karilyn Cota stretched her throat and after doing so found ulcers in her stomach. She was given medication and a spray for this. The pt's symptoms are better but she's out of spray and medication and needs a refill because the symptoms aren't completely healed. She still feels as though the sinus infection is still present. She continues to take Omeprazole daily per [name]. He's wondering if she should be seen or if a refill can be sent to her pharmacy. Please call the patient regarding this.  Pt's ph# [phone] Thank you.

## 2015-08-14 NOTE — Telephone Encounter (Signed)
To be addressed with Dr.Rehman.  Notes Recorded by Malissa Hippo, MD on 07/06/2015 at 3:12 PM Gastric biopsy negative for H. pylori infection. Esophageal biopsy shows focal changes of EoE Results reviewed with patient. Prescription for fluticasone sent her pharmacy. Patient will call if call base to high. Patient given instructions on how to use the medication. Office visit in 3-4 months. Ports to PCP

## 2015-08-15 NOTE — Telephone Encounter (Signed)
Talked with the patient to let her know Dr.Rehman's plan. She told me that she had blisters on her mouth and prior to this her mouth had been sore. She is using Aqaphor for her lips. Talked with Dr.Rehman. He ask that she hold off on using the inhalers, he ask that I call in Magic Mouth Wash - Swish and Swallow four times a day for 10 days. After , she completed this , she may use the inhalers if she is still having problems swallowing.  The prescription was called to [facility]. Patient was given the instructions 2 times and she verbalized them back to me with understanding.

## 2015-08-15 NOTE — Telephone Encounter (Signed)
Per Dr.Rehman the patient may be treated again for 6 weeks with the following. It was called to [facility]. Patient was called and made aware.  fluticasone (FLOVENT HFA) 220 MCG/ACT inhaler 4 Inhaler 0 07/06/2015     Sig - Route: Inhale 4 puffs into the lungs 2 (two) times daily. - Inhalation    Patient will currently keep her appointment with Terri for Oct 14 2015.

## 2015-09-02 ENCOUNTER — Ambulatory Visit (INDEPENDENT_AMBULATORY_CARE_PROVIDER_SITE_OTHER): Payer: PPO | Admitting: Internal Medicine

## 2015-09-02 ENCOUNTER — Encounter (INDEPENDENT_AMBULATORY_CARE_PROVIDER_SITE_OTHER): Payer: Self-pay | Admitting: Internal Medicine

## 2015-09-02 VITALS — BP 140/78 | HR 70 | Temp 98.0°F | Resp 18 | Ht 63.0 in | Wt 135.7 lb

## 2015-09-02 DIAGNOSIS — R1013 Epigastric pain: Secondary | ICD-10-CM

## 2015-09-02 DIAGNOSIS — K279 Peptic ulcer, site unspecified, unspecified as acute or chronic, without hemorrhage or perforation: Secondary | ICD-10-CM | POA: Diagnosis not present

## 2015-09-02 DIAGNOSIS — R1319 Other dysphagia: Secondary | ICD-10-CM

## 2015-09-02 DIAGNOSIS — K200 Eosinophilic esophagitis: Secondary | ICD-10-CM

## 2015-09-02 DIAGNOSIS — R1310 Dysphagia, unspecified: Secondary | ICD-10-CM

## 2015-09-02 DIAGNOSIS — R1314 Dysphagia, pharyngoesophageal phase: Secondary | ICD-10-CM | POA: Diagnosis not present

## 2015-09-02 MED ORDER — HYOSCYAMINE SULFATE 0.125 MG SL SUBL: 0.1250 mg | SUBLINGUAL_TABLET | Freq: Three times a day (TID) | SUBLINGUAL | Status: DC | PRN

## 2015-09-02 MED ORDER — OMEPRAZOLE 20 MG PO CPDR: 20.0000 mg | DELAYED_RELEASE_CAPSULE | Freq: Two times a day (BID) | ORAL | Status: DC

## 2015-09-02 NOTE — Progress Notes (Signed)
Presenting complaint;  Follow-up for dysphagia and eosinophilic esophagitis. Patient complains of epigastric pain and bloating.  Database:  [name] is 70 year old Caucasian female who was last seen in the office on 06/25/2015 for dysphagia. She underwent EGD on 06/27/2015 revealing typical changes of eosinophilic esophagitis. Esophagus was dilated by passing Maloney dilators to 48 [place] resulting in small mucosal disruption at cervical esophagus. She was also found to have erosive gastritis and a small antral ulcer. Esophageal biopsy confirmed eosinophilic esophagitis and gastric biopsy revealed reactive gastropathy with erosion and  was negative for H. Pylori. Patient was given prescription for fluticasone and now returns for follow-up visit.  Subjective:  Patient now presents for scheduled visit. She is accompanied by her husband. She states she was not able to finish therapy the first time because she developed blisters involving oral mucosa. She has history of herpes simplex stomatitis. She took Valtrex with resolution. She has been back on fluticasone for about 3 weeks and has not had any problems. She feels swallowing has improved. She is able to swallow pills and food better than she was able to quit swallowing is not normal. She has not had any episode of food impaction resulting in regurgitation. She also complains of epigastric pain which generally occurs after evening meal and associated bloating. She has not experienced nausea vomiting melena or rectal bleeding. She has been on full dose aspirin because of multiple strokes. She takes omeprazole 40 mg before breakfast.   she also complains of frequent urination. She recalls she had urethral dilation by Dr. Venetia Constableenenbaum several years ago.    Current Medications: Outpatient Encounter Prescriptions as of 09/02/2015  Medication Sig  . aspirin 325 MG tablet Take 325 mg by mouth daily.  . butalbital-acetaminophen-caffeine (FIORICET, ESGIC)  50-325-40 MG per tablet Take 2 tablets by mouth 3 (three) times daily as needed for headache or migraine.   . clonazePAM (KLONOPIN) 0.5 MG tablet Take 1 tablet (0.5 mg total) by mouth 3 (three) times daily.  . cyclobenzaprine (FLEXERIL) 10 MG tablet Take 10 mg by mouth 3 (three) times daily as needed for muscle spasms.   . DULoxetine (CYMBALTA) 30 MG capsule TAKE 1 CAPSULE BY MOUTH ONCE DAILY.TAKE WITH 60MG .  . DULoxetine (CYMBALTA) 60 MG capsule TAKE (1) CAPSULE BY MOUTH EVERY DAY.TAKE WITH 30MG .  . fluticasone (FLOVENT HFA) 220 MCG/ACT inhaler Inhale 4 puffs into the lungs 2 (two) times daily.  Marland Kitchen. levothyroxine (SYNTHROID, LEVOTHROID) 25 MCG tablet Take 25 mcg by mouth daily before breakfast.   . nystatin (MYCOSTATIN) 100000 UNIT/ML suspension Use as directed 5 mLs in the mouth or throat 4 (four) times daily.   Marland Kitchen. omeprazole (PRILOSEC) 40 MG capsule Take 40 mg by mouth daily.   . Oxycodone HCl 10 MG TABS Take 10 mg by mouth every 4 (four) hours as needed (pain).   . simvastatin (ZOCOR) 40 MG tablet Take 40 mg by mouth daily at 6 PM.   . valACYclovir (VALTREX) 1000 MG tablet Take 1,000 mg by mouth daily as needed (for fever blisters).  . [DISCONTINUED] levocetirizine (XYZAL) 5 MG tablet Take 5 mg by mouth every evening. Reported on 09/02/2015   No facility-administered encounter medications on file as of 09/02/2015.     Objective: Blood pressure 140/78, pulse 70, temperature 98 F (36.7 C), temperature source Oral, resp. rate 18, height 5\' 3"  (1.6 m), weight 135 lb 11.2 oz (61.553 kg). Patient is alert and in no acute distress. Conjunctiva is pink. Sclera is nonicteric Oropharyngeal mucosa is  normal. No neck masses or thyromegaly noted. Cardiac exam with regular rhythm normal S1 and S2. No murmur or gallop noted. Lungs are clear to auscultation. Abdomen is symmetrical and soft. She has mild midepigastric tenderness. No organomegaly or masses.  No LE edema or clubbing  noted.   Assessment:  #1. Esophageal dysphagia. Patient underwent EGD about 9 weeks ago revealing typical changes of eosinophilic esophagitis without focal stricture. She has not finishes fluticasone yet. She may have an underlying esophageal motility disorder based on barium study. It remains to be seen, which benefited she will have with topical steroid therapy. #2. Epigastric pain and bloating most likely secondary to peptic ulcer disease. She was found to have erosive gastritis and 4 mm gastric ulcer. Gastric biopsy was negative for H. Pylori. Persistent symptoms may be due to full dose aspirin which she needs given history of multiple CVAs.   Plan:  Change omeprazole dose to 20 mg by mouth twice a day; before breakfast and evening meal daily. Hyoscyamine sublingual 0.125 mg 3 times a day when necessary. Patient will call with progress report when she finishes fluticasone for EoE. She will talk with Dr. Catalina Pizza regarding frequent urination.  Office visit in 4 months.

## 2015-09-02 NOTE — Patient Instructions (Signed)
Call office with progress report regarding swallowing difficulty and abdominal pain in 3-4 weeks.

## 2015-09-05 DIAGNOSIS — E559 Vitamin D deficiency, unspecified: Secondary | ICD-10-CM | POA: Diagnosis not present

## 2015-09-05 DIAGNOSIS — R7301 Impaired fasting glucose: Secondary | ICD-10-CM | POA: Diagnosis not present

## 2015-09-05 DIAGNOSIS — E782 Mixed hyperlipidemia: Secondary | ICD-10-CM | POA: Diagnosis not present

## 2015-09-05 DIAGNOSIS — E039 Hypothyroidism, unspecified: Secondary | ICD-10-CM | POA: Diagnosis not present

## 2015-09-09 DIAGNOSIS — E039 Hypothyroidism, unspecified: Secondary | ICD-10-CM | POA: Diagnosis not present

## 2015-09-09 DIAGNOSIS — R7301 Impaired fasting glucose: Secondary | ICD-10-CM | POA: Diagnosis not present

## 2015-09-09 DIAGNOSIS — E559 Vitamin D deficiency, unspecified: Secondary | ICD-10-CM | POA: Diagnosis not present

## 2015-09-09 DIAGNOSIS — E782 Mixed hyperlipidemia: Secondary | ICD-10-CM | POA: Diagnosis not present

## 2015-10-14 ENCOUNTER — Ambulatory Visit (INDEPENDENT_AMBULATORY_CARE_PROVIDER_SITE_OTHER): Payer: Self-pay | Admitting: Internal Medicine

## 2015-11-13 ENCOUNTER — Ambulatory Visit (HOSPITAL_COMMUNITY): Payer: Self-pay | Admitting: Psychiatry

## 2015-11-14 ENCOUNTER — Encounter (HOSPITAL_COMMUNITY): Payer: Self-pay | Admitting: Psychiatry

## 2015-11-14 ENCOUNTER — Ambulatory Visit (INDEPENDENT_AMBULATORY_CARE_PROVIDER_SITE_OTHER): Payer: PPO | Admitting: Psychiatry

## 2015-11-14 VITALS — BP 140/78 | HR 97 | Ht 63.0 in | Wt 142.4 lb

## 2015-11-14 DIAGNOSIS — F411 Generalized anxiety disorder: Secondary | ICD-10-CM

## 2015-11-14 MED ORDER — DULOXETINE HCL 30 MG PO CPEP: ORAL_CAPSULE | ORAL | Status: DC

## 2015-11-14 MED ORDER — CLONAZEPAM 0.5 MG PO TABS: 0.5000 mg | ORAL_TABLET | Freq: Three times a day (TID) | ORAL | Status: DC

## 2015-11-14 MED ORDER — DULOXETINE HCL 60 MG PO CPEP: ORAL_CAPSULE | ORAL | Status: DC

## 2015-11-14 NOTE — Progress Notes (Signed)
[facility] [id number] Progress Note  [name] [id number] 70 y.o.  11/14/2015 10:35 AM  Chief Complaint:  Medication management and follow-up.       History of Present Illness: [name] came for her appointment.  Usually she comes with her husband but today she came by herself.  She is doing very well since the Klonopin dose increase.  She is less anxious and less nervous.  She recently seen her GI and her esophagus was stretched .  She admitted it is helping and she has no more chest pain.  She is sleeping better.  She denies any irritability, anger, mood swing.  She denies any paranoia or any hallucination.  She endorse her anxiety and depression is well controlled on Klonopin and Cymbalta.  She has no tremors shakes or any EPS.  Her sleep is good.  Her appetite is okay.  Her vitals are stable. Patient denies drinking or using any illegal substances.  She lives with her husband is very supportive.    Suicidal Ideation: No Plan Formed: No Patient has means to carry out plan: No  Homicidal Ideation: No Plan Formed: No Patient has means to carry out plan: No  Review of Systems  Constitutional: Negative for weight loss.  HENT: Negative.   Cardiovascular: Negative.   Musculoskeletal: Positive for back pain.  Skin: Negative.   Neurological: Negative for dizziness, tingling and tremors.  Psychiatric/Behavioral: Negative for suicidal ideas.   Psychiatric: Agitation: No Hallucination: No Depressed Mood: No Insomnia: No Hypersomnia: No Altered Concentration: No Feels Worthless: No Grandiose Ideas: No Belief In Special Powers: No New/Increased Substance Abuse: No Compulsions: No  Neurologic: Headache: No Seizure: No Paresthesias: No   Outpatient Encounter Prescriptions as of 11/14/2015  Medication Sig  . aspirin 325 MG tablet Take 325 mg by mouth daily.  . butalbital-acetaminophen-caffeine (FIORICET, ESGIC) 50-325-40 MG per tablet Take 2 tablets by mouth 3 (three)  times daily as needed for headache or migraine.   . clonazePAM (KLONOPIN) 0.5 MG tablet Take 1 tablet (0.5 mg total) by mouth 3 (three) times daily.  . cyclobenzaprine (FLEXERIL) 10 MG tablet Take 10 mg by mouth 3 (three) times daily as needed for muscle spasms.   . DULoxetine (CYMBALTA) 30 MG capsule TAKE 1 CAPSULE BY MOUTH ONCE DAILY.TAKE WITH .  . DULoxetine (CYMBALTA) 60 MG capsule TAKE (1) CAPSULE BY MOUTH EVERY DAY.TAKE WITH .  . fluticasone (FLOVENT HFA) 220 MCG/ACT inhaler Inhale 4 puffs into the lungs 2 (two) times daily.  . hyoscyamine (LEVSIN SL) 0.125 MG SL tablet Place 1 tablet (0.125 mg total) under the tongue 3 (three) times daily as needed.  Marland Kitchen levothyroxine (SYNTHROID, LEVOTHROID) 25 MCG tablet Take 25 mcg by mouth daily before breakfast.   . nystatin (MYCOSTATIN) 100000 UNIT/ML suspension Use as directed 5 mLs in the mouth or throat 4 (four) times daily.   Marland Kitchen omeprazole (PRILOSEC) 20 MG capsule Take 1 capsule (20 mg total) by mouth 2 (two) times daily before a meal.  . Oxycodone HCl 10 MG TABS Take 10 mg by mouth every 4 (four) hours as needed (pain).   . simvastatin (ZOCOR) 40 MG tablet Take 40 mg by mouth daily at 6 PM.   . valACYclovir (VALTREX) 1000 MG tablet Take 1,000 mg by mouth daily as needed (for fever blisters).  . [DISCONTINUED] clonazePAM (KLONOPIN) 0.5 MG tablet Take 1 tablet (0.5 mg total) by mouth 3 (three) times daily.  . [DISCONTINUED] DULoxetine (CYMBALTA) 30 MG capsule TAKE 1 CAPSULE  BY MOUTH ONCE DAILY.TAKE WITH 60MG .  . [DISCONTINUED] DULoxetine (CYMBALTA) 60 MG capsule TAKE (1) CAPSULE BY MOUTH EVERY DAY.TAKE WITH 30MG .   No facility-administered encounter medications on file as of 11/14/2015.   No results found for this or any previous visit (from the past 2160 hour(s)). Past Psychiatric History/Hospitalization(s): Patient has a long history of anxiety disorder.  She was getting her psychiatric medication from her primary care physician until stopped  working.  She has taken Effexor in the past.we had tried Valium and she has taken Zoloft in the past but stopped working.  Recently we tried BuSpar but it did not help. Bipolar Disorder: No Depression: Yes Mania: No Psychosis: No Schizophrenia: No Personality Disorder: No Hospitalization for psychiatric illness: No History of Electroconvulsive Shock Therapy: No Prior Suicide Attempts: No   Past Medical Family, Social History:  Patient has history of hyperlipidemia, sinusitis, hypothyroidism, seasonal allergies and chronic pain.  She is seeing pain management for her chronic back pain.  She's unemployed.  She lives with her husband who is very involved in her treatment.  Patient has a daughter and she is very attached with her daughter.  Her primary care physician is Dr. Margo Murillo   Physical Exam: Constitutional:  BP 140/78 mmHg  Pulse 97  Ht 5\' 3"  (1.6 m)  Wt 142 lb 6.4 oz (64.592 kg)  BMI 25.23 kg/m2  General Appearance: alert, oriented, no acute distress and well nourished  Musculoskeletal: Strength & Muscle Tone: Complain of back pain Gait & Station: normal Patient leans: N/A  Mental status examination: Patient is casually dressed and groomed.  She is cooperative and maintained good eye contact.  She described her mood euthymic and her affect is appropriate.  Her speech is slow but clear and coherent.  She denies any auditory or visual hallucination.  She denies any active or passive suicidal thoughts or homicidal thoughts.  There were no flight of ideas or any loose association.  Her attention concentration is fair.  Her psychomotor activity is slow.  Her attention and concentration is fair.  Her fund of knowledge is good.  She is alert and oriented 3.  She has no tremors, shakes or any EPS.  Her insight judgment and impulse control is okay.  Established Problem, Stable/Improving (1), Review of Psycho-Social Stressors (1), Review of Last Therapy Session (1) and Review of Medication  Regimen & Side Effects (2)  Assessment: Axis I: Generalized anxiety disorder.    Axis II: Deferred  Axis III:  Patient Active Problem List   Diagnosis Date Noted  . Back pain 05/05/2012  . Fibromyalgia 05/05/2012  . Chest pressure 04/03/2012  . Carotid bruit 04/03/2012  . Anxiety 10/05/2011    Plan: Patient doing better on Klonopin and Cymbalta.  She has no side effects.  I will continue Klonopin 0.5 mg 3 times a day and Cymbalta 90 mg daily.  This medication side effects and benefits. Discussed benzodiazepine dependence tolerance and abuse.  She does not ask early refills for her benzos.  Recommended to call us back if she has any question.  Follow-up in 3 months. Discuss safety plan that anytime having active suicidal thoughts or homicidal thoughts than she need to call 911.    Hiyab Nhem T., MD 11/14/2015

## 2015-11-28 DIAGNOSIS — G894 Chronic pain syndrome: Secondary | ICD-10-CM | POA: Diagnosis not present

## 2016-01-06 ENCOUNTER — Ambulatory Visit (INDEPENDENT_AMBULATORY_CARE_PROVIDER_SITE_OTHER): Payer: Self-pay | Admitting: Internal Medicine

## 2016-01-19 DIAGNOSIS — G894 Chronic pain syndrome: Secondary | ICD-10-CM | POA: Diagnosis not present

## 2016-02-12 ENCOUNTER — Other Ambulatory Visit (HOSPITAL_COMMUNITY): Payer: Self-pay | Admitting: Psychiatry

## 2016-02-12 DIAGNOSIS — F411 Generalized anxiety disorder: Secondary | ICD-10-CM

## 2016-02-13 ENCOUNTER — Other Ambulatory Visit (HOSPITAL_COMMUNITY): Payer: Self-pay | Admitting: Psychiatry

## 2016-02-13 ENCOUNTER — Other Ambulatory Visit (HOSPITAL_COMMUNITY): Payer: Self-pay

## 2016-02-13 DIAGNOSIS — F411 Generalized anxiety disorder: Secondary | ICD-10-CM

## 2016-02-13 MED ORDER — DULOXETINE HCL 30 MG PO CPEP: ORAL_CAPSULE | ORAL | 1 refills | Status: DC

## 2016-02-13 MED ORDER — DULOXETINE HCL 60 MG PO CPEP: ORAL_CAPSULE | ORAL | 1 refills | Status: DC

## 2016-02-13 MED ORDER — CLONAZEPAM 0.5 MG PO TABS: 0.5000 mg | ORAL_TABLET | Freq: Three times a day (TID) | ORAL | 1 refills | Status: DC

## 2016-02-13 NOTE — Progress Notes (Signed)
Patients husband calling for refill, they were rescheduled to Oct. Refill appropriate per protocol, sent in one month with a refill to get to appointment

## 2016-02-17 ENCOUNTER — Ambulatory Visit (HOSPITAL_COMMUNITY): Payer: Self-pay | Admitting: Psychiatry

## 2016-02-18 DIAGNOSIS — S0031XA Abrasion of nose, initial encounter: Secondary | ICD-10-CM | POA: Diagnosis not present

## 2016-02-18 DIAGNOSIS — G894 Chronic pain syndrome: Secondary | ICD-10-CM | POA: Diagnosis not present

## 2016-02-25 DIAGNOSIS — E559 Vitamin D deficiency, unspecified: Secondary | ICD-10-CM | POA: Diagnosis not present

## 2016-02-25 DIAGNOSIS — E039 Hypothyroidism, unspecified: Secondary | ICD-10-CM | POA: Diagnosis not present

## 2016-02-25 DIAGNOSIS — E782 Mixed hyperlipidemia: Secondary | ICD-10-CM | POA: Diagnosis not present

## 2016-02-25 DIAGNOSIS — R7301 Impaired fasting glucose: Secondary | ICD-10-CM | POA: Diagnosis not present

## 2016-02-27 DIAGNOSIS — G894 Chronic pain syndrome: Secondary | ICD-10-CM | POA: Diagnosis not present

## 2016-02-27 DIAGNOSIS — E782 Mixed hyperlipidemia: Secondary | ICD-10-CM | POA: Diagnosis not present

## 2016-02-27 DIAGNOSIS — F339 Major depressive disorder, recurrent, unspecified: Secondary | ICD-10-CM | POA: Diagnosis not present

## 2016-02-27 DIAGNOSIS — E039 Hypothyroidism, unspecified: Secondary | ICD-10-CM | POA: Diagnosis not present

## 2016-02-27 DIAGNOSIS — R7301 Impaired fasting glucose: Secondary | ICD-10-CM | POA: Diagnosis not present

## 2016-02-27 DIAGNOSIS — E559 Vitamin D deficiency, unspecified: Secondary | ICD-10-CM | POA: Diagnosis not present

## 2016-03-19 DIAGNOSIS — G894 Chronic pain syndrome: Secondary | ICD-10-CM | POA: Diagnosis not present

## 2016-03-19 DIAGNOSIS — Z23 Encounter for immunization: Secondary | ICD-10-CM | POA: Diagnosis not present

## 2016-03-19 DIAGNOSIS — M545 Low back pain: Secondary | ICD-10-CM | POA: Diagnosis not present

## 2016-03-22 ENCOUNTER — Encounter (HOSPITAL_COMMUNITY): Payer: Self-pay | Admitting: Cardiology

## 2016-03-22 ENCOUNTER — Emergency Department (HOSPITAL_COMMUNITY): Payer: PPO

## 2016-03-22 ENCOUNTER — Emergency Department (HOSPITAL_COMMUNITY)
Admission: EM | Admit: 2016-03-22 | Discharge: 2016-03-22 | Disposition: A | Discharge status: Home / Self Care | Payer: PPO | Attending: Emergency Medicine | Admitting: Emergency Medicine

## 2016-03-22 DIAGNOSIS — R069 Unspecified abnormalities of breathing: Secondary | ICD-10-CM | POA: Diagnosis not present

## 2016-03-22 DIAGNOSIS — R39198 Other difficulties with micturition: Secondary | ICD-10-CM | POA: Insufficient documentation

## 2016-03-22 DIAGNOSIS — Z79899 Other long term (current) drug therapy: Secondary | ICD-10-CM | POA: Insufficient documentation

## 2016-03-22 DIAGNOSIS — F17210 Nicotine dependence, cigarettes, uncomplicated: Secondary | ICD-10-CM | POA: Diagnosis not present

## 2016-03-22 DIAGNOSIS — K5900 Constipation, unspecified: Secondary | ICD-10-CM | POA: Diagnosis not present

## 2016-03-22 DIAGNOSIS — I471 Supraventricular tachycardia: Secondary | ICD-10-CM | POA: Insufficient documentation

## 2016-03-22 DIAGNOSIS — Z7982 Long term (current) use of aspirin: Secondary | ICD-10-CM | POA: Insufficient documentation

## 2016-03-22 DIAGNOSIS — M546 Pain in thoracic spine: Secondary | ICD-10-CM | POA: Diagnosis not present

## 2016-03-22 DIAGNOSIS — R000 Tachycardia, unspecified: Secondary | ICD-10-CM | POA: Diagnosis not present

## 2016-03-22 DIAGNOSIS — R079 Chest pain, unspecified: Secondary | ICD-10-CM | POA: Diagnosis not present

## 2016-03-22 DIAGNOSIS — R0602 Shortness of breath: Secondary | ICD-10-CM | POA: Diagnosis not present

## 2016-03-22 DIAGNOSIS — E039 Hypothyroidism, unspecified: Secondary | ICD-10-CM | POA: Insufficient documentation

## 2016-03-22 LAB — BASIC METABOLIC PANEL
ANION GAP: 15 (ref 5–15)
BUN: 15 mg/dL (ref 6–20)
CO2: 23 mmol/L (ref 22–32)
CREATININE: 0.89 mg/dL (ref 0.44–1.00)
Calcium: 9.9 mg/dL (ref 8.9–10.3)
Chloride: 103 mmol/L (ref 101–111)
GFR calc non Af Amer: >60 mL/min (ref >60)
Glucose, Bld: 98 mg/dL (ref 65–99)
Potassium: 3.4 mmol/L — ABNORMAL LOW (ref 3.5–5.1)
SODIUM: 141 mmol/L (ref 135–145)

## 2016-03-22 LAB — CBC WITH DIFFERENTIAL/PLATELET
Basophils Absolute: 0.1 10*3/uL (ref 0.0–0.1)
Basophils Relative: 0 %
Eosinophils Absolute: 0.1 10*3/uL (ref 0.0–0.7)
Eosinophils Relative: 1 %
HEMATOCRIT: 47.6 % — AB (ref 36.0–46.0)
Hemoglobin: 16.3 g/dL — ABNORMAL HIGH (ref 12.0–15.0)
LYMPHS ABS: 5.7 10*3/uL — AB (ref 0.7–4.0)
LYMPHS PCT: 39 %
MCH: 32.6 pg (ref 26.0–34.0)
MCHC: 34.2 g/dL (ref 30.0–36.0)
MCV: 95.2 fL (ref 78.0–100.0)
Monocytes Absolute: 1.4 10*3/uL — ABNORMAL HIGH (ref 0.1–1.0)
Monocytes Relative: 10 %
NEUTROS ABS: 7.5 10*3/uL (ref 1.7–7.7)
NEUTROS PCT: 50 %
PLATELETS: 344 10*3/uL (ref 150–400)
RBC: 5 MIL/uL (ref 3.87–5.11)
RDW: 14.4 % (ref 11.5–15.5)
WBC: 14.8 10*3/uL — AB (ref 4.0–10.5)

## 2016-03-22 LAB — I-STAT TROPONIN, ED: TROPONIN I, POC: 0.01 ng/mL (ref 0.00–0.08)

## 2016-03-22 LAB — TSH: TSH: 7.027 u[IU]/mL — ABNORMAL HIGH (ref 0.350–4.500)

## 2016-03-22 LAB — I-STAT CHEM 8, ED
BUN: 14 mg/dL (ref 6–20)
CREATININE: 0.8 mg/dL (ref 0.44–1.00)
Calcium, Ion: 1.14 mmol/L — ABNORMAL LOW (ref 1.15–1.40)
Chloride: 106 mmol/L (ref 101–111)
Glucose, Bld: 105 mg/dL — ABNORMAL HIGH (ref 65–99)
HEMATOCRIT: 51 % — AB (ref 36.0–46.0)
HEMOGLOBIN: 17.3 g/dL — AB (ref 12.0–15.0)
POTASSIUM: 3.6 mmol/L (ref 3.5–5.1)
Sodium: 145 mmol/L (ref 135–145)
TCO2: 23 mmol/L (ref 0–100)

## 2016-03-22 LAB — URINE MICROSCOPIC-ADD ON
BACTERIA UA: NONE SEEN
SQUAMOUS EPITHELIAL / LPF: NONE SEEN
WBC, UA: NONE SEEN WBC/hpf (ref 0–5)

## 2016-03-22 LAB — URINALYSIS, ROUTINE W REFLEX MICROSCOPIC
Bilirubin Urine: NEGATIVE
Glucose, UA: NEGATIVE mg/dL
Ketones, ur: NEGATIVE mg/dL
Leukocytes, UA: NEGATIVE
NITRITE: NEGATIVE
Protein, ur: NEGATIVE mg/dL
pH: 7 (ref 5.0–8.0)

## 2016-03-22 MED ORDER — ONDANSETRON HCL 4 MG/2ML IJ SOLN
INTRAMUSCULAR | Status: AC
  Filled 2016-03-22: qty 2

## 2016-03-22 MED ORDER — ONDANSETRON HCL 4 MG/2ML IJ SOLN
4.0000 mg | Freq: Once | INTRAMUSCULAR | Status: AC
  Administered 2016-03-22: 4 mg via INTRAVENOUS

## 2016-03-22 MED ORDER — MORPHINE SULFATE (PF) 4 MG/ML IV SOLN
INTRAVENOUS | Status: AC
  Filled 2016-03-22: qty 1

## 2016-03-22 MED ORDER — MORPHINE SULFATE (PF) 4 MG/ML IV SOLN
4.0000 mg | Freq: Once | INTRAVENOUS | Status: AC
  Administered 2016-03-22: 4 mg via INTRAVENOUS

## 2016-03-22 MED ORDER — ADENOSINE 6 MG/2ML IV SOLN
6.0000 mg | Freq: Once | INTRAVENOUS | Status: AC
  Administered 2016-03-22: 6 mg via INTRAVENOUS

## 2016-03-22 MED ORDER — SODIUM CHLORIDE 0.9 % IV BOLUS (SEPSIS)
500.0000 mL | Freq: Once | INTRAVENOUS | Status: AC
  Administered 2016-03-22: 500 mL via INTRAVENOUS

## 2016-03-22 MED ORDER — MORPHINE SULFATE (PF) 2 MG/ML IV SOLN
2.0000 mg | Freq: Once | INTRAVENOUS | Status: AC
  Administered 2016-03-22: 2 mg via INTRAVENOUS
  Filled 2016-03-22: qty 1

## 2016-03-22 MED ORDER — PROMETHAZINE HCL 25 MG/ML IJ SOLN
12.5000 mg | Freq: Once | INTRAMUSCULAR | Status: AC
  Administered 2016-03-22: 12.5 mg via INTRAVENOUS
  Filled 2016-03-22: qty 1

## 2016-03-22 MED ORDER — ADENOSINE 6 MG/2ML IV SOLN
INTRAVENOUS | Status: AC
  Filled 2016-03-22: qty 10

## 2016-03-22 NOTE — Discharge Instructions (Signed)
You had an episode of supraventricular tachycardia today that has resolved. If this happens again, you can try bearing down for several seconds. If it does not resolve please return to the ER.  Please stop taking the steroids, and start taking your levothyroxine again.  Please see your primary care doctor in the near future for management of your hypothyroidism.  [facility].  Work with your PCP to have your spine MRI scheduled, there are no acute exam findings today that make us concerned for an emergency.

## 2016-03-22 NOTE — ED Provider Notes (Signed)
[facility]-EMERGENCY DEPT Provider Note   CSN: [id number] Arrival date & time: 03/22/16  0741     History   Chief Complaint Chief Complaint  Patient presents with  . Tachycardia    HPI [name] is a 70 y.o. female.  PMH back pain, fibromyalgia, neuropathy, stroke called EMS for palpitations and shortness of breath since yesterday. Presented with heart rate up to 190, short of breath, complaining of suprapubic pain and needing to urinate. EKG revealed rhythm consistent with SVT. Administered 6 mg of adenosine to effect. Patient was unable to urinate, underwent in-out cath produced approx 500 mL of urine, sent for testing. Patient reports that the palpitations and shortness of breath began yesterday and were severe but stopped overnight. She denies past experience with this. Was started on prednisone on Friday by her primary care provider for low back pain, presumed lumbar radiculopathy and is in the process of having a lumbar MRI scheduled and referred to orthopedic surgeon in [place]. Takes oxycodone 10mg  Q4 prn for her chronic back pain. Patient endorses chronic low back pain, neck pain, knee pain that has acutely worsened in past 3 weeks. States that she is fatigued and weak and has not been able to sleep for 1-2 days. She denies saddle anesthesia or urinary/bowel incontinence, has generalized weakness but no focal deficits.      Past Medical History:  Diagnosis Date  . Anxiety   . Back pain   . Fibromyalgia   . Hyperlipemia   . Hypothyroidism   . Neuropathy (HCC)   . Sinusitis   . Stroke [facility])    no deficits    Patient Active Problem List   Diagnosis Date Noted  . Back pain 05/05/2012  . Fibromyalgia 05/05/2012  . Chest pressure 04/03/2012  . Carotid bruit 04/03/2012  . Anxiety 10/05/2011    Past Surgical History:  Procedure Laterality Date  . ABDOMINAL HYSTERECTOMY    . CATARACT EXTRACTION Bilateral   . CHOLECYSTECTOMY    . ESOPHAGEAL DILATION N/A 06/27/2015     Procedure: ESOPHAGEAL DILATION;  Surgeon: Malissa Hippo, MD;  Location: [facility] ENDO SUITE;  Service: Endoscopy;  Laterality: N/A;  . ESOPHAGOGASTRODUODENOSCOPY (EGD) WITH PROPOFOL N/A 06/27/2015   Procedure: ESOPHAGOGASTRODUODENOSCOPY (EGD) WITH PROPOFOL;  Surgeon: Malissa Hippo, MD;  Location: [facility] ENDO SUITE;  Service: Endoscopy;  Laterality: N/A;    OB History    No data available       Home Medications    Prior to Admission medications   Medication Sig Start Date End Date Taking? Authorizing Provider  aspirin 325 MG tablet Take 325 mg by mouth daily.    Historical Provider, MD  butalbital-acetaminophen-caffeine (FIORICET, ESGIC) 50-325-40 MG per tablet Take 2 tablets by mouth 3 (three) times daily as needed for headache or migraine.     Historical Provider, MD  clonazePAM (KLONOPIN) 0.5 MG tablet Take 1 tablet (0.5 mg total) by mouth 3 (three) times daily. 02/13/16   Cleotis Nipper, MD  cyclobenzaprine (FLEXERIL) 10 MG tablet Take 10 mg by mouth 3 (three) times daily as needed for muscle spasms.  12/19/12   Historical Provider, MD  DULoxetine (CYMBALTA) 30 MG capsule TAKE 1 CAPSULE BY MOUTH ONCE DAILY.TAKE WITH 60MG . 02/13/16   Cleotis Nipper, MD  DULoxetine (CYMBALTA) 60 MG capsule TAKE (1) CAPSULE BY MOUTH EVERY DAY.TAKE WITH 30MG . 02/13/16   Cleotis Nipper, MD  fluticasone (FLOVENT HFA) 220 MCG/ACT inhaler Inhale 4 puffs into the lungs 2 (two) times daily. 07/06/15  Malissa Hippo, MD  hyoscyamine (LEVSIN SL) 0.125 MG SL tablet Place 1 tablet (0.125 mg total) under the tongue 3 (three) times daily as needed. 09/02/15   Malissa Hippo, MD  levothyroxine (SYNTHROID, LEVOTHROID) 25 MCG tablet Take 25 mcg by mouth daily before breakfast.  04/24/15   Historical Provider, MD  nystatin (MYCOSTATIN) 100000 UNIT/ML suspension Use as directed 5 mLs in the mouth or throat 4 (four) times daily.  08/15/15   Historical Provider, MD  omeprazole (PRILOSEC) 20 MG capsule Take 1 capsule (20 mg total) by mouth 2  (two) times daily before a meal. 09/02/15   Malissa Hippo, MD  Oxycodone HCl 10 MG TABS Take 10 mg by mouth every 4 (four) hours as needed (pain).  02/26/13   Historical Provider, MD  simvastatin (ZOCOR) 40 MG tablet Take 40 mg by mouth daily at 6 PM.  04/29/14   Historical Provider, MD  valACYclovir (VALTREX) 1000 MG tablet Take 1,000 mg by mouth daily as needed (for fever blisters).    Historical Provider, MD    Family History Family History  Problem Relation Age of Onset  . Depression Mother   . Depression Father   . Depression Sister   . Anxiety disorder Sister   . Anxiety disorder Sister   . Alcohol abuse Neg Hx   . Drug abuse Neg Hx     Social History Social History  Substance Use Topics  . Smoking status: Current Every Day Smoker    Packs/day: 0.25    Years: 50.00    Types: Cigarettes  . Smokeless tobacco: Never Used     Comment: smokes 1/2 pack every 2 day.  . Alcohol use No     Allergies   Codeine and Prednisone   Review of Systems Review of Systems  Constitutional: Positive for fatigue. Negative for chills and fever.  Respiratory: Positive for shortness of breath. Negative for cough and chest tightness.   Cardiovascular: Positive for chest pain and palpitations. Negative for leg swelling.  Gastrointestinal: Positive for constipation. Negative for abdominal pain, diarrhea, nausea and vomiting.  Genitourinary: Positive for difficulty urinating. Negative for decreased urine volume, dysuria, flank pain and frequency.  All other systems reviewed and are negative.    Physical Exam Updated Vital Signs BP 177/91   Pulse 87   Resp 26   SpO2 100%   Physical Exam  Constitutional: She is oriented to person, place, and time. She appears well-developed and well-nourished. She appears distressed.  HENT:  Head: Normocephalic and atraumatic.  Eyes: EOM are normal. Pupils are equal, round, and reactive to light.  Neck: Normal range of motion. Neck supple. No JVD  present.  Cardiovascular: Normal heart sounds and intact distal pulses.  Exam reveals no gallop and no friction rub.   No murmur heard. Tachycardic with bpm nearly 200  Pulmonary/Chest: Breath sounds normal. No stridor. She is in respiratory distress. She has no wheezes. She has no rales. She exhibits no tenderness.  Abdominal: Soft. Bowel sounds are normal. She exhibits no distension. There is tenderness (suprapubic). There is no guarding.  Musculoskeletal: Normal range of motion. She exhibits tenderness (diffusely tender to palpation, joints and muscles). She exhibits no edema or deformity.  Neurological: She is alert and oriented to person, place, and time.  Skin: Skin is warm and dry. Capillary refill takes less than 2 seconds. She is not diaphoretic.  Psychiatric: Thought content normal.  Odd affect, rapidly develops new somatic complaints  ED Treatments / Results  Labs (all labs ordered are listed, but only abnormal results are displayed) Labs Reviewed  URINALYSIS, ROUTINE W REFLEX MICROSCOPIC (NOT AT [facility]) - Abnormal; Notable for the following:       Result Value   Specific Gravity, Urine <1.005 (*)    Hgb urine dipstick TRACE (*)    All other components within normal limits  I-STAT CHEM 8, ED - Abnormal; Notable for the following:    Glucose, Bld 105 (*)    Calcium, Ion 1.14 (*)    Hemoglobin 17.3 (*)    HCT 51.0 (*)    All other components within normal limits  URINE MICROSCOPIC-ADD ON  CBC WITH DIFFERENTIAL/PLATELET  BASIC METABOLIC PANEL  TSH  I-STAT TROPOININ, ED    EKG  EKG Interpretation  Date/Time:  Monday March 22 2016 08:46:46 EDT Ventricular Rate:  96 PR Interval:    QRS Duration: 83 QT Interval:  363 QTC Calculation: 459 R Axis:   60 Text Interpretation:  sinus rhythm Atrial premature complexes RSR' in V1 or V2, right VCD or RVH Confirmed by ZAVITZ MD, JOSHUA [phone]([id number]) on 03/22/2016 8:50:50 AM       Radiology Dg Chest Portable 1 View  Result  Date: 03/22/2016 CLINICAL DATA:  Shortness of breath and tachycardia beginning this morning. Chest pain for several months. EXAM: PORTABLE CHEST 1 VIEW COMPARISON:  05/17/2015 FINDINGS: The heart size and mediastinal contours are within normal limits. Aortic atherosclerosis. Both lungs are clear. No evidence of pneumothorax or pleural effusion. Bilateral breast implants noted as well as other overlying leads and monitoring devices. IMPRESSION: No active disease. Electronically Signed   By: Myles RosenthalJohn  Stahl M.D.   On: 03/22/2016 08:34    Procedures Procedures (including critical care time)  Medications Ordered in ED Medications  adenosine (ADENOCARD) 6 MG/2ML injection 6 mg (6 mg Intravenous Given 03/22/16 0757)  morphine 2 MG/ML injection 2 mg (2 mg Intravenous Given 03/22/16 0812)  sodium chloride 0.9 % bolus 500 mL (0 mLs Intravenous Stopped 03/22/16 0844)  ondansetron (ZOFRAN) injection 4 mg (4 mg Intravenous Given 03/22/16 0817)  morphine 4 MG/ML injection 4 mg (4 mg Intravenous Given 03/22/16 0852)     Initial Impression / Assessment and Plan / ED Course  I have reviewed the triage vital signs and the nursing notes.  Pertinent labs & imaging results that were available during my care of the patient were reviewed by me and considered in my medical decision making (see chart for details).  Clinical Course   Ms. Nedra HaiLee has PMH fibromyalgia, back pain, stroke who presents for tachycardia in SVT and severe back pain, along with numerous somatic complaints. SVT resolved with 6mg  adenosine. Patient later revealed that she was holding her urine, as EMS did not give her a chance to use the restroom before transport to the hospital. U/A was benign, no evidence of symptoms of UTI. SVT of unclear etiology, possible due to recently started prednisone. Patient remained stable in NSR for several hours and was discharged home with plan to follow up with cardiology outpatient and to proceed with previous plans to have  lumbar MRI to evaluate her back pain.  Final Clinical Impressions(s) / ED Diagnoses   Final diagnoses:  None    New Prescriptions New Prescriptions   No medications on file     Althia FortsAdam Barbee Mamula, MD 03/23/16 1536    Blane OharaJoshua Zavitz, MD 03/23/16 [phone]

## 2016-03-22 NOTE — ED Notes (Signed)
Pt resting quietly with husband at bedside.  No distress.

## 2016-03-22 NOTE — ED Notes (Signed)
In and out cath with 600 cc urine out .  Clear yellow.  Pt hollaring out with back and neck pain.

## 2016-03-22 NOTE — ED Triage Notes (Signed)
Called EMS for feeling like her heart rate is going fast.  C/o sob this morning.  Heart rate 170 with EMS.   Refused IV  With ems/.

## 2016-03-22 NOTE — ED Notes (Signed)
Pt states she needs to pee.  Pt on bedpan.  hollaring out with neck and back pain.

## 2016-03-23 ENCOUNTER — Emergency Department (HOSPITAL_COMMUNITY)
Admission: EM | Admit: 2016-03-23 | Discharge: 2016-03-23 | Disposition: A | Discharge status: Home / Self Care | Payer: PPO | Attending: Emergency Medicine | Admitting: Emergency Medicine

## 2016-03-23 ENCOUNTER — Encounter (HOSPITAL_COMMUNITY): Payer: Self-pay | Admitting: Emergency Medicine

## 2016-03-23 DIAGNOSIS — Z8673 Personal history of transient ischemic attack (TIA), and cerebral infarction without residual deficits: Secondary | ICD-10-CM | POA: Diagnosis not present

## 2016-03-23 DIAGNOSIS — E039 Hypothyroidism, unspecified: Secondary | ICD-10-CM | POA: Diagnosis not present

## 2016-03-23 DIAGNOSIS — I471 Supraventricular tachycardia: Secondary | ICD-10-CM | POA: Insufficient documentation

## 2016-03-23 DIAGNOSIS — Z7982 Long term (current) use of aspirin: Secondary | ICD-10-CM | POA: Diagnosis not present

## 2016-03-23 DIAGNOSIS — R000 Tachycardia, unspecified: Secondary | ICD-10-CM | POA: Diagnosis not present

## 2016-03-23 DIAGNOSIS — F17210 Nicotine dependence, cigarettes, uncomplicated: Secondary | ICD-10-CM | POA: Diagnosis not present

## 2016-03-23 DIAGNOSIS — R061 Stridor: Secondary | ICD-10-CM | POA: Diagnosis not present

## 2016-03-23 MED ORDER — METOPROLOL TARTRATE 25 MG PO TABS
50.0000 mg | ORAL_TABLET | Freq: Once | ORAL | Status: AC
  Administered 2016-03-23: 50 mg via ORAL
  Filled 2016-03-23: qty 2

## 2016-03-23 MED ORDER — ADENOSINE 6 MG/2ML IV SOLN
INTRAVENOUS | Status: AC
  Filled 2016-03-23: qty 6

## 2016-03-23 MED ORDER — ADENOSINE 6 MG/2ML IV SOLN
6.0000 mg | Freq: Once | INTRAVENOUS | Status: AC
  Administered 2016-03-23: 6 mg via INTRAVENOUS

## 2016-03-23 MED ORDER — VERAPAMIL HCL 2.5 MG/ML IV SOLN
5.0000 mg | Freq: Once | INTRAVENOUS | Status: AC
  Administered 2016-03-23: 5 mg via INTRAVENOUS
  Filled 2016-03-23: qty 2

## 2016-03-23 MED ORDER — CYCLOBENZAPRINE HCL 10 MG PO TABS
10.0000 mg | ORAL_TABLET | Freq: Once | ORAL | Status: AC
  Administered 2016-03-23: 10 mg via ORAL
  Filled 2016-03-23: qty 1

## 2016-03-23 MED ORDER — ADENOSINE 6 MG/2ML IV SOLN
12.0000 mg | Freq: Once | INTRAVENOUS | Status: AC
  Administered 2016-03-23: 12 mg via INTRAVENOUS

## 2016-03-23 MED ORDER — METOPROLOL TARTRATE 25 MG PO TABS: 25.0000 mg | ORAL_TABLET | Freq: Two times a day (BID) | ORAL | 0 refills | Status: DC

## 2016-03-23 NOTE — ED Notes (Addendum)
ED resident at bedside. Attempted vagal maneuvers. Pt placed on zoll pads/monitor.

## 2016-03-23 NOTE — ED Triage Notes (Signed)
Pt arrived via [facility] with SVT. Pt seen yesterday at [facility] for SVT. EMS tx pt with 6mg  adenosine, 2mg  ativan, and 4mg  zofran; pt converted from heart rate of 200 to 90. Pt denies SOB, chest pain, or anxiety at this time.

## 2016-03-23 NOTE — ED Notes (Signed)
Pt noted to be in SVT. MD made aware.

## 2016-03-23 NOTE — ED Provider Notes (Signed)
[facility]-EMER[facility]Y DEPT Provider Note   CSN: [id number] Arrival date & time: 03/23/16  1339     History   Chief Complaint Chief Complaint  Patient presents with  . Tachycardia    HPI [name] is a 70 y.o. female.  Palpitations   This is a new problem. The current episode started 3 to 5 hours ago. The problem has not changed since onset.On average, each episode lasts 1 day. Pertinent negatives include no fever, no chest pain, no chest pressure, no abdominal pain, no nausea, no vomiting, no back pain, no cough and no shortness of breath.    Past Medical History:  Diagnosis Date  . Anxiety   . Back pain   . Fibromyalgia   . Hyperlipemia   . Hypothyroidism   . Neuropathy (HCC)   . Sinusitis   . Stroke [facility])    no deficits    Patient Active Problem List   Diagnosis Date Noted  . Back pain 05/05/2012  . Fibromyalgia 05/05/2012  . Chest pressure 04/03/2012  . Carotid bruit 04/03/2012  . Anxiety 10/05/2011    Past Surgical History:  Procedure Laterality Date  . ABDOMINAL HYSTERECTOMY    . CATARACT EXTRACTION Bilateral   . CHOLECYSTECTOMY    . ESOPHAGEAL DILATION N/A 06/27/2015   Procedure: ESOPHAGEAL DILATION;  Surgeon: Malissa Hippo, MD;  Location: [facility];  Service: Endoscopy;  Laterality: N/A;  . ESOPHAGOGASTRODUODENOSCOPY (EGD) WITH PROPOFOL N/A 06/27/2015   Procedure: ESOPHAGOGASTRODUODENOSCOPY (EGD) WITH PROPOFOL;  Surgeon: Malissa Hippo, MD;  Location: [facility]ITE;  Service: Endoscopy;  Laterality: N/A;    OB History    No data available       Home Medications    Prior to Admission medications   Medication Sig Start Date End Date Taking? Authorizing Provider  ALPRAZolam Prudy Feeler) 0.5 MG tablet Take 0.5 mg by mouth 3 (three) times daily as needed.  03/05/16   Historical Provider, MD  aspirin 325 MG tablet Take 325 mg by mouth daily.    Historical Provider, MD  butalbital-acetaminophen-caffeine (FIORICET, ESGIC) 50-325-40 MG per tablet Take 2  tablets by mouth 3 (three) times daily as needed for headache or migraine.     Historical Provider, MD  cyclobenzaprine (FLEXERIL) 10 MG tablet Take 10 mg by mouth 3 (three) times daily as needed for muscle spasms.  12/19/12   Historical Provider, MD  DULoxetine (CYMBALTA) 30 MG capsule TAKE 1 CAPSULE BY MOUTH ONCE DAILY.TAKE WITH 60MG . 02/13/16   Cleotis Nipper, MD  DULoxetine (CYMBALTA) 60 MG capsule TAKE (1) CAPSULE BY MOUTH EVERY DAY.TAKE WITH 30MG . 02/13/16   Cleotis Nipper, MD  omeprazole (PRILOSEC) 20 MG capsule Take 1 capsule (20 mg total) by mouth 2 (two) times daily before a meal. 09/02/15   Malissa Hippo, MD  Oxycodone HCl 10 MG TABS Take 10 mg by mouth every 4 (four) hours as needed (pain).  02/26/13   Historical Provider, MD  predniSONE (STERAPRED UNI-PAK 21 TAB) 10 MG (21) TBPK tablet Take 10 mg by mouth as directed. Take 6 tablets on day one. Reduce by one tablet each day for a total of 6 days. 03/19/16   Historical Provider, MD  simvastatin (ZOCOR) 40 MG tablet Take 40 mg by mouth daily at 6 PM.  04/29/14   Historical Provider, MD  valACYclovir (VALTREX) 1000 MG tablet Take 1,000 mg by mouth daily as needed (for fever blisters).    Historical Provider, MD    Family History Family  History  Problem Relation Age of Onset  . Depression Mother   . Depression Father   . Depression Sister   . Anxiety disorder Sister   . Anxiety disorder Sister   . Alcohol abuse Neg Hx   . Drug abuse Neg Hx     Social History Social History  Substance Use Topics  . Smoking status: Current Every Day Smoker    Packs/day: 0.25    Years: 50.00    Types: Cigarettes  . Smokeless tobacco: Never Used     Comment: smokes 1/2 pack every 2 day.  . Alcohol use No     Allergies   Codeine and Prednisone   Review of Systems Review of Systems  Constitutional: Negative for chills and fever.  HENT: Negative for ear pain and sore throat.   Eyes: Negative for pain and visual disturbance.  Respiratory:  Negative for cough and shortness of breath.   Cardiovascular: Positive for palpitations. Negative for chest pain.  Gastrointestinal: Negative for abdominal pain, nausea and vomiting.  Genitourinary: Negative for dysuria and hematuria.  Musculoskeletal: Negative for arthralgias and back pain.  Skin: Negative for color change and rash.  Neurological: Negative for seizures and syncope.  All other systems reviewed and are negative.    Physical Exam Updated Vital Signs BP (!) 151/110   Pulse (!) 172   Temp 98.2 F (36.8 C) (Oral)   Resp 13   Ht 5\' 3"  (1.6 m)   Wt 60.3 kg   SpO2 99%   BMI 23.56 kg/m   Physical Exam  Constitutional: She is oriented to person, place, and time. She appears well-developed and well-nourished. No distress.  HENT:  Head: Normocephalic and atraumatic.  Eyes: Conjunctivae and EOM are normal. Pupils are equal, round, and reactive to light.  Neck: Normal range of motion. Neck supple.  Cardiovascular: Regular rhythm.   Pulmonary/Chest: Effort normal and breath sounds normal. No respiratory distress.  Abdominal: Soft. There is no tenderness.  Musculoskeletal: She exhibits no edema.  Neurological: She is alert and oriented to person, place, and time.  Skin: Skin is warm and dry.  Psychiatric: She has a normal mood and affect.  Nursing note and vitals reviewed.    ED Treatments / Results  Labs (all labs ordered are listed, but only abnormal results are displayed) Labs Reviewed - No data to display  EKG  EKG Interpretation  Date/Time:  Tuesday March 23 2016 13:45:58 EDT Ventricular Rate:  94 PR Interval:    QRS Duration: 73 QT Interval:  333 QTC Calculation: 417 R Axis:   48 Text Interpretation:  Sinus tachycardia Ventricular premature complex Abnormal R-wave progression, early transition Borderline T abnormalities, anterior leads No significant change since last tracing Confirmed by FLOYD MD, DANIEL [phone]) on 03/23/2016 3:27:41 PM        Radiology Dg Chest Portable 1 View  Result Date: 03/22/2016 CLINICAL DATA:  Shortness of breath and tachycardia beginning this morning. Chest pain for several months. EXAM: PORTABLE CHEST 1 VIEW COMPARISON:  05/17/2015 FINDINGS: The heart size and mediastinal contours are within normal limits. Aortic atherosclerosis. Both lungs are clear. No evidence of pneumothorax or pleural effusion. Bilateral breast implants noted as well as other overlying leads and monitoring devices. IMPRESSION: No active disease. Electronically Signed   By: Myles Rosenthal M.D.   On: 03/22/2016 08:34    Procedures Procedures (including critical care time)  Medications Ordered in ED Medications  adenosine (ADENOCARD) 6 MG/2ML injection (  Not Given 03/23/16 1504)  adenosine (ADENOCARD) 6 MG/2ML injection 6 mg (6 mg Intravenous Given 03/23/16 1500)  adenosine (ADENOCARD) 6 MG/2ML injection 12 mg (12 mg Intravenous Given 03/23/16 1507)     Initial Impression / Assessment and Plan / ED Course  I have reviewed the triage vital signs and the nursing notes.  Pertinent labs & imaging results that were available during my care of the patient were reviewed by me and considered in my medical decision making (see chart for details).  Clinical Course   Ms. Nedra HaiLee is a 70 year old female with history of anxiety, stroke, fibromyalgia, and chronic back pain who presents for palpitations and shortness of breath. She was found to be in SVT by EMS.  They administered 6mg  adenosine which converted her to NSR.  Patient returned to SVT in the waiting room and was brought back.  EKG demonstrates SVT, doubt afib with rvr. Vagal manouvers were attempted but did not resolve the arrhythmia.  She was given 6mg  adenosine which was unsuccessful. She was then given 12 mg, which was also unsuccessful.  5 mg of verapamil was then given which converted her to NSR.  The patient was then complaining of her chronic back pain and states she has not  taken any of her home medications and requests flexeril.  Flexeril was given with good improvement.  The patient has recently has a workup for these symptoms including CXR, Ua, chem 8, CBC, BMP, TSH, and troponin.  Results were unremarkable.  She has follow up plans for lumbar MRI and outpatient cardiology in place based on visit from yesterday.  Patient was started on metoprolol, and given prescription for the same.  Patient is discharged with strict return precautions, follow up instructions, and educational materials.   Final Clinical Impressions(s) / ED Diagnoses   Final diagnoses:  SVT (supraventricular tachycardia) (HCC)    New Prescriptions Discharge Medication List as of 03/23/2016  4:55 PM    START taking these medications   Details  metoprolol (LOPRESSOR) 25 MG tablet Take 1 tablet (25 mg total) by mouth 2 (two) times daily., Starting Tue 03/23/2016, Print         Garey HamMichael E Jaylynn Siefert, MD 03/24/16 1342    Melene Planan Floyd, DO 03/24/16 1432

## 2016-03-27 DIAGNOSIS — E039 Hypothyroidism, unspecified: Secondary | ICD-10-CM | POA: Diagnosis not present

## 2016-03-27 DIAGNOSIS — I471 Supraventricular tachycardia: Secondary | ICD-10-CM | POA: Diagnosis not present

## 2016-03-31 ENCOUNTER — Ambulatory Visit (HOSPITAL_COMMUNITY): Payer: Self-pay | Admitting: Psychiatry

## 2016-04-01 ENCOUNTER — Ambulatory Visit (INDEPENDENT_AMBULATORY_CARE_PROVIDER_SITE_OTHER): Payer: PPO | Admitting: Psychiatry

## 2016-04-01 ENCOUNTER — Encounter (HOSPITAL_COMMUNITY): Payer: Self-pay | Admitting: Psychiatry

## 2016-04-01 VITALS — BP 140/78 | HR 77 | Ht 63.0 in | Wt 135.6 lb

## 2016-04-01 DIAGNOSIS — F411 Generalized anxiety disorder: Secondary | ICD-10-CM | POA: Diagnosis not present

## 2016-04-01 MED ORDER — ALPRAZOLAM 0.5 MG PO TABS: 0.5000 mg | ORAL_TABLET | Freq: Three times a day (TID) | ORAL | 2 refills | Status: DC | PRN

## 2016-04-01 MED ORDER — SERTRALINE HCL 50 MG PO TABS: 50.0000 mg | ORAL_TABLET | Freq: Every day | ORAL | 2 refills | Status: DC

## 2016-04-01 NOTE — Progress Notes (Signed)
[name] Progress Note  [name] [id number] 70 y.o.  04/01/2016 4:03 PM  Chief Complaint:  I have a reaction with the steroids.  I have severe palpitation and shortness of breath.  I went to the emergency room twice.        History of Present Illness: [name] came with her husband for her appointment.  Patient and her husband mention that lately she is in a lot of stress.  She is been visited twice to the emergency room because of shortness of breath, palpitation and passing out.  She was told that she may have allergic reaction to prednisone .  She was given prednisone for lower back pain.  She admitted feeling more anxious nervous and her primary care physician recommended to discontinue Klonopin and started on Xanax 0.5 mg 3 times a day.  She also stopped taking Cymbalta because she was afraid not to mix with Xanax.  She admitted feeling more anxious and nervous lately but she like to continue Xanax.  She denies any major panic attack.  She admitted stressful situation at home but did not elaborate further.  She is not interested in counseling.  She sleeping on and off and sometime she is very isolated withdrawn and does not want to leave the house.  However she denies any active or passive suicidal thoughts or homicidal thought.  Her energy level is fair.  She denies any paranoia, hallucination, suicidal thoughts or homicidal thought.  Her appetite is okay.  Her vital signs are stable.  She is no longer taking gabapentin however she is taking narcotic pain medication for back pain.  Patient denies drinking alcohol or using any illegal substances.   She lives with her husband is very supportive.    Suicidal Ideation: No Plan Formed: No Patient has means to carry out plan: No  Homicidal Ideation: No Plan Formed: No Patient has means to carry out plan: No  Review of Systems  Constitutional: Negative for weight loss.  HENT: Negative.   Cardiovascular: Negative.    Musculoskeletal: Positive for back pain.  Skin: Negative.   Neurological: Negative for dizziness, tingling and tremors.  Psychiatric/Behavioral: Negative for suicidal ideas.   Psychiatric: Agitation: No Hallucination: No Depressed Mood: Yes Insomnia: No Hypersomnia: No Altered Concentration: No Feels Worthless: No Grandiose Ideas: No Belief In Special Powers: No New/Increased Substance Abuse: No Compulsions: No  Neurologic: Headache: No Seizure: No Paresthesias: No   Outpatient Encounter Prescriptions as of 04/01/2016  Medication Sig Dispense Refill  . ALPRAZolam (XANAX) 0.5 MG tablet Take 1 tablet (0.5 mg total) by mouth 3 (three) times daily as needed. 90 tablet 2  . aspirin 325 MG tablet Take 325 mg by mouth daily.    . butalbital-acetaminophen-caffeine (FIORICET, ESGIC) 50-325-40 MG per tablet Take 2 tablets by mouth 3 (three) times daily as needed for headache or migraine.     . cyclobenzaprine (FLEXERIL) 10 MG tablet Take 10 mg by mouth 3 (three) times daily as needed for muscle spasms.     Marland Kitchen levothyroxine (SYNTHROID, LEVOTHROID) 25 MCG tablet     . meclizine (ANTIVERT) 25 MG tablet     . metoprolol (LOPRESSOR) 25 MG tablet Take 1 tablet (25 mg total) by mouth 2 (two) times daily. 30 tablet 0  . omeprazole (PRILOSEC) 20 MG capsule Take 1 capsule (20 mg total) by mouth 2 (two) times daily before a meal. 180 capsule 2  . Oxycodone HCl 10 MG TABS Take 10 mg by mouth  every 4 (four) hours as needed (pain).     Marland Kitchen sertraline (ZOLOFT) 50 MG tablet Take 1 tablet (50 mg total) by mouth daily. 30 tablet 2  . simvastatin (ZOCOR) 40 MG tablet Take 40 mg by mouth daily at 6 PM.   4  . valACYclovir (VALTREX) 1000 MG tablet Take 1,000 mg by mouth daily as needed (for fever blisters).    . [DISCONTINUED] ALPRAZolam (XANAX) 0.5 MG tablet Take 0.5 mg by mouth 3 (three) times daily as needed.     . [DISCONTINUED] DULoxetine (CYMBALTA) 30 MG capsule TAKE 1 CAPSULE BY MOUTH ONCE DAILY.TAKE  WITH 60MG. (Patient not taking: Reported on 03/23/2016) 30 capsule 1  . [DISCONTINUED] DULoxetine (CYMBALTA) 60 MG capsule TAKE (1) CAPSULE BY MOUTH EVERY DAY.TAKE WITH 30MG. (Patient not taking: Reported on 03/23/2016) 30 capsule 1  . [DISCONTINUED] predniSONE (STERAPRED UNI-PAK 21 TAB) 10 MG (21) TBPK tablet Take 10 mg by mouth as directed. Take 6 tablets on day one. Reduce by one tablet each day for a total of 6 days.     No facility-administered encounter medications on file as of 04/01/2016.    Recent Results (from the past 2160 hour(s))  I-stat chem 8, ed     Status: Abnormal   Collection Time: 03/22/16  7:59 AM  Result Value Ref Range   Sodium 145 135 - 145 mmol/L   Potassium 3.6 3.5 - 5.1 mmol/L   Chloride 106 101 - 111 mmol/L   BUN 14 6 - 20 mg/dL   Creatinine, Ser 0.80 0.44 - 1.00 mg/dL   Glucose, Bld 105 (H) 65 - 99 mg/dL   Calcium, Ion 1.14 (L) 1.15 - 1.40 mmol/L   TCO2 23 0 - 100 mmol/L   Hemoglobin 17.3 (H) 12.0 - 15.0 g/dL   HCT 51.0 (H) 36.0 - 46.0 %  I-stat troponin, ED     Status: None   Collection Time: 03/22/16  8:01 AM  Result Value Ref Range   Troponin i, poc 0.01 0.00 - 0.08 ng/mL   Comment 3            Comment: Due to the release kinetics of cTnI, a negative result within the first hours of the onset of symptoms does not rule out myocardial infarction with certainty. If myocardial infarction is still suspected, repeat the test at appropriate intervals.   Urinalysis, Routine w reflex microscopic (not at [facility])     Status: Abnormal   Collection Time: 03/22/16  8:10 AM  Result Value Ref Range   Color, Urine YELLOW YELLOW   APPearance CLEAR CLEAR   Specific Gravity, Urine <1.005 (L) 1.005 - 1.030   pH 7.0 5.0 - 8.0   Glucose, UA NEGATIVE NEGATIVE mg/dL   Hgb urine dipstick TRACE (A) NEGATIVE   Bilirubin Urine NEGATIVE NEGATIVE   Ketones, ur NEGATIVE NEGATIVE mg/dL   Protein, ur NEGATIVE NEGATIVE mg/dL   Nitrite NEGATIVE NEGATIVE   Leukocytes, UA  NEGATIVE NEGATIVE  Urine microscopic-add on     Status: None   Collection Time: 03/22/16  8:10 AM  Result Value Ref Range   Squamous Epithelial / LPF NONE SEEN NONE SEEN   WBC, UA NONE SEEN 0 - 5 WBC/hpf   RBC / HPF 0-5 0 - 5 RBC/hpf   Bacteria, UA NONE SEEN NONE SEEN  CBC with Differential/Platelet     Status: Abnormal   Collection Time: 03/22/16  8:39 AM  Result Value Ref Range   WBC 14.8 (H) 4.0 - 10.5 K/uL  RBC 5.00 3.87 - 5.11 MIL/uL   Hemoglobin 16.3 (H) 12.0 - 15.0 g/dL   HCT 47.6 (H) 36.0 - 46.0 %   MCV 95.2 78.0 - 100.0 fL   MCH 32.6 26.0 - 34.0 pg   MCHC 34.2 30.0 - 36.0 g/dL   RDW 14.4 11.5 - 15.5 %   Platelets 344 150 - 400 K/uL   Neutrophils Relative % 50 %   Neutro Abs 7.5 1.7 - 7.7 K/uL   Lymphocytes Relative 39 %   Lymphs Abs 5.7 (H) 0.7 - 4.0 K/uL   Monocytes Relative 10 %   Monocytes Absolute 1.4 (H) 0.1 - 1.0 K/uL   Eosinophils Relative 1 %   Eosinophils Absolute 0.1 0.0 - 0.7 K/uL   Basophils Relative 0 %   Basophils Absolute 0.1 0.0 - 0.1 K/uL  Basic metabolic panel     Status: Abnormal   Collection Time: 03/22/16  8:39 AM  Result Value Ref Range   Sodium 141 135 - 145 mmol/L   Potassium 3.4 (L) 3.5 - 5.1 mmol/L   Chloride 103 101 - 111 mmol/L   CO2 23 22 - 32 mmol/L   Glucose, Bld 98 65 - 99 mg/dL   BUN 15 6 - 20 mg/dL   Creatinine, Ser 0.89 0.44 - 1.00 mg/dL   Calcium 9.9 8.9 - 10.3 mg/dL   GFR calc non Af Amer >60 >60 mL/min   GFR calc Af Amer >60 >60 mL/min    Comment: (NOTE) The eGFR has been calculated using the CKD EPI equation. This calculation has not been validated in all clinical situations. eGFR's persistently <60 mL/min signify possible Chronic Kidney Disease.    Anion gap 15 5 - 15  TSH     Status: Abnormal   Collection Time: 03/22/16  8:39 AM  Result Value Ref Range   TSH 7.027 (H) 0.350 - 4.500 uIU/mL    Comment: Performed by a 3rd Generation assay with a functional sensitivity of <=0.01 uIU/mL.   Past Psychiatric  History/Hospitalization(s): Patient has a long history of anxiety disorder.  She had tried Effexor, Zoloft, Valium, Klonopin and BuSpar.    Bipolar Disorder: No Depression: Yes Mania: No Psychosis: No Schizophrenia: No Personality Disorder: No Hospitalization for psychiatric illness: No History of Electroconvulsive Shock Therapy: No Prior Suicide Attempts: No   Past Medical Family, Social History:  Patient has history of hyperlipidemia, sinusitis, hypothyroidism, seasonal allergies and chronic pain.  She is seeing pain management for her chronic back pain.  She's unemployed.  She lives with her husband who is very involved in her treatment.  Patient has a daughter and she is very attached with her daughter.  Her primary care physician is Dr. Nevada Crane   Physical Exam: Constitutional:  BP 140/78   Pulse 77   Ht '5\' 3"'  (1.6 m)   Wt 135 lb 9.6 oz (61.5 kg)   BMI 24.02 kg/m   General Appearance: alert, oriented, no acute distress and well nourished  Musculoskeletal: Strength & Muscle Tone: Complain of back pain Gait & Station: normal Patient leans: N/A  Mental status examination: Patient is casually dressed and groomed.  She is anxious but cooperative.  She maintained fair eye contact.  She described her mood nervous and sad and her affect is constricted.  Her attention and concentration is fair.  Her speech is slow but clear and coherent.  She denies any auditory or visual hallucination.  She denies any active or passive suicidal thoughts or homicidal thoughts.  There  were no flight of ideas or any loose association. Her psychomotor activity is slow.  Her attention and concentration is fair.  Her fund of knowledge is good.  She is alert and oriented 3.  She has no tremors, shakes or any EPS.  Her insight judgment and impulse control is okay.  Established Problem, Stable/Improving (1), Review of Psycho-Social Stressors (1), Review or order clinical lab tests (1), Review and summation of old  records (2), Established Problem, Worsening (2), Review of Last Therapy Session (1), Review of Medication Regimen & Side Effects (2) and Review of New Medication or Change in Dosage (2)  Assessment: Axis I: Generalized anxiety disorder.  Major depressive disorder, recurrent  Axis II: Deferred  Axis III:  Patient Active Problem List   Diagnosis Date Noted  . Back pain 05/05/2012  . Fibromyalgia 05/05/2012  . Chest pressure 04/03/2012  . Carotid bruit 04/03/2012  . Anxiety 10/05/2011    Plan: I reviewed records from emergency room including recent blood work results and current medication.  Patient is no longer taking Klonopin , Cymbalta and Neurontin.  She is taking Xanax 0.5 mg 3 times a day.  She still have residual anxiety and depression.  I recommended to try Zoloft which helped her in the past.  We will start Zoloft 50 mg daily and she will continue Xanax 0.5 mg 3 times a day.  Discussed benzodiazepine dependence, tolerance and withdrawal.  One more time I offered counseling but patient declined. Recommended to call us back if she has any question.  Follow-up in 3 months. Discuss safety plan that anytime having active suicidal thoughts or homicidal thoughts than she need to call 911.    Vickie Occhipinti T., MD 04/01/2016                            Patient ID: [name], female   DOB: [birth year], 70 y.o.   MRN: [id number]

## 2016-04-05 ENCOUNTER — Other Ambulatory Visit (HOSPITAL_COMMUNITY): Payer: Self-pay | Admitting: Internal Medicine

## 2016-04-05 DIAGNOSIS — M5106 Intervertebral disc disorders with myelopathy, lumbar region: Secondary | ICD-10-CM

## 2016-04-06 ENCOUNTER — Ambulatory Visit (INDEPENDENT_AMBULATORY_CARE_PROVIDER_SITE_OTHER): Payer: Self-pay | Admitting: Internal Medicine

## 2016-04-09 ENCOUNTER — Ambulatory Visit (HOSPITAL_COMMUNITY)
Admission: RE | Admit: 2016-04-09 | Discharge: 2016-04-09 | Disposition: A | Discharge status: Home / Self Care | Payer: PPO | Source: Ambulatory Visit | Attending: Internal Medicine | Admitting: Internal Medicine

## 2016-04-09 DIAGNOSIS — M5106 Intervertebral disc disorders with myelopathy, lumbar region: Secondary | ICD-10-CM | POA: Insufficient documentation

## 2016-04-09 DIAGNOSIS — M545 Low back pain: Secondary | ICD-10-CM | POA: Diagnosis not present

## 2016-04-12 DIAGNOSIS — F331 Major depressive disorder, recurrent, moderate: Secondary | ICD-10-CM | POA: Diagnosis not present

## 2016-04-12 DIAGNOSIS — M5126 Other intervertebral disc displacement, lumbar region: Secondary | ICD-10-CM | POA: Diagnosis not present

## 2016-04-28 DIAGNOSIS — R1084 Generalized abdominal pain: Secondary | ICD-10-CM | POA: Diagnosis not present

## 2016-04-28 DIAGNOSIS — Z6822 Body mass index (BMI) 22.0-22.9, adult: Secondary | ICD-10-CM | POA: Diagnosis not present

## 2016-04-28 DIAGNOSIS — F410 Panic disorder [episodic paroxysmal anxiety] without agoraphobia: Secondary | ICD-10-CM | POA: Diagnosis not present

## 2016-04-28 DIAGNOSIS — F331 Major depressive disorder, recurrent, moderate: Secondary | ICD-10-CM | POA: Diagnosis not present

## 2016-04-29 ENCOUNTER — Other Ambulatory Visit (HOSPITAL_COMMUNITY): Payer: Self-pay | Admitting: Internal Medicine

## 2016-04-29 DIAGNOSIS — R599 Enlarged lymph nodes, unspecified: Secondary | ICD-10-CM

## 2016-04-29 DIAGNOSIS — R109 Unspecified abdominal pain: Secondary | ICD-10-CM

## 2016-05-20 ENCOUNTER — Ambulatory Visit (HOSPITAL_COMMUNITY)
Admission: RE | Admit: 2016-05-20 | Discharge: 2016-05-20 | Disposition: A | Discharge status: Home / Self Care | Payer: PPO | Source: Ambulatory Visit | Attending: Internal Medicine | Admitting: Internal Medicine

## 2016-05-20 DIAGNOSIS — K838 Other specified diseases of biliary tract: Secondary | ICD-10-CM | POA: Diagnosis not present

## 2016-05-20 DIAGNOSIS — R109 Unspecified abdominal pain: Secondary | ICD-10-CM | POA: Insufficient documentation

## 2016-05-20 DIAGNOSIS — N281 Cyst of kidney, acquired: Secondary | ICD-10-CM | POA: Diagnosis not present

## 2016-05-20 DIAGNOSIS — R599 Enlarged lymph nodes, unspecified: Secondary | ICD-10-CM | POA: Insufficient documentation

## 2016-05-20 DIAGNOSIS — Z9049 Acquired absence of other specified parts of digestive tract: Secondary | ICD-10-CM | POA: Diagnosis not present

## 2016-05-28 DIAGNOSIS — G894 Chronic pain syndrome: Secondary | ICD-10-CM | POA: Diagnosis not present

## 2016-06-02 DIAGNOSIS — Z0000 Encounter for general adult medical examination without abnormal findings: Secondary | ICD-10-CM | POA: Diagnosis not present

## 2016-07-05 DIAGNOSIS — G894 Chronic pain syndrome: Secondary | ICD-10-CM | POA: Diagnosis not present

## 2016-07-05 DIAGNOSIS — M47816 Spondylosis without myelopathy or radiculopathy, lumbar region: Secondary | ICD-10-CM | POA: Diagnosis not present

## 2016-07-06 ENCOUNTER — Ambulatory Visit (HOSPITAL_COMMUNITY): Payer: Self-pay | Admitting: Psychiatry

## 2016-07-28 DIAGNOSIS — M47816 Spondylosis without myelopathy or radiculopathy, lumbar region: Secondary | ICD-10-CM | POA: Diagnosis not present

## 2016-08-13 DIAGNOSIS — M545 Low back pain: Secondary | ICD-10-CM | POA: Diagnosis not present

## 2016-08-26 DIAGNOSIS — H26491 Other secondary cataract, right eye: Secondary | ICD-10-CM | POA: Diagnosis not present

## 2016-08-26 DIAGNOSIS — H35371 Puckering of macula, right eye: Secondary | ICD-10-CM | POA: Diagnosis not present

## 2016-08-31 DIAGNOSIS — E782 Mixed hyperlipidemia: Secondary | ICD-10-CM | POA: Diagnosis not present

## 2016-08-31 DIAGNOSIS — R7301 Impaired fasting glucose: Secondary | ICD-10-CM | POA: Diagnosis not present

## 2016-08-31 DIAGNOSIS — E039 Hypothyroidism, unspecified: Secondary | ICD-10-CM | POA: Diagnosis not present

## 2016-08-31 DIAGNOSIS — Z1159 Encounter for screening for other viral diseases: Secondary | ICD-10-CM | POA: Diagnosis not present

## 2016-09-03 DIAGNOSIS — H26491 Other secondary cataract, right eye: Secondary | ICD-10-CM | POA: Diagnosis not present

## 2016-09-04 DIAGNOSIS — F331 Major depressive disorder, recurrent, moderate: Secondary | ICD-10-CM | POA: Diagnosis not present

## 2016-09-04 DIAGNOSIS — G43009 Migraine without aura, not intractable, without status migrainosus: Secondary | ICD-10-CM | POA: Diagnosis not present

## 2016-09-04 DIAGNOSIS — F410 Panic disorder [episodic paroxysmal anxiety] without agoraphobia: Secondary | ICD-10-CM | POA: Diagnosis not present

## 2016-09-04 DIAGNOSIS — M545 Low back pain: Secondary | ICD-10-CM | POA: Diagnosis not present

## 2016-09-14 DIAGNOSIS — Z6822 Body mass index (BMI) 22.0-22.9, adult: Secondary | ICD-10-CM | POA: Diagnosis not present

## 2016-09-14 DIAGNOSIS — J0100 Acute maxillary sinusitis, unspecified: Secondary | ICD-10-CM | POA: Diagnosis not present

## 2016-10-05 DIAGNOSIS — M1711 Unilateral primary osteoarthritis, right knee: Secondary | ICD-10-CM | POA: Diagnosis not present

## 2016-10-05 DIAGNOSIS — M1712 Unilateral primary osteoarthritis, left knee: Secondary | ICD-10-CM | POA: Diagnosis not present

## 2016-10-05 DIAGNOSIS — M542 Cervicalgia: Secondary | ICD-10-CM | POA: Diagnosis not present

## 2016-10-05 DIAGNOSIS — M170 Bilateral primary osteoarthritis of knee: Secondary | ICD-10-CM | POA: Diagnosis not present

## 2016-10-28 DIAGNOSIS — F331 Major depressive disorder, recurrent, moderate: Secondary | ICD-10-CM | POA: Diagnosis not present

## 2016-10-28 DIAGNOSIS — R479 Unspecified speech disturbances: Secondary | ICD-10-CM | POA: Diagnosis not present

## 2016-10-28 DIAGNOSIS — R42 Dizziness and giddiness: Secondary | ICD-10-CM | POA: Diagnosis not present

## 2016-10-28 DIAGNOSIS — H8110 Benign paroxysmal vertigo, unspecified ear: Secondary | ICD-10-CM | POA: Diagnosis not present

## 2016-10-28 DIAGNOSIS — R2689 Other abnormalities of gait and mobility: Secondary | ICD-10-CM | POA: Diagnosis not present

## 2016-10-28 DIAGNOSIS — G3184 Mild cognitive impairment, so stated: Secondary | ICD-10-CM | POA: Diagnosis not present

## 2016-10-28 DIAGNOSIS — M545 Low back pain: Secondary | ICD-10-CM | POA: Diagnosis not present

## 2016-10-28 DIAGNOSIS — H9190 Unspecified hearing loss, unspecified ear: Secondary | ICD-10-CM | POA: Diagnosis not present

## 2016-10-28 DIAGNOSIS — G894 Chronic pain syndrome: Secondary | ICD-10-CM | POA: Diagnosis not present

## 2016-10-28 DIAGNOSIS — M25569 Pain in unspecified knee: Secondary | ICD-10-CM | POA: Diagnosis not present

## 2016-10-29 DIAGNOSIS — M5033 Other cervical disc degeneration, cervicothoracic region: Secondary | ICD-10-CM | POA: Diagnosis not present

## 2016-10-29 DIAGNOSIS — M5412 Radiculopathy, cervical region: Secondary | ICD-10-CM | POA: Diagnosis not present

## 2016-10-29 DIAGNOSIS — M5010 Cervical disc disorder with radiculopathy, unspecified cervical region: Secondary | ICD-10-CM | POA: Diagnosis not present

## 2016-10-29 DIAGNOSIS — M542 Cervicalgia: Secondary | ICD-10-CM | POA: Diagnosis not present

## 2016-11-09 ENCOUNTER — Other Ambulatory Visit (HOSPITAL_COMMUNITY): Payer: Self-pay | Admitting: Internal Medicine

## 2016-11-09 DIAGNOSIS — Z8673 Personal history of transient ischemic attack (TIA), and cerebral infarction without residual deficits: Secondary | ICD-10-CM

## 2016-11-09 DIAGNOSIS — R4182 Altered mental status, unspecified: Secondary | ICD-10-CM

## 2016-11-09 DIAGNOSIS — R413 Other amnesia: Secondary | ICD-10-CM

## 2016-11-09 DIAGNOSIS — R269 Unspecified abnormalities of gait and mobility: Secondary | ICD-10-CM

## 2016-11-09 DIAGNOSIS — R4781 Slurred speech: Secondary | ICD-10-CM

## 2016-11-12 DIAGNOSIS — M542 Cervicalgia: Secondary | ICD-10-CM | POA: Diagnosis not present

## 2016-11-12 DIAGNOSIS — G894 Chronic pain syndrome: Secondary | ICD-10-CM | POA: Diagnosis not present

## 2016-11-12 DIAGNOSIS — M47816 Spondylosis without myelopathy or radiculopathy, lumbar region: Secondary | ICD-10-CM | POA: Diagnosis not present

## 2016-11-15 ENCOUNTER — Ambulatory Visit (HOSPITAL_COMMUNITY): Payer: PPO

## 2016-11-22 ENCOUNTER — Ambulatory Visit (HOSPITAL_COMMUNITY): Payer: PPO

## 2016-11-26 ENCOUNTER — Ambulatory Visit (HOSPITAL_COMMUNITY): Payer: PPO

## 2016-12-10 DIAGNOSIS — M47816 Spondylosis without myelopathy or radiculopathy, lumbar region: Secondary | ICD-10-CM | POA: Diagnosis not present

## 2016-12-10 DIAGNOSIS — M5136 Other intervertebral disc degeneration, lumbar region: Secondary | ICD-10-CM | POA: Diagnosis not present

## 2017-01-03 ENCOUNTER — Other Ambulatory Visit (INDEPENDENT_AMBULATORY_CARE_PROVIDER_SITE_OTHER): Payer: Self-pay | Admitting: Internal Medicine

## 2017-01-03 DIAGNOSIS — K279 Peptic ulcer, site unspecified, unspecified as acute or chronic, without hemorrhage or perforation: Secondary | ICD-10-CM

## 2017-01-03 NOTE — Telephone Encounter (Signed)
Patient will need to have OV prior to further refills. 

## 2017-01-18 DIAGNOSIS — M542 Cervicalgia: Secondary | ICD-10-CM | POA: Diagnosis not present

## 2017-01-18 DIAGNOSIS — M47816 Spondylosis without myelopathy or radiculopathy, lumbar region: Secondary | ICD-10-CM | POA: Diagnosis not present

## 2017-01-18 DIAGNOSIS — G894 Chronic pain syndrome: Secondary | ICD-10-CM | POA: Diagnosis not present

## 2017-01-22 DIAGNOSIS — R2689 Other abnormalities of gait and mobility: Secondary | ICD-10-CM | POA: Diagnosis not present

## 2017-01-22 DIAGNOSIS — M545 Low back pain: Secondary | ICD-10-CM | POA: Diagnosis not present

## 2017-01-22 DIAGNOSIS — R4182 Altered mental status, unspecified: Secondary | ICD-10-CM | POA: Diagnosis not present

## 2017-01-22 DIAGNOSIS — I890 Lymphedema, not elsewhere classified: Secondary | ICD-10-CM | POA: Diagnosis not present

## 2017-01-22 DIAGNOSIS — F410 Panic disorder [episodic paroxysmal anxiety] without agoraphobia: Secondary | ICD-10-CM | POA: Diagnosis not present

## 2017-01-22 DIAGNOSIS — G894 Chronic pain syndrome: Secondary | ICD-10-CM | POA: Diagnosis not present

## 2017-01-22 DIAGNOSIS — R42 Dizziness and giddiness: Secondary | ICD-10-CM | POA: Diagnosis not present

## 2017-01-22 DIAGNOSIS — F331 Major depressive disorder, recurrent, moderate: Secondary | ICD-10-CM | POA: Diagnosis not present

## 2017-01-24 ENCOUNTER — Other Ambulatory Visit: Payer: Self-pay | Admitting: Internal Medicine

## 2017-01-24 DIAGNOSIS — R4182 Altered mental status, unspecified: Secondary | ICD-10-CM

## 2017-01-26 ENCOUNTER — Ambulatory Visit
Admission: RE | Admit: 2017-01-26 | Discharge: 2017-01-26 | Disposition: A | Discharge status: Home / Self Care | Payer: PPO | Source: Ambulatory Visit | Attending: Internal Medicine | Admitting: Internal Medicine

## 2017-01-26 DIAGNOSIS — R4182 Altered mental status, unspecified: Secondary | ICD-10-CM | POA: Diagnosis not present

## 2017-02-02 DIAGNOSIS — E039 Hypothyroidism, unspecified: Secondary | ICD-10-CM | POA: Diagnosis not present

## 2017-02-02 DIAGNOSIS — E782 Mixed hyperlipidemia: Secondary | ICD-10-CM | POA: Diagnosis not present

## 2017-02-02 DIAGNOSIS — D519 Vitamin B12 deficiency anemia, unspecified: Secondary | ICD-10-CM | POA: Diagnosis not present

## 2017-02-02 DIAGNOSIS — E559 Vitamin D deficiency, unspecified: Secondary | ICD-10-CM | POA: Diagnosis not present

## 2017-02-02 DIAGNOSIS — R7301 Impaired fasting glucose: Secondary | ICD-10-CM | POA: Diagnosis not present

## 2017-02-03 DIAGNOSIS — E782 Mixed hyperlipidemia: Secondary | ICD-10-CM | POA: Diagnosis not present

## 2017-02-03 DIAGNOSIS — G43911 Migraine, unspecified, intractable, with status migrainosus: Secondary | ICD-10-CM | POA: Diagnosis not present

## 2017-02-03 DIAGNOSIS — G894 Chronic pain syndrome: Secondary | ICD-10-CM | POA: Diagnosis not present

## 2017-02-03 DIAGNOSIS — Z6824 Body mass index (BMI) 24.0-24.9, adult: Secondary | ICD-10-CM | POA: Diagnosis not present

## 2017-02-03 DIAGNOSIS — F410 Panic disorder [episodic paroxysmal anxiety] without agoraphobia: Secondary | ICD-10-CM | POA: Diagnosis not present

## 2017-02-03 DIAGNOSIS — R7301 Impaired fasting glucose: Secondary | ICD-10-CM | POA: Diagnosis not present

## 2017-02-03 DIAGNOSIS — F331 Major depressive disorder, recurrent, moderate: Secondary | ICD-10-CM | POA: Diagnosis not present

## 2017-02-03 DIAGNOSIS — M545 Low back pain: Secondary | ICD-10-CM | POA: Diagnosis not present

## 2017-02-10 DIAGNOSIS — M170 Bilateral primary osteoarthritis of knee: Secondary | ICD-10-CM | POA: Diagnosis not present

## 2017-02-10 DIAGNOSIS — M1712 Unilateral primary osteoarthritis, left knee: Secondary | ICD-10-CM | POA: Diagnosis not present

## 2017-02-10 DIAGNOSIS — M1711 Unilateral primary osteoarthritis, right knee: Secondary | ICD-10-CM | POA: Diagnosis not present

## 2017-02-21 DIAGNOSIS — E039 Hypothyroidism, unspecified: Secondary | ICD-10-CM | POA: Diagnosis not present

## 2017-02-21 DIAGNOSIS — Z6823 Body mass index (BMI) 23.0-23.9, adult: Secondary | ICD-10-CM | POA: Diagnosis not present

## 2017-03-09 DIAGNOSIS — G894 Chronic pain syndrome: Secondary | ICD-10-CM | POA: Diagnosis not present

## 2017-03-09 DIAGNOSIS — M542 Cervicalgia: Secondary | ICD-10-CM | POA: Diagnosis not present

## 2017-03-09 DIAGNOSIS — M47816 Spondylosis without myelopathy or radiculopathy, lumbar region: Secondary | ICD-10-CM | POA: Diagnosis not present

## 2017-03-28 DIAGNOSIS — E039 Hypothyroidism, unspecified: Secondary | ICD-10-CM | POA: Diagnosis not present

## 2017-03-30 DIAGNOSIS — E039 Hypothyroidism, unspecified: Secondary | ICD-10-CM | POA: Diagnosis not present

## 2017-03-30 DIAGNOSIS — M545 Low back pain: Secondary | ICD-10-CM | POA: Diagnosis not present

## 2017-05-17 DIAGNOSIS — F332 Major depressive disorder, recurrent severe without psychotic features: Secondary | ICD-10-CM | POA: Diagnosis not present

## 2017-07-11 DIAGNOSIS — H903 Sensorineural hearing loss, bilateral: Secondary | ICD-10-CM | POA: Diagnosis not present

## 2017-07-27 DIAGNOSIS — E782 Mixed hyperlipidemia: Secondary | ICD-10-CM | POA: Diagnosis not present

## 2017-07-27 DIAGNOSIS — R7301 Impaired fasting glucose: Secondary | ICD-10-CM | POA: Diagnosis not present

## 2017-07-27 DIAGNOSIS — F332 Major depressive disorder, recurrent severe without psychotic features: Secondary | ICD-10-CM | POA: Diagnosis not present

## 2017-07-27 DIAGNOSIS — E039 Hypothyroidism, unspecified: Secondary | ICD-10-CM | POA: Diagnosis not present

## 2017-07-29 DIAGNOSIS — F331 Major depressive disorder, recurrent, moderate: Secondary | ICD-10-CM | POA: Diagnosis not present

## 2017-07-29 DIAGNOSIS — G8929 Other chronic pain: Secondary | ICD-10-CM | POA: Diagnosis not present

## 2017-07-29 DIAGNOSIS — E039 Hypothyroidism, unspecified: Secondary | ICD-10-CM | POA: Diagnosis not present

## 2017-07-29 DIAGNOSIS — R7301 Impaired fasting glucose: Secondary | ICD-10-CM | POA: Diagnosis not present

## 2017-07-29 DIAGNOSIS — E785 Hyperlipidemia, unspecified: Secondary | ICD-10-CM | POA: Diagnosis not present

## 2017-07-29 DIAGNOSIS — R42 Dizziness and giddiness: Secondary | ICD-10-CM | POA: Diagnosis not present

## 2017-07-29 DIAGNOSIS — G43019 Migraine without aura, intractable, without status migrainosus: Secondary | ICD-10-CM | POA: Diagnosis not present

## 2017-08-19 DIAGNOSIS — M170 Bilateral primary osteoarthritis of knee: Secondary | ICD-10-CM | POA: Diagnosis not present

## 2017-09-07 DIAGNOSIS — M1712 Unilateral primary osteoarthritis, left knee: Secondary | ICD-10-CM | POA: Diagnosis not present

## 2017-09-07 DIAGNOSIS — M170 Bilateral primary osteoarthritis of knee: Secondary | ICD-10-CM | POA: Diagnosis not present

## 2017-09-07 DIAGNOSIS — M1711 Unilateral primary osteoarthritis, right knee: Secondary | ICD-10-CM | POA: Diagnosis not present

## 2017-09-09 ENCOUNTER — Emergency Department (HOSPITAL_COMMUNITY): Admission: EM | Admit: 2017-09-09 | Discharge: 2017-09-09 | Discharge status: Against Medical Advice | Payer: PPO

## 2017-09-09 NOTE — ED Notes (Signed)
Pt called no response

## 2017-09-09 NOTE — ED Notes (Signed)
Called No answer.

## 2017-09-19 ENCOUNTER — Other Ambulatory Visit: Payer: Self-pay

## 2017-09-19 NOTE — Patient Outreach (Signed)
[facility] Care Management  09/19/2017  [name] [birth year] [id number]   Referral Date: 09/16/17 Referral Source: [facility] Referral Reason: "Husband states that member is on many medications not state medications on call, but states that he stays on top of her medications and she takes them as prescribed"   Outreach Attempt: #1 Spoke with spouse Starr Lakernest Damron.  He is able to verify HIPAA.  He states that patient is doing fine and explained they did not stay at the ER as patient started feeling better before being seen.  Discussed reason for referral.  He denies any problems with medication management.     Social: Patient lives in the home with her husband who is her primary caregiver. He states that patient is independent with ADL's.   Conditions: Patient has some problems with anxiety and has had previous stroke but patient is able to walk with the walker.  He also reports some arthritis to knees and that patient to receive shots in her knees.     Medications: Husband denies any problems with medications and that he makes sure she takes her medications.   Appointments:  Patient has no appointments presently but waiting for appointment with orthopedic for injections in her knees.    Advanced Directives: Patient does not have an advanced directive or living will.   Consent: [facility] but husband declines at this time.     Plan: RN CM will send letter and brochure. RN CM will close case at this time.   Bary Lericheionne J Tatanisha Cuthbert, RN, MSN [facility] Care Management Care Management Coordinator Direct Line [phone] Toll Free: [phone]  Fax: [phone]

## 2017-10-11 DIAGNOSIS — R7301 Impaired fasting glucose: Secondary | ICD-10-CM | POA: Diagnosis not present

## 2017-10-11 DIAGNOSIS — E039 Hypothyroidism, unspecified: Secondary | ICD-10-CM | POA: Diagnosis not present

## 2017-10-11 DIAGNOSIS — G8929 Other chronic pain: Secondary | ICD-10-CM | POA: Diagnosis not present

## 2017-10-11 DIAGNOSIS — G43019 Migraine without aura, intractable, without status migrainosus: Secondary | ICD-10-CM | POA: Diagnosis not present

## 2017-10-11 DIAGNOSIS — Z6823 Body mass index (BMI) 23.0-23.9, adult: Secondary | ICD-10-CM | POA: Diagnosis not present

## 2017-10-11 DIAGNOSIS — F331 Major depressive disorder, recurrent, moderate: Secondary | ICD-10-CM | POA: Diagnosis not present

## 2017-10-11 DIAGNOSIS — E785 Hyperlipidemia, unspecified: Secondary | ICD-10-CM | POA: Diagnosis not present

## 2017-10-11 DIAGNOSIS — R42 Dizziness and giddiness: Secondary | ICD-10-CM | POA: Diagnosis not present

## 2017-10-11 DIAGNOSIS — R300 Dysuria: Secondary | ICD-10-CM | POA: Diagnosis not present

## 2017-10-12 DIAGNOSIS — M170 Bilateral primary osteoarthritis of knee: Secondary | ICD-10-CM | POA: Diagnosis not present

## 2017-10-19 DIAGNOSIS — M1711 Unilateral primary osteoarthritis, right knee: Secondary | ICD-10-CM | POA: Diagnosis not present

## 2017-10-19 DIAGNOSIS — M1712 Unilateral primary osteoarthritis, left knee: Secondary | ICD-10-CM | POA: Diagnosis not present

## 2017-10-26 DIAGNOSIS — M1712 Unilateral primary osteoarthritis, left knee: Secondary | ICD-10-CM | POA: Diagnosis not present

## 2017-10-26 DIAGNOSIS — M1711 Unilateral primary osteoarthritis, right knee: Secondary | ICD-10-CM | POA: Diagnosis not present

## 2017-11-15 DIAGNOSIS — F411 Generalized anxiety disorder: Secondary | ICD-10-CM | POA: Diagnosis not present

## 2017-11-15 DIAGNOSIS — E782 Mixed hyperlipidemia: Secondary | ICD-10-CM | POA: Diagnosis not present

## 2017-11-15 DIAGNOSIS — E119 Type 2 diabetes mellitus without complications: Secondary | ICD-10-CM | POA: Diagnosis not present

## 2017-11-17 DIAGNOSIS — F331 Major depressive disorder, recurrent, moderate: Secondary | ICD-10-CM | POA: Diagnosis not present

## 2017-11-17 DIAGNOSIS — E782 Mixed hyperlipidemia: Secondary | ICD-10-CM | POA: Diagnosis not present

## 2017-11-17 DIAGNOSIS — R42 Dizziness and giddiness: Secondary | ICD-10-CM | POA: Diagnosis not present

## 2017-11-17 DIAGNOSIS — E039 Hypothyroidism, unspecified: Secondary | ICD-10-CM | POA: Diagnosis not present

## 2017-11-17 DIAGNOSIS — Z0000 Encounter for general adult medical examination without abnormal findings: Secondary | ICD-10-CM | POA: Diagnosis not present

## 2017-11-17 DIAGNOSIS — R000 Tachycardia, unspecified: Secondary | ICD-10-CM | POA: Diagnosis not present

## 2017-11-17 DIAGNOSIS — G894 Chronic pain syndrome: Secondary | ICD-10-CM | POA: Diagnosis not present

## 2017-11-17 DIAGNOSIS — G43019 Migraine without aura, intractable, without status migrainosus: Secondary | ICD-10-CM | POA: Diagnosis not present

## 2017-11-17 DIAGNOSIS — R7301 Impaired fasting glucose: Secondary | ICD-10-CM | POA: Diagnosis not present

## 2017-11-17 DIAGNOSIS — Z6823 Body mass index (BMI) 23.0-23.9, adult: Secondary | ICD-10-CM | POA: Diagnosis not present

## 2018-01-30 DIAGNOSIS — R42 Dizziness and giddiness: Secondary | ICD-10-CM | POA: Diagnosis not present

## 2018-01-30 DIAGNOSIS — G894 Chronic pain syndrome: Secondary | ICD-10-CM | POA: Diagnosis not present

## 2018-01-30 DIAGNOSIS — R000 Tachycardia, unspecified: Secondary | ICD-10-CM | POA: Diagnosis not present

## 2018-01-30 DIAGNOSIS — E785 Hyperlipidemia, unspecified: Secondary | ICD-10-CM | POA: Diagnosis not present

## 2018-01-30 DIAGNOSIS — R300 Dysuria: Secondary | ICD-10-CM | POA: Diagnosis not present

## 2018-01-30 DIAGNOSIS — R7301 Impaired fasting glucose: Secondary | ICD-10-CM | POA: Diagnosis not present

## 2018-01-30 DIAGNOSIS — G43019 Migraine without aura, intractable, without status migrainosus: Secondary | ICD-10-CM | POA: Diagnosis not present

## 2018-01-30 DIAGNOSIS — E782 Mixed hyperlipidemia: Secondary | ICD-10-CM | POA: Diagnosis not present

## 2018-01-30 DIAGNOSIS — E039 Hypothyroidism, unspecified: Secondary | ICD-10-CM | POA: Diagnosis not present

## 2018-01-30 DIAGNOSIS — F331 Major depressive disorder, recurrent, moderate: Secondary | ICD-10-CM | POA: Diagnosis not present

## 2018-02-28 DIAGNOSIS — G8929 Other chronic pain: Secondary | ICD-10-CM | POA: Diagnosis not present

## 2018-02-28 DIAGNOSIS — R000 Tachycardia, unspecified: Secondary | ICD-10-CM | POA: Diagnosis not present

## 2018-02-28 DIAGNOSIS — R300 Dysuria: Secondary | ICD-10-CM | POA: Diagnosis not present

## 2018-02-28 DIAGNOSIS — E039 Hypothyroidism, unspecified: Secondary | ICD-10-CM | POA: Diagnosis not present

## 2018-02-28 DIAGNOSIS — E782 Mixed hyperlipidemia: Secondary | ICD-10-CM | POA: Diagnosis not present

## 2018-02-28 DIAGNOSIS — E785 Hyperlipidemia, unspecified: Secondary | ICD-10-CM | POA: Diagnosis not present

## 2018-02-28 DIAGNOSIS — G43019 Migraine without aura, intractable, without status migrainosus: Secondary | ICD-10-CM | POA: Diagnosis not present

## 2018-02-28 DIAGNOSIS — G894 Chronic pain syndrome: Secondary | ICD-10-CM | POA: Diagnosis not present

## 2018-02-28 DIAGNOSIS — R7301 Impaired fasting glucose: Secondary | ICD-10-CM | POA: Diagnosis not present

## 2018-02-28 DIAGNOSIS — F331 Major depressive disorder, recurrent, moderate: Secondary | ICD-10-CM | POA: Diagnosis not present

## 2018-03-01 DIAGNOSIS — M25569 Pain in unspecified knee: Secondary | ICD-10-CM | POA: Diagnosis not present

## 2018-03-01 DIAGNOSIS — Z6822 Body mass index (BMI) 22.0-22.9, adult: Secondary | ICD-10-CM | POA: Diagnosis not present

## 2018-03-01 DIAGNOSIS — R000 Tachycardia, unspecified: Secondary | ICD-10-CM | POA: Diagnosis not present

## 2018-03-01 DIAGNOSIS — M545 Low back pain: Secondary | ICD-10-CM | POA: Diagnosis not present

## 2018-03-01 DIAGNOSIS — G8929 Other chronic pain: Secondary | ICD-10-CM | POA: Diagnosis not present

## 2018-03-01 DIAGNOSIS — G43019 Migraine without aura, intractable, without status migrainosus: Secondary | ICD-10-CM | POA: Diagnosis not present

## 2018-03-16 DIAGNOSIS — Z6823 Body mass index (BMI) 23.0-23.9, adult: Secondary | ICD-10-CM | POA: Diagnosis not present

## 2018-03-16 DIAGNOSIS — G894 Chronic pain syndrome: Secondary | ICD-10-CM | POA: Diagnosis not present

## 2018-03-16 DIAGNOSIS — G43019 Migraine without aura, intractable, without status migrainosus: Secondary | ICD-10-CM | POA: Diagnosis not present

## 2018-04-04 DIAGNOSIS — E782 Mixed hyperlipidemia: Secondary | ICD-10-CM | POA: Diagnosis not present

## 2018-04-04 DIAGNOSIS — E039 Hypothyroidism, unspecified: Secondary | ICD-10-CM | POA: Diagnosis not present

## 2018-04-04 DIAGNOSIS — G894 Chronic pain syndrome: Secondary | ICD-10-CM | POA: Diagnosis not present

## 2018-04-04 DIAGNOSIS — E785 Hyperlipidemia, unspecified: Secondary | ICD-10-CM | POA: Diagnosis not present

## 2018-04-04 DIAGNOSIS — R7301 Impaired fasting glucose: Secondary | ICD-10-CM | POA: Diagnosis not present

## 2018-04-04 DIAGNOSIS — F331 Major depressive disorder, recurrent, moderate: Secondary | ICD-10-CM | POA: Diagnosis not present

## 2018-04-04 DIAGNOSIS — R000 Tachycardia, unspecified: Secondary | ICD-10-CM | POA: Diagnosis not present

## 2018-04-04 DIAGNOSIS — G43019 Migraine without aura, intractable, without status migrainosus: Secondary | ICD-10-CM | POA: Diagnosis not present

## 2018-04-18 DIAGNOSIS — S00521A Blister (nonthermal) of lip, initial encounter: Secondary | ICD-10-CM | POA: Diagnosis not present

## 2018-04-18 DIAGNOSIS — R112 Nausea with vomiting, unspecified: Secondary | ICD-10-CM | POA: Diagnosis not present

## 2018-04-18 DIAGNOSIS — Z634 Disappearance and death of family member: Secondary | ICD-10-CM | POA: Diagnosis not present

## 2018-04-18 DIAGNOSIS — G894 Chronic pain syndrome: Secondary | ICD-10-CM | POA: Diagnosis not present

## 2018-04-18 DIAGNOSIS — R079 Chest pain, unspecified: Secondary | ICD-10-CM | POA: Diagnosis not present

## 2018-04-28 DIAGNOSIS — E039 Hypothyroidism, unspecified: Secondary | ICD-10-CM | POA: Diagnosis not present

## 2018-04-28 DIAGNOSIS — E782 Mixed hyperlipidemia: Secondary | ICD-10-CM | POA: Diagnosis not present

## 2018-04-28 DIAGNOSIS — R000 Tachycardia, unspecified: Secondary | ICD-10-CM | POA: Diagnosis not present

## 2018-04-28 DIAGNOSIS — F331 Major depressive disorder, recurrent, moderate: Secondary | ICD-10-CM | POA: Diagnosis not present

## 2018-05-16 DIAGNOSIS — E039 Hypothyroidism, unspecified: Secondary | ICD-10-CM | POA: Diagnosis not present

## 2018-05-16 DIAGNOSIS — E782 Mixed hyperlipidemia: Secondary | ICD-10-CM | POA: Diagnosis not present

## 2018-05-16 DIAGNOSIS — F331 Major depressive disorder, recurrent, moderate: Secondary | ICD-10-CM | POA: Diagnosis not present

## 2018-05-16 DIAGNOSIS — R000 Tachycardia, unspecified: Secondary | ICD-10-CM | POA: Diagnosis not present

## 2018-06-16 DIAGNOSIS — R300 Dysuria: Secondary | ICD-10-CM | POA: Diagnosis not present

## 2018-06-16 DIAGNOSIS — R7301 Impaired fasting glucose: Secondary | ICD-10-CM | POA: Diagnosis not present

## 2018-06-16 DIAGNOSIS — E785 Hyperlipidemia, unspecified: Secondary | ICD-10-CM | POA: Diagnosis not present

## 2018-06-16 DIAGNOSIS — E039 Hypothyroidism, unspecified: Secondary | ICD-10-CM | POA: Diagnosis not present

## 2018-06-16 DIAGNOSIS — E782 Mixed hyperlipidemia: Secondary | ICD-10-CM | POA: Diagnosis not present

## 2018-06-16 DIAGNOSIS — Z6823 Body mass index (BMI) 23.0-23.9, adult: Secondary | ICD-10-CM | POA: Diagnosis not present

## 2018-06-19 DIAGNOSIS — R000 Tachycardia, unspecified: Secondary | ICD-10-CM | POA: Diagnosis not present

## 2018-06-19 DIAGNOSIS — F331 Major depressive disorder, recurrent, moderate: Secondary | ICD-10-CM | POA: Diagnosis not present

## 2018-06-19 DIAGNOSIS — E782 Mixed hyperlipidemia: Secondary | ICD-10-CM | POA: Diagnosis not present

## 2018-06-19 DIAGNOSIS — E039 Hypothyroidism, unspecified: Secondary | ICD-10-CM | POA: Diagnosis not present

## 2018-06-23 DIAGNOSIS — E782 Mixed hyperlipidemia: Secondary | ICD-10-CM | POA: Diagnosis not present

## 2018-06-23 DIAGNOSIS — G894 Chronic pain syndrome: Secondary | ICD-10-CM | POA: Diagnosis not present

## 2018-06-23 DIAGNOSIS — F331 Major depressive disorder, recurrent, moderate: Secondary | ICD-10-CM | POA: Diagnosis not present

## 2018-06-23 DIAGNOSIS — R7301 Impaired fasting glucose: Secondary | ICD-10-CM | POA: Diagnosis not present

## 2018-06-23 DIAGNOSIS — Z634 Disappearance and death of family member: Secondary | ICD-10-CM | POA: Diagnosis not present

## 2018-06-23 DIAGNOSIS — R634 Abnormal weight loss: Secondary | ICD-10-CM | POA: Diagnosis not present

## 2018-06-23 DIAGNOSIS — Z87891 Personal history of nicotine dependence: Secondary | ICD-10-CM | POA: Diagnosis not present

## 2018-06-23 DIAGNOSIS — R000 Tachycardia, unspecified: Secondary | ICD-10-CM | POA: Diagnosis not present

## 2018-06-23 DIAGNOSIS — G43019 Migraine without aura, intractable, without status migrainosus: Secondary | ICD-10-CM | POA: Diagnosis not present

## 2018-07-17 DIAGNOSIS — F331 Major depressive disorder, recurrent, moderate: Secondary | ICD-10-CM | POA: Diagnosis not present

## 2018-07-17 DIAGNOSIS — E039 Hypothyroidism, unspecified: Secondary | ICD-10-CM | POA: Diagnosis not present

## 2018-07-17 DIAGNOSIS — G43019 Migraine without aura, intractable, without status migrainosus: Secondary | ICD-10-CM | POA: Diagnosis not present

## 2018-07-17 DIAGNOSIS — R7301 Impaired fasting glucose: Secondary | ICD-10-CM | POA: Diagnosis not present

## 2018-07-17 DIAGNOSIS — E785 Hyperlipidemia, unspecified: Secondary | ICD-10-CM | POA: Diagnosis not present

## 2018-07-17 DIAGNOSIS — E782 Mixed hyperlipidemia: Secondary | ICD-10-CM | POA: Diagnosis not present

## 2018-07-17 DIAGNOSIS — R000 Tachycardia, unspecified: Secondary | ICD-10-CM | POA: Diagnosis not present

## 2018-07-21 IMAGING — US US ABDOMEN COMPLETE
1 series · 13 of 25 positions shown · non-contrast
Comparison: Prior CT from 04/01/2012.

CLINICAL DATA: Initial evaluation for generalized abdominal pain
history of prior cholecystectomy.

EXAM:
ABDOMEN ULTRASOUND COMPLETE

[Series 1: us abdomen complete · 0.17mm/px · 13 of 111 slices shown]
[im 1/111]
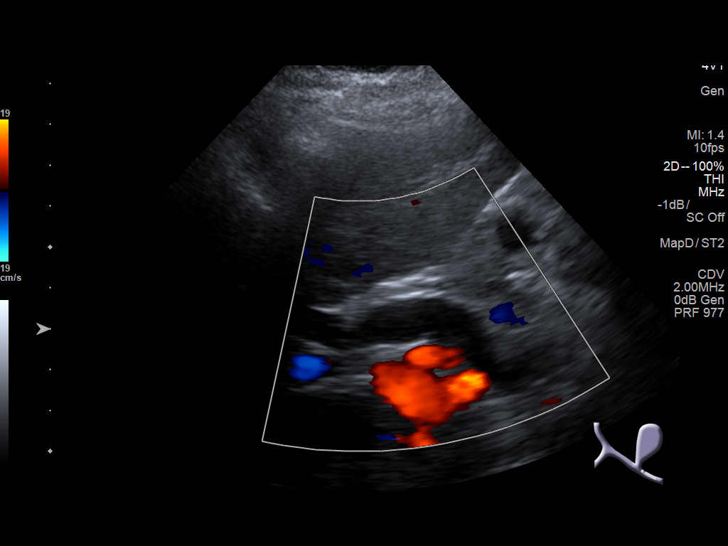
[im 10/111]
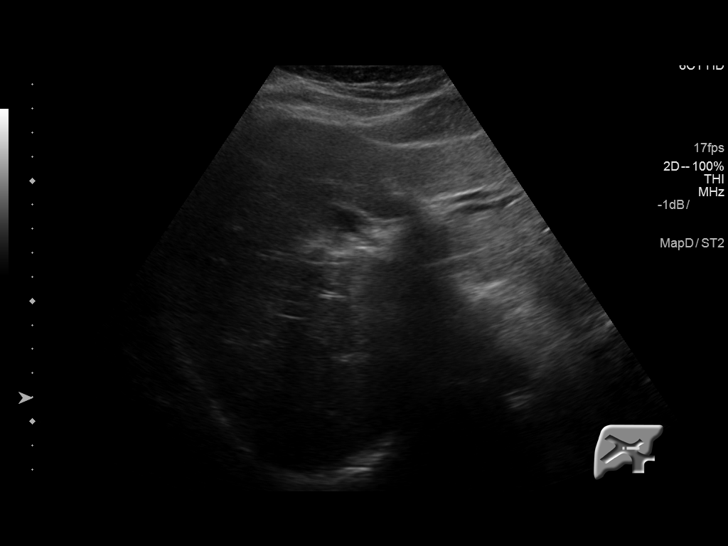
[im 19/111]
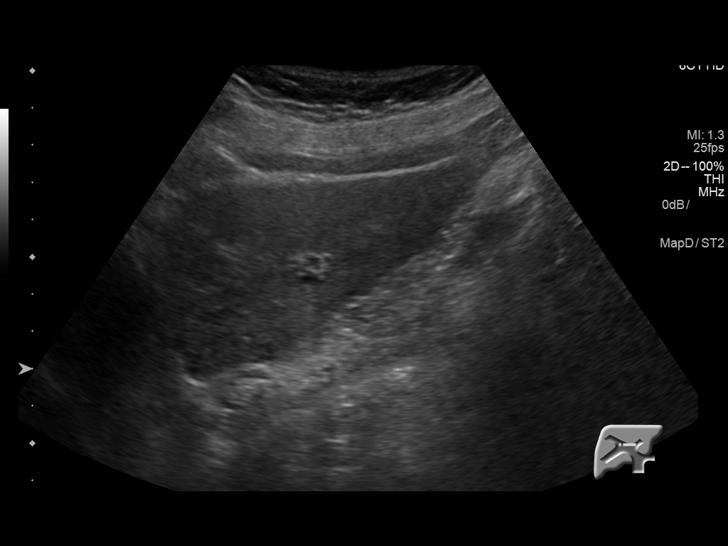
[im 28/111]
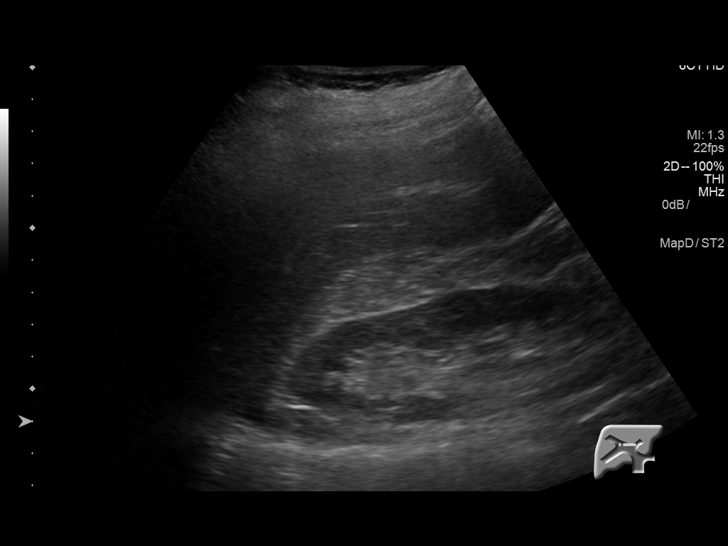
[im 37/111]
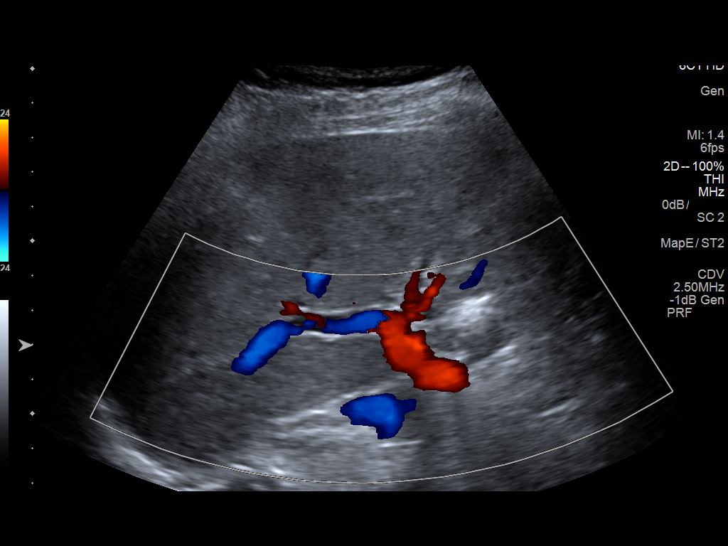
[im 46/111]
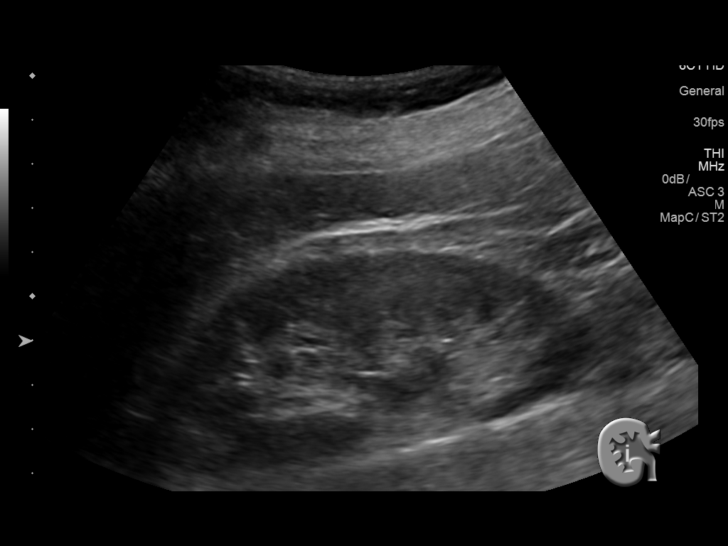
[im 56/111]
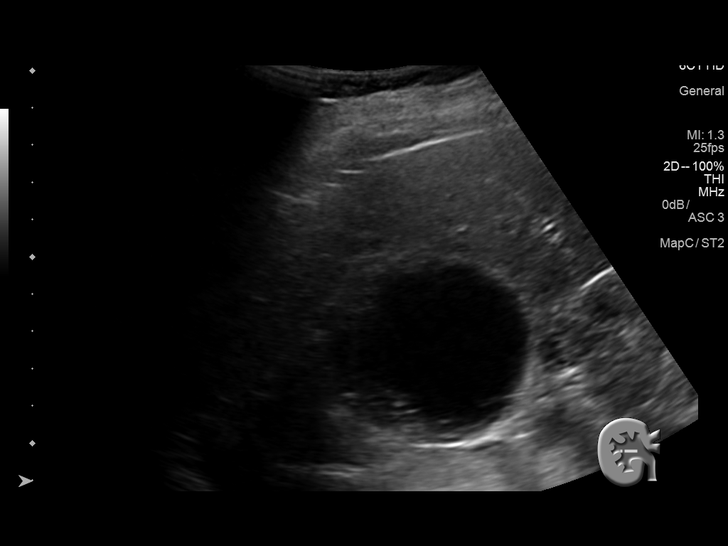
[im 65/111]
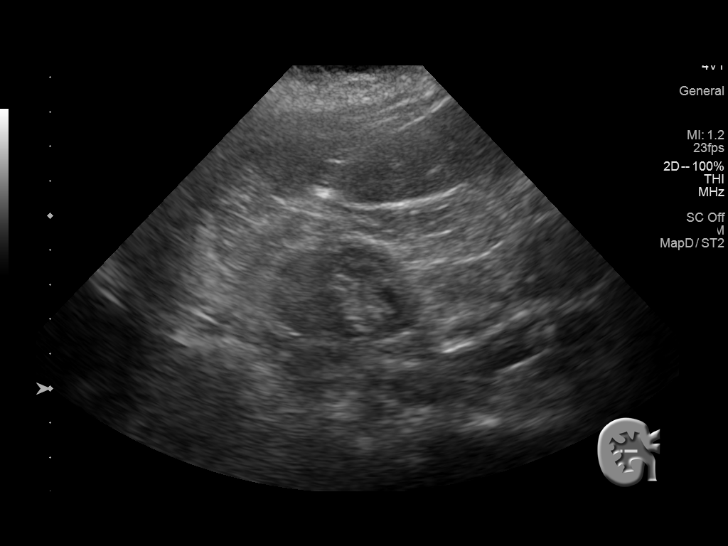
[im 74/111]
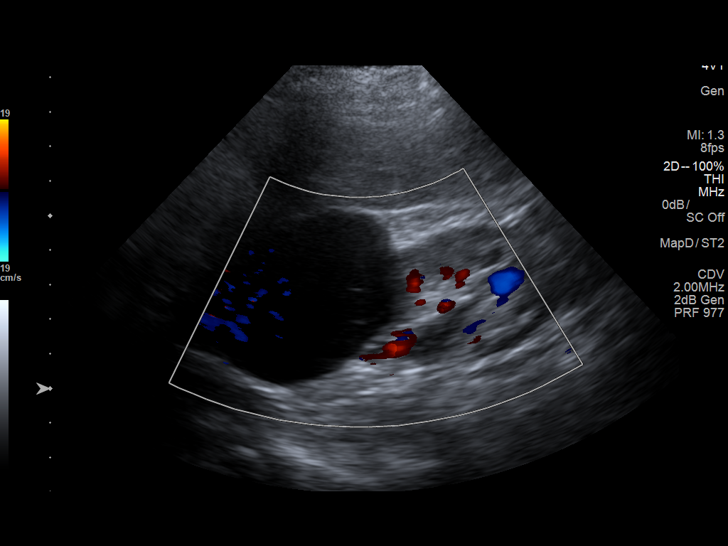
[im 83/111]
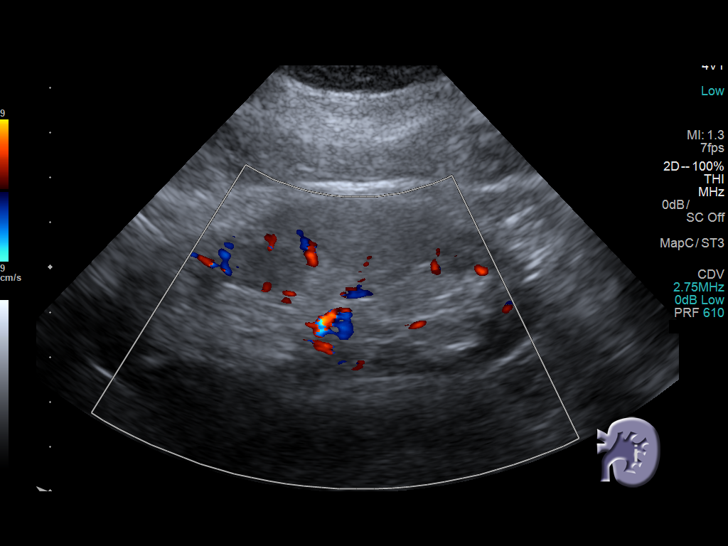
[im 92/111]
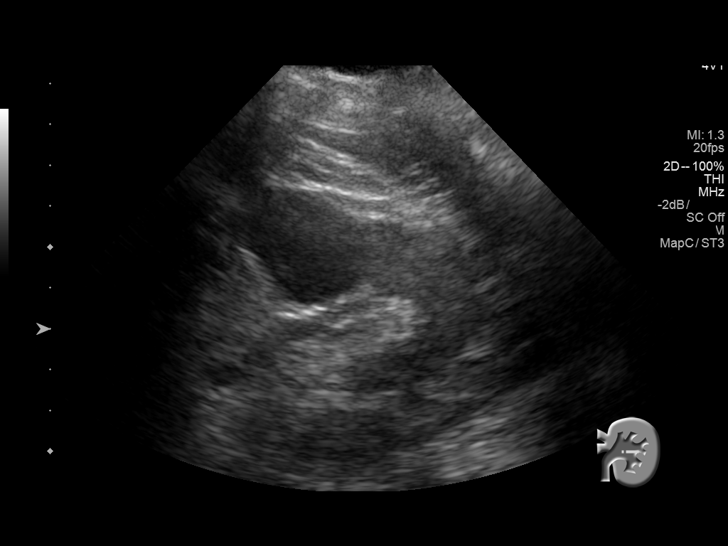
[im 101/111]
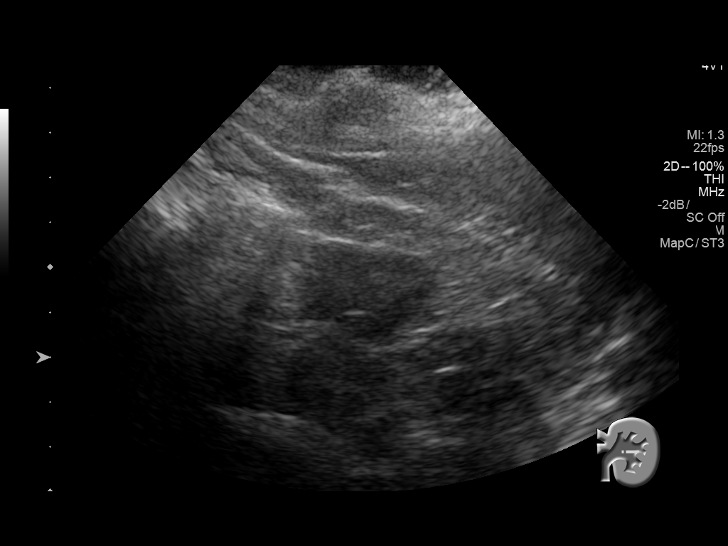
[im 111/111]
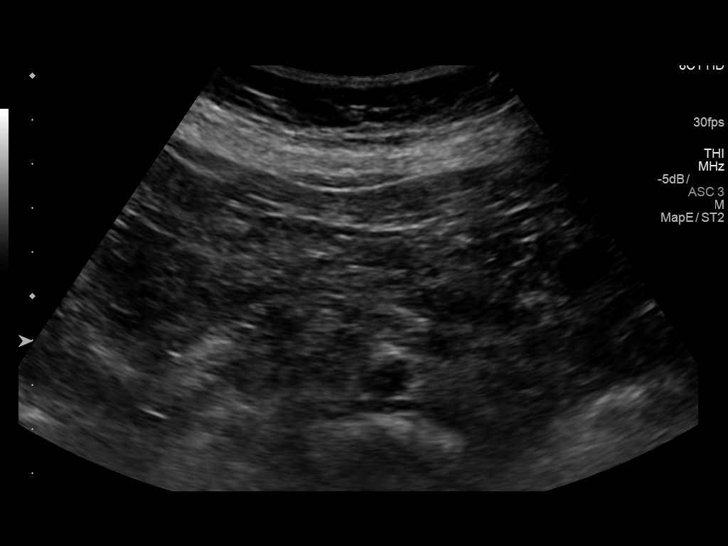

[13 of 25 positions shown; findings below may reference images not displayed]

FINDINGS: Gallbladder: Surgically absent.

Common bile duct: Diameter: 1.1 cm, minimally enlarged for age, like
related to post cholecystectomy changes.

Liver: No focal lesion identified. Echogenicity within normal
limits. Mild central intrahepatic biliary dilatation, like related
to post cholecystectomy changes as well.

IVC: No abnormality visualized.

Pancreas: Visualized portion unremarkable.

Spleen: Size and appearance within normal limits.

Right Kidney: Length: 9.8 cm. Echogenicity within normal limits.
x 5.1 x 5.7 cm anechoic cyst, similar to previous. Mild pelviectasis
without overt hydronephrosis, which appears relatively stable from
previous CT.

Left Kidney: Length: 10.0 cm. Echogenicity within normal limits.
x 2.9 x 3.1 cm anechoic cyst noted. No hydronephrosis.

Abdominal aorta: No aneurysm visualized.

Other findings: None.
IMPRESSION: 1. No sonographic evidence for acute abnormality within the abdomen.
2. Status post cholecystectomy. Mild biliary dilatation most likely
related to post cholecystectomy changes.
3. Mild right renal pelviectasis without overt hydronephrosis,
grossly unchanged relative to prior CT from 04/01/2012.
[DATE]. Bilateral renal cysts as above.

## 2018-08-09 ENCOUNTER — Ambulatory Visit (INDEPENDENT_AMBULATORY_CARE_PROVIDER_SITE_OTHER): Payer: PPO | Admitting: Internal Medicine

## 2018-08-09 ENCOUNTER — Encounter (INDEPENDENT_AMBULATORY_CARE_PROVIDER_SITE_OTHER): Payer: Self-pay | Admitting: Internal Medicine

## 2018-08-09 VITALS — BP 141/64 | HR 72 | Temp 98.5°F | Ht 63.0 in | Wt 118.3 lb

## 2018-08-09 DIAGNOSIS — R1319 Other dysphagia: Secondary | ICD-10-CM

## 2018-08-09 DIAGNOSIS — R1310 Dysphagia, unspecified: Secondary | ICD-10-CM | POA: Diagnosis not present

## 2018-08-09 MED ORDER — OMEPRAZOLE 40 MG PO CPDR: 40.0000 mg | DELAYED_RELEASE_CAPSULE | Freq: Every day | ORAL | 11 refills | Status: DC

## 2018-08-09 NOTE — Patient Instructions (Signed)
The risks of bleeding, perforation and infection were reviewed with patient.  

## 2018-08-09 NOTE — Progress Notes (Signed)
Subjective:    Patient ID: [name], female    DOB: [birth year], 73 y.o.   MRN: [id number]  HPI Referred by Dr. [name] for dysphagia. Hx of same. Underwent an EGD in 2017 for dysphagia. Esophageal mucosal changes suggestive of eosinophilic esophagitis but no focal or discrete stricture noted. Small sliding hiatal hernia. Multiple antral erosions along with small ulcer.  Esophagus dilated by passing 36, 46 and 48 French Maloney dilators resulting in small linear mucosal disruption at level of UES suggestive of disrupted esophageal web. Esophageal biopsies also taken to rule out eosinophilic esophagitis. States she is having a hard time swallowing her pills. Has to grind them up well. Sometimes she strangles on fluid. Symptoms for at least a year. Does not eat meats.     Review of Systems Past Medical History:  Diagnosis Date  . Anxiety   . Back pain   . Fibromyalgia   . Hyperlipemia   . Hypothyroidism   . Neuropathy   . Sinusitis   . Stroke [facility])    no deficits    Past Surgical History:  Procedure Laterality Date  . ABDOMINAL HYSTERECTOMY    . CATARACT EXTRACTION Bilateral   . CHOLECYSTECTOMY    . ESOPHAGEAL DILATION N/A 06/27/2015   Procedure: ESOPHAGEAL DILATION;  Surgeon: Malissa Hippo, MD;  [facility];  Service: Endoscopy;  Laterality: N/A;  . ESOPHAGOGASTRODUODENOSCOPY (EGD) WITH PROPOFOL N/A 06/27/2015   Procedure: ESOPHAGOGASTRODUODENOSCOPY (EGD) WITH PROPOFOL;  Surgeon: Malissa Hippo, MD;  [facility];  Service: Endoscopy;  Laterality: N/A;    Allergies  Allergen Reactions  . Codeine     nausea  . Prednisone Palpitations and Hypertension    Current Outpatient Medications on File Prior to Visit  Medication Sig Dispense Refill  . butalbital-acetaminophen-caffeine (FIORICET, ESGIC) 50-325-40 MG per tablet Take 2 tablets by mouth 3 (three) times daily as needed for headache or migraine.     . chlordiazePOXIDE (LIBRIUM) 25 MG capsule  Take 25 mg by mouth 3 (three) times daily.    . cyclobenzaprine (FLEXERIL) 10 MG tablet Take 10 mg by mouth 3 (three) times daily as needed for muscle spasms.     Marland Kitchen gabapentin (NEURONTIN) 100 MG capsule Take 100 mg by mouth 2 (two) times daily.    . metoCLOPramide (REGLAN) 5 MG tablet Take 5 mg by mouth as needed for nausea.    . Oxycodone HCl 10 MG TABS Take 10 mg by mouth every 4 (four) hours.     . propranolol (INDERAL) 40 MG tablet Take 40 mg by mouth 3 (three) times daily.    . sertraline (ZOLOFT) 50 MG tablet Take 1 tablet (50 mg total) by mouth daily. 30 tablet 2  . simvastatin (ZOCOR) 40 MG tablet Take 40 mg by mouth daily at 6 PM.   4  . valACYclovir (VALTREX) 1000 MG tablet Take 1,000 mg by mouth daily as needed (for fever blisters).     No current facility-administered medications on file prior to visit.         Objective:   Physical Exam Blood pressure (!) 141/64, pulse 72, temperature 98.5 F (36.9 C), height 5\' 3"  (1.6 m), weight 118 lb 4.8 oz (53.7 kg). Alert and oriented. Skin warm and dry. Oral mucosa is moist.   . Sclera anicteric, conjunctivae is pink. Thyroid not enlarged. No cervical lymphadenopathy. Lungs clear. Heart regular rate and rhythm.  Abdomen is soft. Bowel sounds are positive. No hepatomegaly. No abdominal masses  felt. No tenderness.  No edema to lower extremities.          Assessment & Plan:  Dysphagia. Hx of same (web). EGD/ED with propofol.  The risks of bleeding, perforation and infection were reviewed with patient.

## 2018-08-10 ENCOUNTER — Encounter (INDEPENDENT_AMBULATORY_CARE_PROVIDER_SITE_OTHER): Payer: Self-pay | Admitting: *Deleted

## 2018-08-10 ENCOUNTER — Other Ambulatory Visit (INDEPENDENT_AMBULATORY_CARE_PROVIDER_SITE_OTHER): Payer: Self-pay | Admitting: Internal Medicine

## 2018-08-10 DIAGNOSIS — R1310 Dysphagia, unspecified: Secondary | ICD-10-CM

## 2018-08-10 DIAGNOSIS — R1319 Other dysphagia: Secondary | ICD-10-CM | POA: Insufficient documentation

## 2018-08-17 ENCOUNTER — Encounter (HOSPITAL_COMMUNITY): Payer: Self-pay

## 2018-08-18 ENCOUNTER — Encounter (HOSPITAL_COMMUNITY)
Admission: RE | Admit: 2018-08-18 | Discharge: 2018-08-18 | Disposition: A | Discharge status: Home / Self Care | Payer: PPO | Source: Ambulatory Visit | Attending: Internal Medicine | Admitting: Internal Medicine

## 2018-08-18 DIAGNOSIS — R1310 Dysphagia, unspecified: Secondary | ICD-10-CM

## 2018-08-18 DIAGNOSIS — R1319 Other dysphagia: Secondary | ICD-10-CM

## 2018-08-25 ENCOUNTER — Encounter (HOSPITAL_COMMUNITY)
Admission: RE | Disposition: A | Discharge status: Home / Self Care | Payer: Self-pay | Source: Home / Self Care | Attending: Internal Medicine

## 2018-08-25 ENCOUNTER — Ambulatory Visit (HOSPITAL_COMMUNITY): Payer: PPO | Admitting: Anesthesiology

## 2018-08-25 ENCOUNTER — Ambulatory Visit (HOSPITAL_COMMUNITY)
Admission: RE | Admit: 2018-08-25 | Discharge: 2018-08-25 | Disposition: A | Discharge status: Home / Self Care | Payer: PPO | Attending: Internal Medicine | Admitting: Internal Medicine

## 2018-08-25 ENCOUNTER — Encounter (HOSPITAL_COMMUNITY): Payer: Self-pay | Admitting: *Deleted

## 2018-08-25 ENCOUNTER — Other Ambulatory Visit: Payer: Self-pay

## 2018-08-25 DIAGNOSIS — K317 Polyp of stomach and duodenum: Secondary | ICD-10-CM | POA: Diagnosis not present

## 2018-08-25 DIAGNOSIS — Z8673 Personal history of transient ischemic attack (TIA), and cerebral infarction without residual deficits: Secondary | ICD-10-CM | POA: Insufficient documentation

## 2018-08-25 DIAGNOSIS — E039 Hypothyroidism, unspecified: Secondary | ICD-10-CM | POA: Diagnosis not present

## 2018-08-25 DIAGNOSIS — G629 Polyneuropathy, unspecified: Secondary | ICD-10-CM | POA: Insufficient documentation

## 2018-08-25 DIAGNOSIS — F17210 Nicotine dependence, cigarettes, uncomplicated: Secondary | ICD-10-CM | POA: Diagnosis not present

## 2018-08-25 DIAGNOSIS — K219 Gastro-esophageal reflux disease without esophagitis: Secondary | ICD-10-CM | POA: Diagnosis not present

## 2018-08-25 DIAGNOSIS — Q394 Esophageal web: Secondary | ICD-10-CM | POA: Insufficient documentation

## 2018-08-25 DIAGNOSIS — R1314 Dysphagia, pharyngoesophageal phase: Secondary | ICD-10-CM | POA: Diagnosis not present

## 2018-08-25 DIAGNOSIS — R1319 Other dysphagia: Secondary | ICD-10-CM | POA: Insufficient documentation

## 2018-08-25 DIAGNOSIS — F17200 Nicotine dependence, unspecified, uncomplicated: Secondary | ICD-10-CM | POA: Diagnosis not present

## 2018-08-25 DIAGNOSIS — M797 Fibromyalgia: Secondary | ICD-10-CM | POA: Insufficient documentation

## 2018-08-25 DIAGNOSIS — Z79899 Other long term (current) drug therapy: Secondary | ICD-10-CM | POA: Insufficient documentation

## 2018-08-25 DIAGNOSIS — R1310 Dysphagia, unspecified: Secondary | ICD-10-CM

## 2018-08-25 DIAGNOSIS — F419 Anxiety disorder, unspecified: Secondary | ICD-10-CM | POA: Insufficient documentation

## 2018-08-25 DIAGNOSIS — G8929 Other chronic pain: Secondary | ICD-10-CM | POA: Diagnosis not present

## 2018-08-25 DIAGNOSIS — E785 Hyperlipidemia, unspecified: Secondary | ICD-10-CM | POA: Diagnosis not present

## 2018-08-25 DIAGNOSIS — K449 Diaphragmatic hernia without obstruction or gangrene: Secondary | ICD-10-CM | POA: Diagnosis not present

## 2018-08-25 HISTORY — PX: BIOPSY: SHX5522

## 2018-08-25 HISTORY — PX: ESOPHAGEAL DILATION: SHX303

## 2018-08-25 HISTORY — PX: ESOPHAGOGASTRODUODENOSCOPY (EGD) WITH PROPOFOL: SHX5813

## 2018-08-25 SURGERY — ESOPHAGOGASTRODUODENOSCOPY (EGD) WITH PROPOFOL
Anesthesia: General

## 2018-08-25 MED ORDER — SUGAMMADEX SODIUM 200 MG/2ML IV SOLN
INTRAVENOUS | Status: AC
  Filled 2018-08-25: qty 2

## 2018-08-25 MED ORDER — PROPOFOL 500 MG/50ML IV EMUL
INTRAVENOUS | Status: DC | PRN
  Administered 2018-08-25: 150 ug/kg/min via INTRAVENOUS

## 2018-08-25 MED ORDER — PROPOFOL 10 MG/ML IV BOLUS
INTRAVENOUS | Status: AC
  Filled 2018-08-25: qty 40

## 2018-08-25 MED ORDER — LIDOCAINE HCL (PF) 1 % IJ SOLN
INTRAMUSCULAR | Status: AC
  Filled 2018-08-25: qty 5

## 2018-08-25 MED ORDER — MINERAL OIL PO OIL
TOPICAL_OIL | ORAL | Status: AC
  Filled 2018-08-25: qty 60

## 2018-08-25 MED ORDER — LIDOCAINE VISCOUS HCL 2 % MT SOLN
OROMUCOSAL | Status: AC
  Filled 2018-08-25: qty 30

## 2018-08-25 MED ORDER — ETOMIDATE 2 MG/ML IV SOLN
INTRAVENOUS | Status: AC
  Filled 2018-08-25: qty 10

## 2018-08-25 MED ORDER — PANTOPRAZOLE SODIUM 40 MG PO TBEC: 40.0000 mg | DELAYED_RELEASE_TABLET | Freq: Every day | ORAL | 5 refills | Status: DC

## 2018-08-25 MED ORDER — LACTATED RINGERS IV SOLN
INTRAVENOUS | Status: DC
  Administered 2018-08-25: 08:00:00 via INTRAVENOUS

## 2018-08-25 MED ORDER — PROPOFOL 10 MG/ML IV BOLUS
INTRAVENOUS | Status: DC | PRN
  Administered 2018-08-25 (×2): 20 mg via INTRAVENOUS

## 2018-08-25 MED ORDER — PROMETHAZINE HCL 25 MG/ML IJ SOLN: 6.2500 mg | INTRAMUSCULAR | Status: DC | PRN

## 2018-08-25 MED ORDER — CHLORHEXIDINE GLUCONATE CLOTH 2 % EX PADS: 6.0000 | MEDICATED_PAD | Freq: Once | CUTANEOUS | Status: DC

## 2018-08-25 MED ORDER — MIDAZOLAM HCL 2 MG/2ML IJ SOLN: 0.5000 mg | Freq: Once | INTRAMUSCULAR | Status: DC | PRN

## 2018-08-25 MED ORDER — PHENYLEPHRINE 40 MCG/ML (10ML) SYRINGE FOR IV PUSH (FOR BLOOD PRESSURE SUPPORT)
PREFILLED_SYRINGE | INTRAVENOUS | Status: AC
  Filled 2018-08-25: qty 10

## 2018-08-25 MED ORDER — SODIUM CHLORIDE (PF) 0.9 % IJ SOLN
INTRAMUSCULAR | Status: AC
  Filled 2018-08-25: qty 10

## 2018-08-25 NOTE — Discharge Instructions (Signed)
No aspirin or NSAIDs for 24 hours. Resume usual medications and diet as before. No driving for 24 hours. Physician will call with biopsy results.        Upper Endoscopy, Adult, Care After This sheet gives you information about how to care for yourself after your procedure. Your health care provider may also give you more specific instructions. If you have problems or questions, contact your health care provider. What can I expect after the procedure? After the procedure, it is common to have:  A sore throat.  Mild stomach pain or discomfort.  Bloating.  Nausea. Follow these instructions at home:   Follow instructions from your health care provider about what to eat or drink after your procedure.  Return to your normal activities as told by your health care provider. Ask your health care provider what activities are safe for you.  Take over-the-counter and prescription medicines only as told by your health care provider.  Do not drive for 24 hours if you were given a sedative during your procedure.  Keep all follow-up visits as told by your health care provider. This is important. Contact a health care provider if you have:  A sore throat that lasts longer than one day.  Trouble swallowing. Get help right away if:  You vomit blood or your vomit looks like coffee grounds.  You have: ? A fever. ? Bloody, black, or tarry stools. ? A severe sore throat or you cannot swallow. ? Difficulty breathing. ? Severe pain in your chest or abdomen. Summary  After the procedure, it is common to have a sore throat, mild stomach discomfort, bloating, and nausea.  Do not drive for 24 hours if you were given a sedative during the procedure.  Follow instructions from your health care provider about what to eat or drink after your procedure.  Return to your normal activities as told by your health care provider. This information is not intended to replace advice given to you by  your health care provider. Make sure you discuss any questions you have with your health care provider. Document Released: 11/30/2011 Document Revised: 10/31/2017 Document Reviewed: 10/31/2017 Elsevier Interactive Patient Education  2019 [employer]vier Inc[employer]     Monitored Anesthesia Care, Care After These instructions provide you with information about caring for yourself after your procedure. Your health care provider may also give you more specific instructions. Your treatment has been planned according to current medical practices, but problems sometimes occur. Call your health care provider if you have any problems or questions after your procedure. What can I expect after the procedure? After your procedure, you may:  Feel sleepy for several hours.  Feel clumsy and have poor balance for several hours.  Feel forgetful about what happened after the procedure.  Have poor judgment for several hours.  Feel nauseous or vomit.  Have a sore throat if you had a breathing tube during the procedure. Follow these instructions at home: For at least 24 hours after the procedure:      Have a responsible adult stay with you. It is important to have someone help care for you until you are awake and alert.  Rest as needed.  Do not: ? Participate in activities in which you could fall or become injured. ? Drive. ? Use heavy machinery. ? Drink alcohol. ? Take sleeping pills or medicines that cause drowsiness. ? Make important decisions or sign legal documents. ? Take care of children on your own. Eating and drinking  Follow the  diet that is recommended by your health care provider.  If you vomit, drink water, juice, or soup when you can drink without vomiting.  Make sure you have little or no nausea before eating solid foods. General instructions  Take over-the-counter and prescription medicines only as told by your health care provider.  If you have sleep apnea, surgery and certain  medicines can increase your risk for breathing problems. Follow instructions from your health care provider about wearing your sleep device: ? Anytime you are sleeping, including during daytime naps. ? While taking prescription pain medicines, sleeping medicines, or medicines that make you drowsy.  If you smoke, do not smoke without supervision.  Keep all follow-up visits as told by your health care provider. This is important. Contact a health care provider if:  You keep feeling nauseous or you keep vomiting.  You feel light-headed.  You develop a rash.  You have a fever. Get help right away if:  You have trouble breathing. Summary  For several hours after your procedure, you may feel sleepy and have poor judgment.  Have a responsible adult stay with you for at least 24 hours or until you are awake and alert. This information is not intended to replace advice given to you by your health care provider. Make sure you discuss any questions you have with your health care provider. Document Released: 09/21/2015 Document Revised: 01/14/2017 Document Reviewed: 09/21/2015 Elsevier Interactive Patient Education  2019 [employer].

## 2018-08-25 NOTE — H&P (Addendum)
[name] is an 73 y.o. female.   Chief Complaint: Patient is here for EGD and ED. HPI: Patient is 73 year old Caucasian female who has history of GERD maintained on a PPI with satisfactory control of heartburn as well as history of eosinophilic esophagitis and esophageal web who presents with dysphagia to solids and pills.  She points to suprasternal area site of bolus obstruction.  She says she has had dysphagia for the past few months but lately occurring every day.  She denies nausea vomiting abdominal pain melena or weight loss. She is on pain medication for chronic back pain and fibromyalgia.  Past Medical History:  Diagnosis Date  . Anxiety   . Back pain   . Fibromyalgia   . Hyperlipemia   . Hypothyroidism   . Neuropathy   . Sinusitis   . Stroke [name])    no deficits       GERD.      Eosinophilic esophagitis.  Past Surgical History:  Procedure Laterality Date  . ABDOMINAL HYSTERECTOMY    . CATARACT EXTRACTION Bilateral   . CHOLECYSTECTOMY    . ESOPHAGEAL DILATION N/A 06/27/2015   Procedure: ESOPHAGEAL DILATION;  Surgeon: Malissa Hippo, MD;  [facility];  Service: Endoscopy;  Laterality: N/A;  . ESOPHAGOGASTRODUODENOSCOPY (EGD) WITH PROPOFOL N/A 06/27/2015   Procedure: ESOPHAGOGASTRODUODENOSCOPY (EGD) WITH PROPOFOL;  Surgeon: Malissa Hippo, MD;  [facility];  Service: Endoscopy;  Laterality: N/A;    Family History  Problem Relation Age of Onset  . Depression Mother   . Depression Father   . Depression Sister   . Anxiety disorder Sister   . Anxiety disorder Sister   . Alcohol abuse Neg Hx   . Drug abuse Neg Hx    Social History:  reports that she has been smoking cigarettes. She has a 12.50 pack-year smoking history. She has never used smokeless tobacco. She reports that she does not drink alcohol or use drugs.  Allergies:  Allergies  Allergen Reactions  . Codeine     nausea  . Prednisone Palpitations and Hypertension     Medications Prior to Admission  Medication Sig Dispense Refill  . amitriptyline (ELAVIL) 25 MG tablet Take 25 mg by mouth 3 (three) times daily.    . butalbital-acetaminophen-caffeine (FIORICET, ESGIC) 50-325-40 MG per tablet Take 2 tablets by mouth 3 (three) times daily as needed for headache or migraine.     . chlordiazePOXIDE (LIBRIUM) 25 MG capsule Take 25 mg by mouth 3 (three) times daily.    . cyclobenzaprine (FLEXERIL) 10 MG tablet Take 10 mg by mouth 3 (three) times daily as needed for muscle spasms.     Marland Kitchen gabapentin (NEURONTIN) 100 MG capsule Take 100 mg by mouth 2 (two) times daily.    . metoCLOPramide (REGLAN) 5 MG tablet Take 5 mg by mouth every 8 (eight) hours as needed for nausea.     . Oxycodone HCl 10 MG TABS Take 10 mg by mouth every 4 (four) hours.     . propranolol (INDERAL) 40 MG tablet Take 40 mg by mouth 2 (two) times daily.     . simvastatin (ZOCOR) 40 MG tablet Take 40 mg by mouth at bedtime.   4  . pantoprazole (PROTONIX) 40 MG tablet Take 40 mg by mouth daily.      No results found for this or any previous visit (from the past 48 hour(s)). No results found.  ROS  Blood pressure (!) 157/74, pulse 76, temperature  98.3 F (36.8 C), temperature source Oral, resp. rate 17, height 5\' 3"  (1.6 m), weight 53.6 kg, SpO2 95 %. Physical Exam  Constitutional: She appears well-developed and well-nourished.  HENT:  Mouth/Throat: Oropharynx is clear and moist.  Eyes: Conjunctivae are normal. No scleral icterus.  Neck: No thyromegaly present.  GI:  She has circular periumbilical scar.  Abdomen is soft and nontender with organomegaly or masses. Small subcutaneous lump in epigastric region felt to be lipoma.  Musculoskeletal:        General: No edema.  Neurological: She is alert.  Skin: Skin is warm and dry.     Assessment/Plan Esophageal dysphagia. History of eosinophilic esophagitis as well as esophageal web. EGD with ED.  Lionel December, MD 08/25/2018, 8:23  AM

## 2018-08-25 NOTE — Anesthesia Preprocedure Evaluation (Signed)
Anesthesia Evaluation  Patient identified by MRN, date of birth, ID band Patient awake    Reviewed: Allergy & Precautions, NPO status , Patient's Chart, lab work & pertinent test results  Airway Mallampati: II  TM Distance: >3 FB Neck ROM: Full    Dental no notable dental hx. (+) Teeth Intact   Pulmonary neg pulmonary ROS, Current Smoker,  Smoker 1 ppd for mult years - prob COPD by history    Pulmonary exam normal breath sounds clear to auscultation       Cardiovascular negative cardio ROS Normal cardiovascular exam Rhythm:Regular Rate:Normal     Neuro/Psych Anxiety Reports 3 CVAs in past -denies residual   Neuromuscular disease CVA, No Residual Symptoms negative psych ROS   GI/Hepatic negative GI ROS, Neg liver ROS,   Endo/Other  Hypothyroidism   Renal/GU negative Renal ROS  negative genitourinary   Musculoskeletal  (+) Fibromyalgia -, narcotic dependent  Abdominal   Peds negative pediatric ROS (+)  Hematology negative hematology ROS (+)   Anesthesia Other Findings   Reproductive/Obstetrics negative OB ROS                             Anesthesia Physical Anesthesia Plan  ASA: III  Anesthesia Plan: General   Post-op Pain Management:    Induction: Intravenous  PONV Risk Score and Plan:   Airway Management Planned: Nasal Cannula and Simple Face Mask  Additional Equipment:   Intra-op Plan:   Post-operative Plan: Extubation in OR  Informed Consent: I have reviewed the patients History and Physical, chart, labs and discussed the procedure including the risks, benefits and alternatives for the proposed anesthesia with the patient or authorized representative who has indicated his/her understanding and acceptance.     Dental advisory given  Plan Discussed with: CRNA  Anesthesia Plan Comments: (GA vs GETA as needed)        Anesthesia Quick Evaluation

## 2018-08-25 NOTE — Anesthesia Postprocedure Evaluation (Signed)
Anesthesia Post Note  Patient: [name]  Procedure(s) Performed: ESOPHAGOGASTRODUODENOSCOPY (EGD) WITH PROPOFOL (N/A ) ESOPHAGEAL DILATION (N/A ) BIOPSY  Patient location during evaluation: PACU Anesthesia Type: General Level of consciousness: awake and alert and oriented Pain management: pain level controlled Vital Signs Assessment: post-procedure vital signs reviewed and stable Respiratory status: spontaneous breathing Cardiovascular status: stable and blood pressure returned to baseline Postop Assessment: no apparent nausea or vomiting Anesthetic complications: no     Last Vitals:  Vitals:   08/25/18 0816 08/25/18 0910  BP: (!) 157/74 (!) 110/54  Pulse: 76 69  Resp: 17 15  Temp: 36.8 C (!) (P) 36.4 C  SpO2: 95% 95%    Last Pain:  Vitals:   08/25/18 0841  TempSrc:   PainSc: 0-No pain                 Landry Lookingbill

## 2018-08-25 NOTE — Progress Notes (Signed)
When arrived to PACU from ENDO, incontinent of large amt of urine. Underwear and pants wet. Ambulated to BR. Undressed and cleansed self. Mesh panties and peri pad applied. Disposable scrub pants provided. Dressed self. Ambulated to postop rm 7 to be discharged. Tolerated well.

## 2018-08-25 NOTE — Addendum Note (Signed)
Addendum  created 08/25/18 1027 by Samaad Hashem E, CRNA   Intraprocedure Flowsheets edited    

## 2018-08-25 NOTE — Op Note (Signed)
[facility] Patient Name: Vickie Homanslizabeth Bradby Procedure Date: 08/25/2018 8:42 AM MRN: [id number] Date of Birth: 1946/05/11 Attending MD: Lionel DecemberNajeeb Rehman , MD CSN: [id number] Age: [age] Admit Type: Outpatient Procedure:                Upper GI endoscopy Indications:              Esophageal dysphagia Providers:                Lionel DecemberNajeeb Rehman, MD, Jannett CelestineAnitra Bell, RN, Edythe ClarityKelly Cox,                            Technician Referring MD:             Catalina PizzaZach Hall, MD Medicines:                Propofol per Anesthesia Complications:            No immediate complications. Estimated Blood Loss:     Estimated blood loss was minimal. Procedure:                Pre-Anesthesia Assessment:                           - Prior to the procedure, a History and Physical                            was performed, and patient medications and                            allergies were reviewed. The patient's tolerance of                            previous anesthesia was also reviewed. The risks                            and benefits of the procedure and the sedation                            options and risks were discussed with the patient.                            All questions were answered, and informed consent                            was obtained. Prior Anticoagulants: The patient has                            taken no previous anticoagulant or antiplatelet                            agents. ASA Grade Assessment: III - A patient with                            severe systemic disease. After reviewing the risks  and benefits, the patient was deemed in                            satisfactory condition to undergo the procedure.                           After obtaining informed consent, the endoscope was                            passed under direct vision. Throughout the                            procedure, the patient's blood pressure, pulse, and                            oxygen saturations  were monitored continuously. The                            [id number]-H190 ([id number]) scope was introduced through the                            and advanced to the second part of duodenum. The                            upper GI endoscopy was accomplished without                            difficulty. The patient tolerated the procedure                            well. Scope In: 8:46:19 AM Scope Out: 8:59:33 AM Total Procedure Duration: 0 hours 13 minutes 14 seconds  Findings:      A web was found in the proximal esophagus. The scope was withdrawn.       Dilation was performed with a Maloney dilator with mild resistance at 44       Fr, 48 Fr and 52 Fr. The dilation site was examined following endoscope       reinsertion and showed mild mucosal disruption, complete resolution of       luminal narrowing and no perforation.      The exam of the esophagus was otherwise normal.      The Z-line was regular and was found 35 cm from the incisors.      A 2 cm hiatal hernia was present.      A few small sessile polyps with no stigmata of recent bleeding were       found in the gastric fundus. Biopsies were taken with a cold forceps for       histology.      The exam of the stomach was otherwise normal.      The duodenal bulb and second portion of the duodenum were normal. Impression:               - Web in the proximal esophagus. Dilated.                           -  Z-line regular, 35 cm from the incisors.                           - 2 cm hiatal hernia.                           - A few gastric polyps. Biopsied.                           - Normal duodenal bulb and second portion of the                            duodenum. Moderate Sedation:      Per Anesthesia Care Recommendation:           - Patient has a contact number available for                            emergencies. The signs and symptoms of potential                            delayed complications were discussed with the                             patient. Return to normal activities tomorrow.                            Written discharge instructions were provided to the                            patient.                           - Resume previous diet today.                           - Continue present medications.                           - No aspirin, ibuprofen, naproxen, or other                            non-steroidal anti-inflammatory drugs for 1 day.                           - Await pathology results.                           - Telephone GI clinic in 1 week. Procedure Code(s):        --- Professional ---                           [phone], Esophagogastroduodenoscopy, flexible,                            transoral; with biopsy, single or multiple  43450, Dilation of esophagus, by unguided sound or                            bougie, single or multiple passes Diagnosis Code(s):        --- Professional ---                           Q39.4, Esophageal web                           K44.9, Diaphragmatic hernia without obstruction or                            gangrene                           K31.7, Polyp of stomach and duodenum                           R13.14, Dysphagia, pharyngoesophageal phase CPT copyright 2018 American Medical Association. All rights reserved. The codes documented in this report are preliminary and upon coder review may  be revised to meet current compliance requirements. Lionel December, MD Lionel December, MD 08/25/2018 9:09:10 AM This report has been signed electronically. Number of Addenda: 0

## 2018-08-25 NOTE — Addendum Note (Signed)
Addendum  created 08/25/18 1010 by Moshe Salisbury, CRNA   Clinical Note Signed

## 2018-08-25 NOTE — Transfer of Care (Addendum)
Immediate Anesthesia Transfer of Care Note  Patient: [name]  Procedure(s) Performed: ESOPHAGOGASTRODUODENOSCOPY (EGD) WITH PROPOFOL (N/A ) ESOPHAGEAL DILATION (N/A ) BIOPSY  Patient Location: PACU  Anesthesia Type:General  Level of Consciousness: awake  Airway & Oxygen Therapy: Patient Spontanous Breathing  Post-op Assessment: Report given to RN  Post vital signs: Reviewed and stable  Last Vitals:  Vitals Value Taken Time  BP 110/54 08/25/2018  9:10 AM  Temp    Pulse 72 08/25/2018  9:13 AM  Resp 16 08/25/2018  9:13 AM  SpO2 96 % 08/25/2018  9:13 AM  Vitals shown include unvalidated device data.  Last Pain:  Vitals:   08/25/18 0841  TempSrc:   PainSc: 0-No pain         Complications: No apparent anesthesia complications

## 2018-08-30 ENCOUNTER — Other Ambulatory Visit: Payer: Self-pay

## 2018-08-30 ENCOUNTER — Other Ambulatory Visit (HOSPITAL_COMMUNITY): Payer: Self-pay | Admitting: Adult Health Nurse Practitioner

## 2018-08-30 ENCOUNTER — Encounter (HOSPITAL_COMMUNITY): Payer: Self-pay | Admitting: Internal Medicine

## 2018-08-30 ENCOUNTER — Ambulatory Visit (HOSPITAL_COMMUNITY)
Admission: RE | Admit: 2018-08-30 | Discharge: 2018-08-30 | Disposition: A | Discharge status: Home / Self Care | Payer: PPO | Source: Ambulatory Visit | Attending: Adult Health Nurse Practitioner | Admitting: Adult Health Nurse Practitioner

## 2018-08-30 DIAGNOSIS — R634 Abnormal weight loss: Secondary | ICD-10-CM | POA: Insufficient documentation

## 2018-08-30 DIAGNOSIS — R05 Cough: Secondary | ICD-10-CM | POA: Diagnosis not present

## 2018-09-05 DIAGNOSIS — E782 Mixed hyperlipidemia: Secondary | ICD-10-CM | POA: Diagnosis not present

## 2018-09-05 DIAGNOSIS — E785 Hyperlipidemia, unspecified: Secondary | ICD-10-CM | POA: Diagnosis not present

## 2018-09-05 DIAGNOSIS — F331 Major depressive disorder, recurrent, moderate: Secondary | ICD-10-CM | POA: Diagnosis not present

## 2018-09-05 DIAGNOSIS — R000 Tachycardia, unspecified: Secondary | ICD-10-CM | POA: Diagnosis not present

## 2018-09-05 DIAGNOSIS — G43019 Migraine without aura, intractable, without status migrainosus: Secondary | ICD-10-CM | POA: Diagnosis not present

## 2018-09-05 DIAGNOSIS — R7301 Impaired fasting glucose: Secondary | ICD-10-CM | POA: Diagnosis not present

## 2018-09-05 DIAGNOSIS — E039 Hypothyroidism, unspecified: Secondary | ICD-10-CM | POA: Diagnosis not present

## 2018-10-06 DIAGNOSIS — F331 Major depressive disorder, recurrent, moderate: Secondary | ICD-10-CM | POA: Diagnosis not present

## 2018-10-06 DIAGNOSIS — E039 Hypothyroidism, unspecified: Secondary | ICD-10-CM | POA: Diagnosis not present

## 2018-10-06 DIAGNOSIS — R000 Tachycardia, unspecified: Secondary | ICD-10-CM | POA: Diagnosis not present

## 2018-10-06 DIAGNOSIS — R7301 Impaired fasting glucose: Secondary | ICD-10-CM | POA: Diagnosis not present

## 2018-10-06 DIAGNOSIS — E785 Hyperlipidemia, unspecified: Secondary | ICD-10-CM | POA: Diagnosis not present

## 2018-10-06 DIAGNOSIS — G43019 Migraine without aura, intractable, without status migrainosus: Secondary | ICD-10-CM | POA: Diagnosis not present

## 2018-10-06 DIAGNOSIS — E782 Mixed hyperlipidemia: Secondary | ICD-10-CM | POA: Diagnosis not present

## 2018-10-13 DIAGNOSIS — R7301 Impaired fasting glucose: Secondary | ICD-10-CM | POA: Diagnosis not present

## 2018-10-13 DIAGNOSIS — E785 Hyperlipidemia, unspecified: Secondary | ICD-10-CM | POA: Diagnosis not present

## 2018-10-13 DIAGNOSIS — F331 Major depressive disorder, recurrent, moderate: Secondary | ICD-10-CM | POA: Diagnosis not present

## 2018-10-13 DIAGNOSIS — E039 Hypothyroidism, unspecified: Secondary | ICD-10-CM | POA: Diagnosis not present

## 2018-10-25 DIAGNOSIS — Z0000 Encounter for general adult medical examination without abnormal findings: Secondary | ICD-10-CM | POA: Diagnosis not present

## 2018-11-01 DIAGNOSIS — R7301 Impaired fasting glucose: Secondary | ICD-10-CM | POA: Diagnosis not present

## 2018-11-01 DIAGNOSIS — E785 Hyperlipidemia, unspecified: Secondary | ICD-10-CM | POA: Diagnosis not present

## 2018-11-01 DIAGNOSIS — E782 Mixed hyperlipidemia: Secondary | ICD-10-CM | POA: Diagnosis not present

## 2018-11-01 DIAGNOSIS — E039 Hypothyroidism, unspecified: Secondary | ICD-10-CM | POA: Diagnosis not present

## 2018-11-07 DIAGNOSIS — F331 Major depressive disorder, recurrent, moderate: Secondary | ICD-10-CM | POA: Diagnosis not present

## 2018-11-07 DIAGNOSIS — N183 Chronic kidney disease, stage 3 (moderate): Secondary | ICD-10-CM | POA: Diagnosis not present

## 2018-11-07 DIAGNOSIS — R634 Abnormal weight loss: Secondary | ICD-10-CM | POA: Diagnosis not present

## 2018-11-07 DIAGNOSIS — R7301 Impaired fasting glucose: Secondary | ICD-10-CM | POA: Diagnosis not present

## 2018-11-07 DIAGNOSIS — Z79891 Long term (current) use of opiate analgesic: Secondary | ICD-10-CM | POA: Diagnosis not present

## 2018-11-07 DIAGNOSIS — M797 Fibromyalgia: Secondary | ICD-10-CM | POA: Diagnosis not present

## 2018-11-07 DIAGNOSIS — K219 Gastro-esophageal reflux disease without esophagitis: Secondary | ICD-10-CM | POA: Diagnosis not present

## 2018-11-07 DIAGNOSIS — G43009 Migraine without aura, not intractable, without status migrainosus: Secondary | ICD-10-CM | POA: Diagnosis not present

## 2018-11-07 DIAGNOSIS — E785 Hyperlipidemia, unspecified: Secondary | ICD-10-CM | POA: Diagnosis not present

## 2018-11-07 DIAGNOSIS — G894 Chronic pain syndrome: Secondary | ICD-10-CM | POA: Diagnosis not present

## 2018-11-07 DIAGNOSIS — R000 Tachycardia, unspecified: Secondary | ICD-10-CM | POA: Diagnosis not present

## 2018-11-13 DIAGNOSIS — G43019 Migraine without aura, intractable, without status migrainosus: Secondary | ICD-10-CM | POA: Diagnosis not present

## 2018-11-13 DIAGNOSIS — E782 Mixed hyperlipidemia: Secondary | ICD-10-CM | POA: Diagnosis not present

## 2018-11-13 DIAGNOSIS — R000 Tachycardia, unspecified: Secondary | ICD-10-CM | POA: Diagnosis not present

## 2018-11-13 DIAGNOSIS — E039 Hypothyroidism, unspecified: Secondary | ICD-10-CM | POA: Diagnosis not present

## 2018-11-13 DIAGNOSIS — F331 Major depressive disorder, recurrent, moderate: Secondary | ICD-10-CM | POA: Diagnosis not present

## 2018-11-13 DIAGNOSIS — E785 Hyperlipidemia, unspecified: Secondary | ICD-10-CM | POA: Diagnosis not present

## 2018-11-13 DIAGNOSIS — R7301 Impaired fasting glucose: Secondary | ICD-10-CM | POA: Diagnosis not present

## 2018-12-18 DIAGNOSIS — F331 Major depressive disorder, recurrent, moderate: Secondary | ICD-10-CM | POA: Diagnosis not present

## 2018-12-18 DIAGNOSIS — G43019 Migraine without aura, intractable, without status migrainosus: Secondary | ICD-10-CM | POA: Diagnosis not present

## 2018-12-18 DIAGNOSIS — E039 Hypothyroidism, unspecified: Secondary | ICD-10-CM | POA: Diagnosis not present

## 2018-12-18 DIAGNOSIS — E782 Mixed hyperlipidemia: Secondary | ICD-10-CM | POA: Diagnosis not present

## 2018-12-18 DIAGNOSIS — R000 Tachycardia, unspecified: Secondary | ICD-10-CM | POA: Diagnosis not present

## 2018-12-18 DIAGNOSIS — R7301 Impaired fasting glucose: Secondary | ICD-10-CM | POA: Diagnosis not present

## 2018-12-18 DIAGNOSIS — E785 Hyperlipidemia, unspecified: Secondary | ICD-10-CM | POA: Diagnosis not present

## 2019-01-20 ENCOUNTER — Emergency Department (HOSPITAL_COMMUNITY)
Admission: EM | Admit: 2019-01-20 | Discharge: 2019-01-20 | Disposition: A | Discharge status: Home / Self Care | Payer: PPO

## 2019-01-22 ENCOUNTER — Other Ambulatory Visit (HOSPITAL_COMMUNITY): Payer: Self-pay | Admitting: Internal Medicine

## 2019-01-22 ENCOUNTER — Other Ambulatory Visit: Payer: Self-pay | Admitting: Internal Medicine

## 2019-01-22 DIAGNOSIS — Y92009 Unspecified place in unspecified non-institutional (private) residence as the place of occurrence of the external cause: Secondary | ICD-10-CM

## 2019-01-22 DIAGNOSIS — W19XXXA Unspecified fall, initial encounter: Secondary | ICD-10-CM

## 2019-01-22 DIAGNOSIS — Z9181 History of falling: Secondary | ICD-10-CM | POA: Diagnosis not present

## 2019-01-22 DIAGNOSIS — R51 Headache: Secondary | ICD-10-CM | POA: Diagnosis not present

## 2019-01-23 ENCOUNTER — Ambulatory Visit (HOSPITAL_COMMUNITY)
Admission: RE | Admit: 2019-01-23 | Discharge: 2019-01-23 | Disposition: A | Discharge status: Home / Self Care | Payer: PPO | Source: Ambulatory Visit | Attending: Internal Medicine | Admitting: Internal Medicine

## 2019-01-23 ENCOUNTER — Other Ambulatory Visit: Payer: Self-pay

## 2019-01-23 DIAGNOSIS — W19XXXA Unspecified fall, initial encounter: Secondary | ICD-10-CM | POA: Diagnosis not present

## 2019-01-23 DIAGNOSIS — R51 Headache: Secondary | ICD-10-CM | POA: Diagnosis not present

## 2019-01-23 DIAGNOSIS — Y92009 Unspecified place in unspecified non-institutional (private) residence as the place of occurrence of the external cause: Secondary | ICD-10-CM | POA: Diagnosis not present

## 2019-01-31 DIAGNOSIS — G43019 Migraine without aura, intractable, without status migrainosus: Secondary | ICD-10-CM | POA: Diagnosis not present

## 2019-01-31 DIAGNOSIS — R000 Tachycardia, unspecified: Secondary | ICD-10-CM | POA: Diagnosis not present

## 2019-01-31 DIAGNOSIS — E782 Mixed hyperlipidemia: Secondary | ICD-10-CM | POA: Diagnosis not present

## 2019-01-31 DIAGNOSIS — E039 Hypothyroidism, unspecified: Secondary | ICD-10-CM | POA: Diagnosis not present

## 2019-01-31 DIAGNOSIS — F331 Major depressive disorder, recurrent, moderate: Secondary | ICD-10-CM | POA: Diagnosis not present

## 2019-01-31 DIAGNOSIS — R7301 Impaired fasting glucose: Secondary | ICD-10-CM | POA: Diagnosis not present

## 2019-01-31 DIAGNOSIS — E785 Hyperlipidemia, unspecified: Secondary | ICD-10-CM | POA: Diagnosis not present

## 2019-02-09 ENCOUNTER — Telehealth: Payer: Self-pay | Admitting: *Deleted

## 2019-02-12 NOTE — Patient Outreach (Signed)
[facility]) Care Management  02/12/2019  SHRITHA BRESEE [birth year] [phone]

## 2019-02-15 NOTE — Patient Outreach (Signed)
[facility]) Care Management  02/15/2019  [name] 09-10-1945 [id number]   CSW received referral from Elmer at Dr. Juel Burrow office (ph#: [phone]) for assistance with food, bills, medications. Patient was referred to [facility] for medication assistance at Dr. Juel Burrow office. CSW called & spoke with patient regarding food resources and provided patient with the phone number to [employer] Whe[employer] through [employer] - explained the process of getting on their waitlist and then with COVID they're delivering 1-2 weeks worth of frozen meals at a time. Patient declined food pantry resources but was open to financial resources to help with medical & utility bills. CSW will mail patient resources & follow-up in 3 weeks to ensure that she has received them.    Raynaldo Opitz, LCSW [facility]  Clinical Social Worker cell #: [phone]

## 2019-02-23 DIAGNOSIS — G43019 Migraine without aura, intractable, without status migrainosus: Secondary | ICD-10-CM | POA: Diagnosis not present

## 2019-02-23 DIAGNOSIS — R7301 Impaired fasting glucose: Secondary | ICD-10-CM | POA: Diagnosis not present

## 2019-02-23 DIAGNOSIS — E782 Mixed hyperlipidemia: Secondary | ICD-10-CM | POA: Diagnosis not present

## 2019-02-23 DIAGNOSIS — E039 Hypothyroidism, unspecified: Secondary | ICD-10-CM | POA: Diagnosis not present

## 2019-02-23 DIAGNOSIS — R000 Tachycardia, unspecified: Secondary | ICD-10-CM | POA: Diagnosis not present

## 2019-02-23 DIAGNOSIS — E785 Hyperlipidemia, unspecified: Secondary | ICD-10-CM | POA: Diagnosis not present

## 2019-02-23 DIAGNOSIS — F331 Major depressive disorder, recurrent, moderate: Secondary | ICD-10-CM | POA: Diagnosis not present
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
## 2019-03-06 ENCOUNTER — Ambulatory Visit (INDEPENDENT_AMBULATORY_CARE_PROVIDER_SITE_OTHER): Payer: PPO | Admitting: Psychiatry

## 2019-03-06 ENCOUNTER — Other Ambulatory Visit: Payer: Self-pay

## 2019-03-06 ENCOUNTER — Encounter (HOSPITAL_COMMUNITY): Payer: Self-pay | Admitting: Psychiatry

## 2019-03-06 ENCOUNTER — Other Ambulatory Visit (HOSPITAL_COMMUNITY): Payer: Self-pay | Admitting: Psychiatry

## 2019-03-06 DIAGNOSIS — F330 Major depressive disorder, recurrent, mild: Secondary | ICD-10-CM | POA: Diagnosis not present

## 2019-03-06 DIAGNOSIS — F411 Generalized anxiety disorder: Secondary | ICD-10-CM

## 2019-03-06 MED ORDER — CHLORDIAZEPOXIDE HCL 25 MG PO CAPS: 25.0000 mg | ORAL_CAPSULE | Freq: Three times a day (TID) | ORAL | 1 refills | Status: DC

## 2019-03-06 MED ORDER — AMITRIPTYLINE HCL 50 MG PO TABS: 50.0000 mg | ORAL_TABLET | Freq: Two times a day (BID) | ORAL | 0 refills | Status: DC

## 2019-03-06 NOTE — Patient Instructions (Signed)
1. Increase amitriptyline 50 mg twice a day 2. Continue chlordiazepoxide 25 mg three times a day as needed for anxiety 3. Check blood test (TSH) 4. Next appointment: 10/22 at 2:30

## 2019-03-08 ENCOUNTER — Ambulatory Visit: Payer: Self-pay | Admitting: *Deleted

## 2019-03-19 DIAGNOSIS — R600 Localized edema: Secondary | ICD-10-CM | POA: Diagnosis not present

## 2019-03-19 DIAGNOSIS — N764 Abscess of vulva: Secondary | ICD-10-CM | POA: Diagnosis not present

## 2019-03-27 DIAGNOSIS — E039 Hypothyroidism, unspecified: Secondary | ICD-10-CM | POA: Diagnosis not present

## 2019-03-27 DIAGNOSIS — E782 Mixed hyperlipidemia: Secondary | ICD-10-CM | POA: Diagnosis not present

## 2019-03-27 DIAGNOSIS — R000 Tachycardia, unspecified: Secondary | ICD-10-CM | POA: Diagnosis not present

## 2019-03-27 DIAGNOSIS — E785 Hyperlipidemia, unspecified: Secondary | ICD-10-CM | POA: Diagnosis not present

## 2019-03-27 DIAGNOSIS — G43019 Migraine without aura, intractable, without status migrainosus: Secondary | ICD-10-CM | POA: Diagnosis not present

## 2019-03-27 DIAGNOSIS — R7301 Impaired fasting glucose: Secondary | ICD-10-CM | POA: Diagnosis not present

## 2019-03-27 DIAGNOSIS — F331 Major depressive disorder, recurrent, moderate: Secondary | ICD-10-CM | POA: Diagnosis not present
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
## 2019-04-05 ENCOUNTER — Other Ambulatory Visit: Payer: Self-pay

## 2019-04-05 ENCOUNTER — Ambulatory Visit (INDEPENDENT_AMBULATORY_CARE_PROVIDER_SITE_OTHER): Payer: PPO | Admitting: Psychiatry

## 2019-04-05 ENCOUNTER — Encounter (HOSPITAL_COMMUNITY): Payer: Self-pay | Admitting: Psychiatry

## 2019-04-05 DIAGNOSIS — F411 Generalized anxiety disorder: Secondary | ICD-10-CM | POA: Diagnosis not present

## 2019-04-05 DIAGNOSIS — F331 Major depressive disorder, recurrent, moderate: Secondary | ICD-10-CM

## 2019-04-05 MED ORDER — AMITRIPTYLINE HCL 75 MG PO TABS: 75.0000 mg | ORAL_TABLET | Freq: Two times a day (BID) | ORAL | 0 refills | Status: DC

## 2019-04-05 NOTE — Patient Instructions (Signed)
1. Increase amitriptyline 75 mg twice a day  2. Continue chlordiazepoxide 25 mg twice a day as needed for anxiety 3. Check blood test (TSH) 4. Next appointment: 11/17 at 2 PM
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
## 2019-04-28 DIAGNOSIS — R609 Edema, unspecified: Secondary | ICD-10-CM | POA: Diagnosis not present

## 2019-05-01 ENCOUNTER — Ambulatory Visit (INDEPENDENT_AMBULATORY_CARE_PROVIDER_SITE_OTHER): Payer: PPO | Admitting: Psychiatry

## 2019-05-01 ENCOUNTER — Encounter (HOSPITAL_COMMUNITY): Payer: Self-pay | Admitting: Psychiatry

## 2019-05-01 ENCOUNTER — Other Ambulatory Visit: Payer: Self-pay

## 2019-05-01 DIAGNOSIS — F411 Generalized anxiety disorder: Secondary | ICD-10-CM

## 2019-05-01 DIAGNOSIS — F330 Major depressive disorder, recurrent, mild: Secondary | ICD-10-CM

## 2019-05-01 MED ORDER — AMITRIPTYLINE HCL 75 MG PO TABS: 75.0000 mg | ORAL_TABLET | Freq: Two times a day (BID) | ORAL | 0 refills | Status: DC

## 2019-05-01 MED ORDER — CHLORDIAZEPOXIDE HCL 25 MG PO CAPS: 25.0000 mg | ORAL_CAPSULE | Freq: Two times a day (BID) | ORAL | 1 refills | Status: DC

## 2019-05-01 NOTE — Patient Instructions (Signed)
1. Continue amitriptyline75 mg twice a day  2. Decrease chlordiazepoxide 25 mg twice a day as needed for anxiety 3. Check blood test (TSH)  4. Next appointment: in two months

## 2019-05-02 DIAGNOSIS — E785 Hyperlipidemia, unspecified: Secondary | ICD-10-CM | POA: Diagnosis not present

## 2019-05-02 DIAGNOSIS — R000 Tachycardia, unspecified: Secondary | ICD-10-CM | POA: Diagnosis not present

## 2019-05-02 DIAGNOSIS — F331 Major depressive disorder, recurrent, moderate: Secondary | ICD-10-CM | POA: Diagnosis not present

## 2019-05-02 DIAGNOSIS — R7301 Impaired fasting glucose: Secondary | ICD-10-CM | POA: Diagnosis not present

## 2019-05-02 DIAGNOSIS — E039 Hypothyroidism, unspecified: Secondary | ICD-10-CM | POA: Diagnosis not present

## 2019-05-02 DIAGNOSIS — E782 Mixed hyperlipidemia: Secondary | ICD-10-CM | POA: Diagnosis not present

## 2019-05-02 DIAGNOSIS — G43019 Migraine without aura, intractable, without status migrainosus: Secondary | ICD-10-CM | POA: Diagnosis not present

## 2019-05-07 ENCOUNTER — Emergency Department (HOSPITAL_COMMUNITY): Payer: PPO

## 2019-05-07 ENCOUNTER — Other Ambulatory Visit: Payer: Self-pay

## 2019-05-07 ENCOUNTER — Emergency Department (HOSPITAL_COMMUNITY)
Admission: EM | Admit: 2019-05-07 | Discharge: 2019-05-07 | Disposition: A | Discharge status: Home / Self Care | Payer: PPO | Attending: Emergency Medicine | Admitting: Emergency Medicine

## 2019-05-07 ENCOUNTER — Encounter (HOSPITAL_COMMUNITY): Payer: Self-pay

## 2019-05-07 DIAGNOSIS — Z79899 Other long term (current) drug therapy: Secondary | ICD-10-CM | POA: Diagnosis not present

## 2019-05-07 DIAGNOSIS — F17210 Nicotine dependence, cigarettes, uncomplicated: Secondary | ICD-10-CM | POA: Insufficient documentation

## 2019-05-07 DIAGNOSIS — R5381 Other malaise: Secondary | ICD-10-CM | POA: Diagnosis not present

## 2019-05-07 DIAGNOSIS — T50901A Poisoning by unspecified drugs, medicaments and biological substances, accidental (unintentional), initial encounter: Secondary | ICD-10-CM | POA: Diagnosis not present

## 2019-05-07 DIAGNOSIS — R0689 Other abnormalities of breathing: Secondary | ICD-10-CM | POA: Diagnosis not present

## 2019-05-07 DIAGNOSIS — R531 Weakness: Secondary | ICD-10-CM | POA: Diagnosis not present

## 2019-05-07 DIAGNOSIS — E039 Hypothyroidism, unspecified: Secondary | ICD-10-CM | POA: Diagnosis not present

## 2019-05-07 DIAGNOSIS — R4020 Unspecified coma: Secondary | ICD-10-CM | POA: Diagnosis not present

## 2019-05-07 DIAGNOSIS — T402X1A Poisoning by other opioids, accidental (unintentional), initial encounter: Secondary | ICD-10-CM | POA: Diagnosis not present

## 2019-05-07 DIAGNOSIS — I959 Hypotension, unspecified: Secondary | ICD-10-CM | POA: Diagnosis not present

## 2019-05-07 DIAGNOSIS — R4781 Slurred speech: Secondary | ICD-10-CM | POA: Diagnosis not present

## 2019-05-07 DIAGNOSIS — T423X1A Poisoning by barbiturates, accidental (unintentional), initial encounter: Secondary | ICD-10-CM | POA: Diagnosis not present

## 2019-05-07 LAB — URINALYSIS, ROUTINE W REFLEX MICROSCOPIC
Bacteria, UA: NONE SEEN
Bilirubin Urine: NEGATIVE
Glucose, UA: NEGATIVE mg/dL
Ketones, ur: NEGATIVE mg/dL
Leukocytes,Ua: NEGATIVE
Nitrite: NEGATIVE
Protein, ur: NEGATIVE mg/dL
Specific Gravity, Urine: 1.019 (ref 1.005–1.030)
pH: 5 (ref 5.0–8.0)

## 2019-05-07 LAB — COMPREHENSIVE METABOLIC PANEL
ALT: 19 U/L (ref 0–44)
AST: 21 U/L (ref 15–41)
Albumin: 3.6 g/dL (ref 3.5–5.0)
Alkaline Phosphatase: 57 U/L (ref 38–126)
Anion gap: 4 — ABNORMAL LOW (ref 5–15)
BUN: 11 mg/dL (ref 8–23)
CO2: 26 mmol/L (ref 22–32)
Calcium: 8.3 mg/dL — ABNORMAL LOW (ref 8.9–10.3)
Chloride: 110 mmol/L (ref 98–111)
Creatinine, Ser: 0.85 mg/dL (ref 0.44–1.00)
GFR calc Af Amer: >60 mL/min (ref >60)
GFR calc non Af Amer: >60 mL/min (ref >60)
Glucose, Bld: 85 mg/dL (ref 70–99)
Potassium: 3.1 mmol/L — ABNORMAL LOW (ref 3.5–5.1)
Sodium: 140 mmol/L (ref 135–145)
Total Bilirubin: 0.4 mg/dL (ref 0.3–1.2)
Total Protein: 6.5 g/dL (ref 6.5–8.1)

## 2019-05-07 LAB — RAPID URINE DRUG SCREEN, HOSP PERFORMED
Amphetamines: NOT DETECTED
Barbiturates: POSITIVE — AB
Benzodiazepines: POSITIVE — AB
Cocaine: NOT DETECTED
Opiates: POSITIVE — AB
Tetrahydrocannabinol: NOT DETECTED

## 2019-05-07 LAB — CBC WITH DIFFERENTIAL/PLATELET
Abs Immature Granulocytes: 0.01 10*3/uL (ref 0.00–0.07)
Basophils Absolute: 0.1 10*3/uL (ref 0.0–0.1)
Basophils Relative: 1 %
Eosinophils Absolute: 0.3 10*3/uL (ref 0.0–0.5)
Eosinophils Relative: 4 %
HCT: 38.9 % (ref 36.0–46.0)
Hemoglobin: 12.9 g/dL (ref 12.0–15.0)
Immature Granulocytes: 0 %
Lymphocytes Relative: 34 %
Lymphs Abs: 2.3 10*3/uL (ref 0.7–4.0)
MCH: 33.9 pg (ref 26.0–34.0)
MCHC: 33.2 g/dL (ref 30.0–36.0)
MCV: 102.1 fL — ABNORMAL HIGH (ref 80.0–100.0)
Monocytes Absolute: 0.7 10*3/uL (ref 0.1–1.0)
Monocytes Relative: 10 %
Neutro Abs: 3.4 10*3/uL (ref 1.7–7.7)
Neutrophils Relative %: 51 %
Platelets: 191 10*3/uL (ref 150–400)
RBC: 3.81 MIL/uL — ABNORMAL LOW (ref 3.87–5.11)
RDW: 13.2 % (ref 11.5–15.5)
WBC: 6.7 10*3/uL (ref 4.0–10.5)
nRBC: 0 % (ref 0.0–0.2)

## 2019-05-07 LAB — ETHANOL: Alcohol, Ethyl (B): <10 mg/dL (ref <10)

## 2019-05-07 LAB — AMMONIA: Ammonia: 36 umol/L — ABNORMAL HIGH (ref 9–35)

## 2019-05-07 LAB — LACTIC ACID, PLASMA: Lactic Acid, Venous: 1 mmol/L (ref 0.5–1.9)

## 2019-05-07 LAB — ACETAMINOPHEN LEVEL: Acetaminophen (Tylenol), Serum: 21 ug/mL (ref 10–30)

## 2019-05-07 MED ORDER — SODIUM CHLORIDE 0.9 % IV BOLUS
1000.0000 mL | Freq: Once | INTRAVENOUS | Status: AC
  Administered 2019-05-07: 1000 mL via INTRAVENOUS

## 2019-05-07 MED ORDER — POTASSIUM CHLORIDE CRYS ER 20 MEQ PO TBCR
40.0000 meq | EXTENDED_RELEASE_TABLET | Freq: Once | ORAL | Status: AC
  Administered 2019-05-07: 21:00:00 40 meq via ORAL
  Filled 2019-05-07: qty 2

## 2019-05-07 NOTE — Discharge Instructions (Addendum)
Stop taking your oxycodone and fioricet.  Call your doctor tomorrow to be seen by next week.

## 2019-05-07 NOTE — ED Provider Notes (Signed)
[facility] EMERGENCY DEPARTMENT Provider Note   CSN: [id number] Arrival date & time: 05/07/19  1524     History   Chief Complaint Chief Complaint  Patient presents with  . Aphasia    HPI [name] is a 73 y.o. female.     Patient arrived in the emergency department lethargic with slurred speech.  Her sister states that she takes too much of her medicines.  This is been going on for a week.  It appears she is taken about 60 Fioricets over a 5-day.  She is also on oxycodone and Librium  The history is provided by the patient and a relative. No language interpreter was used.  Altered Mental Status Presenting symptoms: behavior changes   Severity:  Severe Most recent episode:  More than 2 days ago Episode history:  Multiple Timing:  Constant Progression:  Improving Chronicity:  Chronic Context: not alcohol use   Associated symptoms: no abdominal pain, no hallucinations, no headaches, no rash and no seizures     Past Medical History:  Diagnosis Date  . Anxiety   . Back pain   . Fibromyalgia   . Hyperlipemia   . Hypothyroidism   . Neuropathy   . Sinusitis   . Stroke [facility](HCC)    no deficits    Patient Active Problem List   Diagnosis Date Noted  . Esophageal dysphagia 08/10/2018  . Back pain 05/05/2012  . Fibromyalgia 05/05/2012  . Chest pressure 04/03/2012  . Carotid bruit 04/03/2012  . Anxiety 10/05/2011    Past Surgical History:  Procedure Laterality Date  . ABDOMINAL HYSTERECTOMY    . BIOPSY  08/25/2018   Procedure: BIOPSY;  Surgeon: Malissa Hippoehman, Najeeb U, MD;  [facility];  Service: Endoscopy;;  . CATARACT EXTRACTION Bilateral   . CHOLECYSTECTOMY    . ESOPHAGEAL DILATION N/A 06/27/2015   Procedure: ESOPHAGEAL DILATION;  Surgeon: Malissa HippoNajeeb U Rehman, MD;  [facility];  Service: Endoscopy;  Laterality: N/A;  . ESOPHAGEAL DILATION N/A 08/25/2018   Procedure: ESOPHAGEAL DILATION;  Surgeon: Malissa Hippoehman, Najeeb U, MD;  [facility];   Service: Endoscopy;  Laterality: N/A;  . ESOPHAGOGASTRODUODENOSCOPY (EGD) WITH PROPOFOL N/A 06/27/2015   Procedure: ESOPHAGOGASTRODUODENOSCOPY (EGD) WITH PROPOFOL;  Surgeon: Malissa HippoNajeeb U Rehman, MD;  [facility];  Service: Endoscopy;  Laterality: N/A;  . ESOPHAGOGASTRODUODENOSCOPY (EGD) WITH PROPOFOL N/A 08/25/2018   Procedure: ESOPHAGOGASTRODUODENOSCOPY (EGD) WITH PROPOFOL;  Surgeon: Malissa Hippoehman, Najeeb U, MD;  [facility];  Service: Endoscopy;  Laterality: N/A;  9:30     OB History   No obstetric history on file.      Home Medications    Prior to Admission medications   Medication Sig Start Date End Date Taking? Authorizing Provider  amitriptyline (ELAVIL) 75 MG tablet Take 1 tablet (75 mg total) by mouth 2 (two) times daily. 07/02/19  Yes Hisada, Barbee Cougheina, MD  butalbital-acetaminophen-caffeine (FIORICET, ESGIC) 50-325-40 MG per tablet Take 3 tablets by mouth 3 (three) times daily as needed for headache or migraine.    Yes [provider]  chlordiazePOXIDE (LIBRIUM) 25 MG capsule Take 1 capsule (25 mg total) by mouth 2 (two) times daily. 05/01/19  Yes Neysa HotterHisada, Reina, MD  cyclobenzaprine (FLEXERIL) 10 MG tablet Take 10 mg by mouth 3 (three) times daily as needed for muscle spasms.  12/19/12  Yes [provider]  furosemide (LASIX) 40 MG tablet Take 40 mg by mouth daily. 04/28/19  Yes [provider]  gabapentin (NEURONTIN) 100 MG  capsule Take 100 mg by mouth 3 (three) times daily.    Yes [provider]  meclizine (ANTIVERT) 25 MG tablet Take 25 mg by mouth 3 (three) times daily as needed for dizziness.   Yes [provider]  metoCLOPramide (REGLAN) 5 MG tablet Take 5 mg by mouth every 8 (eight) hours as needed for nausea.    Yes [provider]  Oxycodone HCl 10 MG TABS Take 10-20 mg by mouth every 4 (four) hours.  02/26/13  Yes [provider]  pantoprazole (PROTONIX) 40 MG tablet Take 1 tablet (40 mg total) by mouth  daily before breakfast. 08/25/18  Yes Rehman, Joline Maxcy, MD  propranolol (INDERAL) 40 MG tablet Take 40 mg by mouth 2 (two) times daily.    Yes [provider]  simvastatin (ZOCOR) 40 MG tablet Take 40 mg by mouth at bedtime.  04/29/14  Yes [provider]    Family History Family History  Problem Relation Age of Onset  . Depression Mother   . Depression Father   . Depression Sister   . Anxiety disorder Sister   . Anxiety disorder Sister   . Alcohol abuse Neg Hx   . Drug abuse Neg Hx     Social History Social History   Tobacco Use  . Smoking status: Current Every Day Smoker    Packs/day: 0.25    Years: 50.00    Pack years: 12.50    Types: Cigarettes  . Smokeless tobacco: Never Used  . Tobacco comment: smokes 1/2 pack every 2 day.  Substance Use Topics  . Alcohol use: No    Alcohol/week: 0.0 standard drinks  . Drug use: No     Allergies   Codeine and Prednisone   Review of Systems Review of Systems  Constitutional: Negative for appetite change and fatigue.       Weakness  HENT: Negative for congestion, ear discharge and sinus pressure.   Eyes: Negative for discharge.  Respiratory: Negative for cough.   Cardiovascular: Negative for chest pain.  Gastrointestinal: Negative for abdominal pain and diarrhea.  Genitourinary: Negative for frequency and hematuria.  Musculoskeletal: Negative for back pain.  Skin: Negative for rash.  Neurological: Negative for seizures and headaches.  Psychiatric/Behavioral: Negative for hallucinations.     Physical Exam Updated Vital Signs BP (!) 140/57   Pulse 67   Temp 98.4 F (36.9 C) (Oral)   Resp 12   Wt 53.6 kg   SpO2 98%   BMI 20.93 kg/m   Physical Exam Vitals signs and nursing note reviewed.  Constitutional:      Appearance: She is well-developed.     Comments: Patient lethargic  HENT:     Head: Normocephalic.  Eyes:     General: No scleral icterus.    Conjunctiva/sclera: Conjunctivae normal.   Neck:     Musculoskeletal: Neck supple.     Thyroid: No thyromegaly.  Cardiovascular:     Rate and Rhythm: Normal rate and regular rhythm.     Heart sounds: No murmur. No friction rub. No gallop.   Pulmonary:     Breath sounds: No stridor. No wheezing or rales.  Chest:     Chest wall: No tenderness.  Abdominal:     General: There is no distension.     Tenderness: There is no abdominal tenderness. There is no rebound.  Musculoskeletal: Normal range of motion.  Lymphadenopathy:     Cervical: No cervical adenopathy.  Skin:    Findings: No erythema or  rash.  Neurological:     Motor: No abnormal muscle tone.     Coordination: Coordination normal.     Comments: Patient very lethargic  Psychiatric:        Behavior: Behavior normal.      ED Treatments / Results  Labs (all labs ordered are listed, but only abnormal results are displayed) Labs Reviewed  CBC WITH DIFFERENTIAL/PLATELET - Abnormal; Notable for the following components:      Result Value   RBC 3.81 (*)    MCV 102.1 (*)    All other components within normal limits  COMPREHENSIVE METABOLIC PANEL - Abnormal; Notable for the following components:   Potassium 3.1 (*)    Calcium 8.3 (*)    Anion gap 4 (*)    All other components within normal limits  AMMONIA - Abnormal; Notable for the following components:   Ammonia 36 (*)    All other components within normal limits  URINALYSIS, ROUTINE W REFLEX MICROSCOPIC - Abnormal; Notable for the following components:   Hgb urine dipstick SMALL (*)    All other components within normal limits  RAPID URINE DRUG SCREEN, HOSP PERFORMED - Abnormal; Notable for the following components:   Opiates POSITIVE (*)    Benzodiazepines POSITIVE (*)    Barbiturates POSITIVE (*)    All other components within normal limits  LACTIC ACID, PLASMA  ETHANOL  ACETAMINOPHEN LEVEL    EKG None  Radiology Dg Chest 1 View  Result Date: 05/07/2019 CLINICAL DATA:  Weakness slurred speech  EXAM: CHEST  1 VIEW COMPARISON:  08/30/2018 FINDINGS: Heart size upper normal. Negative for heart failure. Atherosclerotic aorta. Lungs clear without infiltrate or effusion. IMPRESSION: No active disease. Electronically Signed   By: Marlan Palau M.D.   On: 05/07/2019 18:33   Ct Head Wo Contrast  Result Date: 05/07/2019 CLINICAL DATA:  Altered level of consciousness. Slurred speech 5 days. EXAM: CT HEAD WITHOUT CONTRAST TECHNIQUE: Contiguous axial images were obtained from the base of the skull through the vertex without intravenous contrast. COMPARISON:  CT head 01/23/2019 FINDINGS: Brain: Ventricle size normal. Hypodensity in the frontal white matter bilaterally is stable since the prior study. No acute infarct, hemorrhage, mass. Vascular: Negative for hyperdense vessel Skull: Negative Sinuses/Orbits: Negative Other: None IMPRESSION: No acute intracranial abnormality. Mild chronic microvascular ischemic changes in the white matter Electronically Signed   By: Marlan Palau M.D.   On: 05/07/2019 18:20    Procedures Procedures (including critical care time)  Medications Ordered in ED Medications  sodium chloride 0.9 % bolus 1,000 mL (0 mLs Intravenous Stopped 05/07/19 1915)  potassium chloride SA (KLOR-CON) CR tablet 40 mEq (40 mEq Oral Given 05/07/19 2043)     Initial Impression / Assessment and Plan / ED Course  I have reviewed the triage vital signs and the nursing notes.  Pertinent labs & imaging results that were available during my care of the patient were reviewed by me and considered in my medical decision making (see chart for details).        Labs unremarkable.  Patient improved over time.  She was positive for benzos opiates and barbiturates.  She has prescriptions for all these medicines.  It looks like she is taken 60 of the Fioricet over 5 days.  I suspect patient slurred speech and confusion is related to all the medication she is taken.  I told her to stop taking the  Fioricet and oxycodone and to follow-up with her primary care doctor to regulate her  medicines better.  Final Clinical Impressions(s) / ED Diagnoses   Final diagnoses:  Accidental drug overdose, initial encounter    ED Discharge Orders    None       Milton Ferguson, MD 05/07/19 2148

## 2019-05-07 NOTE — ED Notes (Signed)
Pt and family expressed understanding of discharge instructions and follow up, pt assisted to wheelchair, tolerated well and discharged

## 2019-05-07 NOTE — ED Triage Notes (Signed)
Pt has had slurred speech for 5 days. Pupils pinpoint. Has been moving slower than normal. Per family, pt sometimes takes more medication than she should by mistake. Takes Librium, Amitriptyline, oxycodone, gabapentin, and meclizine.

## 2019-05-16 DIAGNOSIS — R269 Unspecified abnormalities of gait and mobility: Secondary | ICD-10-CM | POA: Diagnosis not present

## 2019-05-16 DIAGNOSIS — E039 Hypothyroidism, unspecified: Secondary | ICD-10-CM | POA: Diagnosis not present

## 2019-05-16 DIAGNOSIS — N390 Urinary tract infection, site not specified: Secondary | ICD-10-CM | POA: Diagnosis not present

## 2019-05-16 DIAGNOSIS — R7301 Impaired fasting glucose: Secondary | ICD-10-CM | POA: Diagnosis not present

## 2019-05-16 DIAGNOSIS — N183 Chronic kidney disease, stage 3 unspecified: Secondary | ICD-10-CM | POA: Diagnosis not present

## 2019-05-16 DIAGNOSIS — Z79891 Long term (current) use of opiate analgesic: Secondary | ICD-10-CM | POA: Diagnosis not present

## 2019-05-16 DIAGNOSIS — G43009 Migraine without aura, not intractable, without status migrainosus: Secondary | ICD-10-CM | POA: Diagnosis not present

## 2019-05-16 DIAGNOSIS — G3184 Mild cognitive impairment, so stated: Secondary | ICD-10-CM | POA: Diagnosis not present

## 2019-05-16 DIAGNOSIS — G894 Chronic pain syndrome: Secondary | ICD-10-CM | POA: Diagnosis not present

## 2019-05-16 DIAGNOSIS — E782 Mixed hyperlipidemia: Secondary | ICD-10-CM | POA: Diagnosis not present

## 2019-05-16 DIAGNOSIS — E785 Hyperlipidemia, unspecified: Secondary | ICD-10-CM | POA: Diagnosis not present

## 2019-05-18 DIAGNOSIS — F17210 Nicotine dependence, cigarettes, uncomplicated: Secondary | ICD-10-CM | POA: Diagnosis not present

## 2019-05-18 DIAGNOSIS — Z9181 History of falling: Secondary | ICD-10-CM | POA: Diagnosis not present

## 2019-05-18 DIAGNOSIS — G43009 Migraine without aura, not intractable, without status migrainosus: Secondary | ICD-10-CM | POA: Diagnosis not present

## 2019-05-18 DIAGNOSIS — R269 Unspecified abnormalities of gait and mobility: Secondary | ICD-10-CM | POA: Diagnosis not present

## 2019-05-18 DIAGNOSIS — N390 Urinary tract infection, site not specified: Secondary | ICD-10-CM | POA: Diagnosis not present

## 2019-05-18 DIAGNOSIS — R52 Pain, unspecified: Secondary | ICD-10-CM | POA: Diagnosis not present

## 2019-05-18 DIAGNOSIS — G894 Chronic pain syndrome: Secondary | ICD-10-CM | POA: Diagnosis not present

## 2019-05-18 DIAGNOSIS — R5381 Other malaise: Secondary | ICD-10-CM | POA: Diagnosis not present

## 2019-05-18 DIAGNOSIS — R4182 Altered mental status, unspecified: Secondary | ICD-10-CM | POA: Diagnosis not present

## 2019-05-24 DIAGNOSIS — G43009 Migraine without aura, not intractable, without status migrainosus: Secondary | ICD-10-CM | POA: Diagnosis not present

## 2019-05-24 DIAGNOSIS — T50901D Poisoning by unspecified drugs, medicaments and biological substances, accidental (unintentional), subsequent encounter: Secondary | ICD-10-CM | POA: Diagnosis not present

## 2019-05-24 DIAGNOSIS — E870 Hyperosmolality and hypernatremia: Secondary | ICD-10-CM | POA: Diagnosis not present

## 2019-05-24 DIAGNOSIS — G3184 Mild cognitive impairment, so stated: Secondary | ICD-10-CM | POA: Diagnosis not present

## 2019-05-24 DIAGNOSIS — Z79891 Long term (current) use of opiate analgesic: Secondary | ICD-10-CM | POA: Diagnosis not present

## 2019-05-24 DIAGNOSIS — D72829 Elevated white blood cell count, unspecified: Secondary | ICD-10-CM | POA: Diagnosis not present

## 2019-05-24 DIAGNOSIS — R269 Unspecified abnormalities of gait and mobility: Secondary | ICD-10-CM | POA: Diagnosis not present

## 2019-05-24 DIAGNOSIS — N390 Urinary tract infection, site not specified: Secondary | ICD-10-CM | POA: Diagnosis not present

## 2019-05-24 DIAGNOSIS — N1832 Chronic kidney disease, stage 3b: Secondary | ICD-10-CM | POA: Diagnosis not present

## 2019-05-24 DIAGNOSIS — F419 Anxiety disorder, unspecified: Secondary | ICD-10-CM | POA: Diagnosis not present

## 2019-05-24 DIAGNOSIS — G609 Hereditary and idiopathic neuropathy, unspecified: Secondary | ICD-10-CM | POA: Diagnosis not present

## 2019-05-24 DIAGNOSIS — G894 Chronic pain syndrome: Secondary | ICD-10-CM | POA: Diagnosis not present

## 2019-05-27 ENCOUNTER — Other Ambulatory Visit: Payer: Self-pay

## 2019-05-27 ENCOUNTER — Emergency Department (HOSPITAL_COMMUNITY): Payer: PPO

## 2019-05-27 ENCOUNTER — Encounter (HOSPITAL_COMMUNITY): Payer: Self-pay | Admitting: Emergency Medicine

## 2019-05-27 ENCOUNTER — Inpatient Hospital Stay (HOSPITAL_COMMUNITY)
Admission: EM | Admit: 2019-05-27 | Discharge: 2019-05-29 | DRG: 176 | Disposition: A | Discharge status: Home / Self Care | Payer: PPO | Attending: Hospitalist | Admitting: Hospitalist

## 2019-05-27 DIAGNOSIS — R0602 Shortness of breath: Secondary | ICD-10-CM

## 2019-05-27 DIAGNOSIS — G43019 Migraine without aura, intractable, without status migrainosus: Secondary | ICD-10-CM | POA: Diagnosis not present

## 2019-05-27 DIAGNOSIS — E876 Hypokalemia: Secondary | ICD-10-CM | POA: Diagnosis not present

## 2019-05-27 DIAGNOSIS — E785 Hyperlipidemia, unspecified: Secondary | ICD-10-CM | POA: Diagnosis present

## 2019-05-27 DIAGNOSIS — R000 Tachycardia, unspecified: Secondary | ICD-10-CM | POA: Diagnosis not present

## 2019-05-27 DIAGNOSIS — F331 Major depressive disorder, recurrent, moderate: Secondary | ICD-10-CM | POA: Diagnosis not present

## 2019-05-27 DIAGNOSIS — I2699 Other pulmonary embolism without acute cor pulmonale: Principal | ICD-10-CM | POA: Diagnosis present

## 2019-05-27 DIAGNOSIS — Z8673 Personal history of transient ischemic attack (TIA), and cerebral infarction without residual deficits: Secondary | ICD-10-CM

## 2019-05-27 DIAGNOSIS — Z90710 Acquired absence of both cervix and uterus: Secondary | ICD-10-CM

## 2019-05-27 DIAGNOSIS — M797 Fibromyalgia: Secondary | ICD-10-CM | POA: Diagnosis not present

## 2019-05-27 DIAGNOSIS — Z888 Allergy status to other drugs, medicaments and biological substances status: Secondary | ICD-10-CM

## 2019-05-27 DIAGNOSIS — Z23 Encounter for immunization: Secondary | ICD-10-CM | POA: Diagnosis not present

## 2019-05-27 DIAGNOSIS — D72829 Elevated white blood cell count, unspecified: Secondary | ICD-10-CM | POA: Diagnosis not present

## 2019-05-27 DIAGNOSIS — I959 Hypotension, unspecified: Secondary | ICD-10-CM | POA: Diagnosis present

## 2019-05-27 DIAGNOSIS — N179 Acute kidney failure, unspecified: Secondary | ICD-10-CM | POA: Diagnosis not present

## 2019-05-27 DIAGNOSIS — G8929 Other chronic pain: Secondary | ICD-10-CM | POA: Diagnosis not present

## 2019-05-27 DIAGNOSIS — Z20828 Contact with and (suspected) exposure to other viral communicable diseases: Secondary | ICD-10-CM | POA: Diagnosis not present

## 2019-05-27 DIAGNOSIS — F419 Anxiety disorder, unspecified: Secondary | ICD-10-CM | POA: Diagnosis present

## 2019-05-27 DIAGNOSIS — R0781 Pleurodynia: Secondary | ICD-10-CM | POA: Diagnosis not present

## 2019-05-27 DIAGNOSIS — Z79899 Other long term (current) drug therapy: Secondary | ICD-10-CM | POA: Diagnosis not present

## 2019-05-27 DIAGNOSIS — Z8249 Family history of ischemic heart disease and other diseases of the circulatory system: Secondary | ICD-10-CM | POA: Diagnosis not present

## 2019-05-27 DIAGNOSIS — Z9842 Cataract extraction status, left eye: Secondary | ICD-10-CM

## 2019-05-27 DIAGNOSIS — Z9049 Acquired absence of other specified parts of digestive tract: Secondary | ICD-10-CM | POA: Diagnosis not present

## 2019-05-27 DIAGNOSIS — G629 Polyneuropathy, unspecified: Secondary | ICD-10-CM | POA: Diagnosis present

## 2019-05-27 DIAGNOSIS — R0789 Other chest pain: Secondary | ICD-10-CM | POA: Diagnosis not present

## 2019-05-27 DIAGNOSIS — R7301 Impaired fasting glucose: Secondary | ICD-10-CM | POA: Diagnosis not present

## 2019-05-27 DIAGNOSIS — R002 Palpitations: Secondary | ICD-10-CM | POA: Diagnosis not present

## 2019-05-27 DIAGNOSIS — R079 Chest pain, unspecified: Secondary | ICD-10-CM | POA: Diagnosis not present

## 2019-05-27 DIAGNOSIS — F17210 Nicotine dependence, cigarettes, uncomplicated: Secondary | ICD-10-CM | POA: Diagnosis present

## 2019-05-27 DIAGNOSIS — E039 Hypothyroidism, unspecified: Secondary | ICD-10-CM | POA: Diagnosis not present

## 2019-05-27 DIAGNOSIS — Z79891 Long term (current) use of opiate analgesic: Secondary | ICD-10-CM

## 2019-05-27 DIAGNOSIS — Z9841 Cataract extraction status, right eye: Secondary | ICD-10-CM

## 2019-05-27 DIAGNOSIS — Z885 Allergy status to narcotic agent status: Secondary | ICD-10-CM | POA: Diagnosis not present

## 2019-05-27 DIAGNOSIS — E782 Mixed hyperlipidemia: Secondary | ICD-10-CM | POA: Diagnosis not present

## 2019-05-27 LAB — COMPREHENSIVE METABOLIC PANEL
ALT: 24 U/L (ref 0–44)
AST: 26 U/L (ref 15–41)
Albumin: 4 g/dL (ref 3.5–5.0)
Alkaline Phosphatase: 54 U/L (ref 38–126)
Anion gap: 13 (ref 5–15)
BUN: 21 mg/dL (ref 8–23)
CO2: 30 mmol/L (ref 22–32)
Calcium: 8.7 mg/dL — ABNORMAL LOW (ref 8.9–10.3)
Chloride: 97 mmol/L — ABNORMAL LOW (ref 98–111)
Creatinine, Ser: 1.23 mg/dL — ABNORMAL HIGH (ref 0.44–1.00)
GFR calc Af Amer: 50 mL/min — ABNORMAL LOW (ref >60)
GFR calc non Af Amer: 43 mL/min — ABNORMAL LOW (ref >60)
Glucose, Bld: 125 mg/dL — ABNORMAL HIGH (ref 70–99)
Potassium: 3.1 mmol/L — ABNORMAL LOW (ref 3.5–5.1)
Sodium: 140 mmol/L (ref 135–145)
Total Bilirubin: 0.7 mg/dL (ref 0.3–1.2)
Total Protein: 7.3 g/dL (ref 6.5–8.1)

## 2019-05-27 LAB — CBC WITH DIFFERENTIAL/PLATELET
Abs Immature Granulocytes: 0.03 10*3/uL (ref 0.00–0.07)
Basophils Absolute: 0.1 10*3/uL (ref 0.0–0.1)
Basophils Relative: 0 %
Eosinophils Absolute: 0.1 10*3/uL (ref 0.0–0.5)
Eosinophils Relative: 1 %
HCT: 41.5 % (ref 36.0–46.0)
Hemoglobin: 14 g/dL (ref 12.0–15.0)
Immature Granulocytes: 0 %
Lymphocytes Relative: 13 %
Lymphs Abs: 1.8 10*3/uL (ref 0.7–4.0)
MCH: 33.3 pg (ref 26.0–34.0)
MCHC: 33.7 g/dL (ref 30.0–36.0)
MCV: 98.6 fL (ref 80.0–100.0)
Monocytes Absolute: 1 10*3/uL (ref 0.1–1.0)
Monocytes Relative: 7 %
Neutro Abs: 11 10*3/uL — ABNORMAL HIGH (ref 1.7–7.7)
Neutrophils Relative %: 79 %
Platelets: 222 10*3/uL (ref 150–400)
RBC: 4.21 MIL/uL (ref 3.87–5.11)
RDW: 12.2 % (ref 11.5–15.5)
WBC: 14 10*3/uL — ABNORMAL HIGH (ref 4.0–10.5)
nRBC: 0 % (ref 0.0–0.2)

## 2019-05-27 LAB — TROPONIN I (HIGH SENSITIVITY)
Troponin I (High Sensitivity): 6 ng/L (ref <18)
Troponin I (High Sensitivity): 6 ng/L (ref <18)

## 2019-05-27 LAB — MAGNESIUM: Magnesium: 1.7 mg/dL (ref 1.7–2.4)

## 2019-05-27 LAB — POC SARS CORONAVIRUS 2 AG -  ED: SARS Coronavirus 2 Ag: NEGATIVE

## 2019-05-27 LAB — D-DIMER, QUANTITATIVE: D-Dimer, Quant: 0.64 ug/mL-FEU — ABNORMAL HIGH (ref 0.00–0.50)

## 2019-05-27 MED ORDER — ONDANSETRON HCL 4 MG/2ML IJ SOLN
4.0000 mg | Freq: Once | INTRAMUSCULAR | Status: AC
  Administered 2019-05-27: 4 mg via INTRAVENOUS
  Filled 2019-05-27: qty 2

## 2019-05-27 MED ORDER — AMITRIPTYLINE HCL 25 MG PO TABS
75.0000 mg | ORAL_TABLET | Freq: Two times a day (BID) | ORAL | Status: DC
  Administered 2019-05-28 – 2019-05-29 (×3): 75 mg via ORAL
  Filled 2019-05-27 (×3): qty 3

## 2019-05-27 MED ORDER — SODIUM CHLORIDE 0.9 % IV BOLUS
1000.0000 mL | Freq: Once | INTRAVENOUS | Status: AC
  Administered 2019-05-27: 1000 mL via INTRAVENOUS

## 2019-05-27 MED ORDER — ONDANSETRON HCL 4 MG PO TABS: 4.0000 mg | ORAL_TABLET | Freq: Four times a day (QID) | ORAL | Status: DC | PRN

## 2019-05-27 MED ORDER — INFLUENZA VAC A&B SA ADJ QUAD 0.5 ML IM PRSY
0.5000 mL | PREFILLED_SYRINGE | INTRAMUSCULAR | Status: AC
  Administered 2019-05-29: 0.5 mL via INTRAMUSCULAR
  Filled 2019-05-27: qty 0.5

## 2019-05-27 MED ORDER — PNEUMOCOCCAL VAC POLYVALENT 25 MCG/0.5ML IJ INJ
0.5000 mL | INJECTION | INTRAMUSCULAR | Status: AC
  Administered 2019-05-29: 0.5 mL via INTRAMUSCULAR
  Filled 2019-05-27: qty 0.5

## 2019-05-27 MED ORDER — ACETAMINOPHEN 325 MG PO TABS
650.0000 mg | ORAL_TABLET | Freq: Four times a day (QID) | ORAL | Status: DC | PRN
  Administered 2019-05-28: 650 mg via ORAL
  Filled 2019-05-27: qty 2

## 2019-05-27 MED ORDER — POTASSIUM CHLORIDE 20 MEQ PO PACK
40.0000 meq | PACK | Freq: Once | ORAL | Status: AC
  Administered 2019-05-27: 40 meq via ORAL
  Filled 2019-05-27: qty 2

## 2019-05-27 MED ORDER — IOHEXOL 350 MG/ML SOLN
100.0000 mL | Freq: Once | INTRAVENOUS | Status: AC | PRN
  Administered 2019-05-27: 100 mL via INTRAVENOUS

## 2019-05-27 MED ORDER — POTASSIUM CHLORIDE IN NACL 40-0.9 MEQ/L-% IV SOLN
INTRAVENOUS | Status: AC
  Administered 2019-05-27: 100 mL/h via INTRAVENOUS
  Filled 2019-05-27 (×4): qty 1000

## 2019-05-27 MED ORDER — SIMVASTATIN 20 MG PO TABS
40.0000 mg | ORAL_TABLET | Freq: Every day | ORAL | Status: DC
  Administered 2019-05-28: 40 mg via ORAL
  Filled 2019-05-27: qty 2

## 2019-05-27 MED ORDER — HYDROMORPHONE HCL 1 MG/ML IJ SOLN
0.5000 mg | Freq: Once | INTRAMUSCULAR | Status: AC
  Administered 2019-05-27: 0.5 mg via INTRAVENOUS
  Filled 2019-05-27: qty 1

## 2019-05-27 MED ORDER — HEPARIN BOLUS VIA INFUSION
3000.0000 [IU] | Freq: Once | INTRAVENOUS | Status: AC
  Administered 2019-05-27: 17:00:00 3000 [IU] via INTRAVENOUS

## 2019-05-27 MED ORDER — ONDANSETRON HCL 4 MG/2ML IJ SOLN
4.0000 mg | Freq: Four times a day (QID) | INTRAMUSCULAR | Status: DC | PRN
  Administered 2019-05-28: 4 mg via INTRAVENOUS
  Filled 2019-05-27: qty 2

## 2019-05-27 MED ORDER — CYCLOBENZAPRINE HCL 10 MG PO TABS
10.0000 mg | ORAL_TABLET | Freq: Three times a day (TID) | ORAL | Status: DC | PRN
  Administered 2019-05-28: 10 mg via ORAL
  Filled 2019-05-27: qty 1

## 2019-05-27 MED ORDER — PANTOPRAZOLE SODIUM 40 MG PO TBEC
40.0000 mg | DELAYED_RELEASE_TABLET | Freq: Every day | ORAL | Status: DC
  Administered 2019-05-28 – 2019-05-29 (×2): 40 mg via ORAL
  Filled 2019-05-27 (×2): qty 1

## 2019-05-27 MED ORDER — HEPARIN (PORCINE) 25000 UT/250ML-% IV SOLN
700.0000 [IU]/h | INTRAVENOUS | Status: DC
  Administered 2019-05-27: 17:00:00 1000 [IU]/h via INTRAVENOUS
  Filled 2019-05-27: qty 250

## 2019-05-27 MED ORDER — SODIUM CHLORIDE 0.9 % IV BOLUS: 500.0000 mL | Freq: Once | INTRAVENOUS | Status: DC

## 2019-05-27 MED ORDER — POLYETHYLENE GLYCOL 3350 17 G PO PACK: 17.0000 g | PACK | Freq: Every day | ORAL | Status: DC | PRN

## 2019-05-27 MED ORDER — GABAPENTIN 100 MG PO CAPS
100.0000 mg | ORAL_CAPSULE | Freq: Three times a day (TID) | ORAL | Status: DC
  Administered 2019-05-28 – 2019-05-29 (×4): 100 mg via ORAL
  Filled 2019-05-27 (×4): qty 1

## 2019-05-27 MED ORDER — ACETAMINOPHEN 650 MG RE SUPP: 650.0000 mg | Freq: Four times a day (QID) | RECTAL | Status: DC | PRN

## 2019-05-27 NOTE — ED Provider Notes (Signed)
[facility] EMERGENCY DEPARTMENT Provider Note   CSN: [id number] Arrival date & time: 05/27/19  1104     History Chief Complaint  Patient presents with  . Chest Pain    [name] is a 73 y.o. female.  Patient complains of pleuritic chest pain.  No shortness of breath  The history is provided by the patient. No language interpreter was used.  Chest Pain Pain location:  Unable to specify Pain quality: aching   Pain radiates to:  Does not radiate Pain severity:  Moderate Onset quality:  Sudden Timing:  Intermittent Progression:  Worsening Chronicity:  New Context: breathing   Associated symptoms: no abdominal pain, no back pain, no cough, no fatigue and no headache        Past Medical History:  Diagnosis Date  . Anxiety   . Back pain   . Fibromyalgia   . Hyperlipemia   . Hypothyroidism   . Neuropathy   . Sinusitis   . Stroke [facility](HCC)    no deficits    Patient Active Problem List   Diagnosis Date Noted  . Esophageal dysphagia 08/10/2018  . Back pain 05/05/2012  . Fibromyalgia 05/05/2012  . Chest pressure 04/03/2012  . Carotid bruit 04/03/2012  . Anxiety 10/05/2011    Past Surgical History:  Procedure Laterality Date  . ABDOMINAL HYSTERECTOMY    . BIOPSY  08/25/2018   Procedure: BIOPSY;  Surgeon: Malissa Hippoehman, Najeeb U, MD;  Location: [facility];  Service: Endoscopy;;  . CATARACT EXTRACTION Bilateral   . CHOLECYSTECTOMY    . ESOPHAGEAL DILATION N/A 06/27/2015   Procedure: ESOPHAGEAL DILATION;  Surgeon: Malissa HippoNajeeb U Rehman, MD;  Location: [facility];  Service: Endoscopy;  Laterality: N/A;  . ESOPHAGEAL DILATION N/A 08/25/2018   Procedure: ESOPHAGEAL DILATION;  Surgeon: Malissa Hippoehman, Najeeb U, MD;  [facility]: [facility];  Service: Endoscopy;  Laterality: N/A;  . ESOPHAGOGASTRODUODENOSCOPY (EGD) WITH PROPOFOL N/A 06/27/2015   Procedure: ESOPHAGOGASTRODUODENOSCOPY (EGD) WITH PROPOFOL;  Surgeon: Malissa HippoNajeeb U Rehman, MD;  [facility];  Service: Endoscopy;   Laterality: N/A;  . ESOPHAGOGASTRODUODENOSCOPY (EGD) WITH PROPOFOL N/A 08/25/2018   Procedure: ESOPHAGOGASTRODUODENOSCOPY (EGD) WITH PROPOFOL;  Surgeon: Malissa Hippoehman, Najeeb U, MD;  [facility];  Service: Endoscopy;  Laterality: N/A;  9:30     OB History   No obstetric history on file.     Family History  Problem Relation Age of Onset  . Depression Mother   . Depression Father   . Depression Sister   . Anxiety disorder Sister   . Anxiety disorder Sister   . Alcohol abuse Neg Hx   . Drug abuse Neg Hx     Social History   Tobacco Use  . Smoking status: Current Every Day Smoker    Packs/day: 0.25    Years: 50.00    Pack years: 12.50    Types: Cigarettes  . Smokeless tobacco: Never Used  . Tobacco comment: smokes 1/2 pack every 2 day.  Substance Use Topics  . Alcohol use: No    Alcohol/week: 0.0 standard drinks  . Drug use: No    Home Medications Prior to Admission medications   Medication Sig Start Date End Date Taking? Authorizing Provider  amitriptyline (ELAVIL) 75 MG tablet Take 1 tablet (75 mg total) by mouth 2 (two) times daily. 07/02/19  Yes Neysa HotterHisada, Reina, MD  cyclobenzaprine (FLEXERIL) 10 MG tablet Take 10 mg by mouth 3 (three) times daily as needed for muscle spasms.  12/19/12  Yes [provider]  gabapentin (NEURONTIN) 100 MG capsule Take 100 mg by mouth 3 (three) times daily.    Yes [provider]  pantoprazole (PROTONIX) 40 MG tablet Take 1 tablet (40 mg total) by mouth daily before breakfast. 08/25/18  Yes Rehman, Joline Maxcy, MD  propranolol (INDERAL) 40 MG tablet Take 40 mg by mouth 2 (two) times daily.    Yes [provider]  simvastatin (ZOCOR) 40 MG tablet Take 40 mg by mouth at bedtime.  04/29/14  Yes [provider]  butalbital-acetaminophen-caffeine (FIORICET, ESGIC) 50-325-40 MG per tablet Take 3 tablets by mouth 3 (three) times daily as needed for headache or migraine.     [provider]  chlordiazePOXIDE  (LIBRIUM) 25 MG capsule Take 1 capsule (25 mg total) by mouth 2 (two) times daily. Patient not taking: Reported on 05/27/2019 05/01/19   Neysa Hotter, MD  furosemide (LASIX) 40 MG tablet Take 40 mg by mouth daily. 04/28/19   [provider]  meclizine (ANTIVERT) 25 MG tablet Take 25 mg by mouth 3 (three) times daily as needed for dizziness.    [provider]  metoCLOPramide (REGLAN) 5 MG tablet Take 5 mg by mouth every 8 (eight) hours as needed for nausea.     [provider]    Allergies    Codeine and Prednisone  Review of Systems   Review of Systems  Constitutional: Negative for appetite change and fatigue.  HENT: Negative for congestion, ear discharge and sinus pressure.   Eyes: Negative for discharge.  Respiratory: Negative for cough.   Cardiovascular: Positive for chest pain.  Gastrointestinal: Negative for abdominal pain and diarrhea.  Genitourinary: Negative for frequency and hematuria.  Musculoskeletal: Negative for back pain.  Skin: Negative for rash.  Neurological: Negative for seizures and headaches.  Psychiatric/Behavioral: Negative for hallucinations.    Physical Exam Updated Vital Signs BP 96/60   Pulse 79   Temp 99.9 F (37.7 C) (Oral)   Resp 18   Ht  (1.6 m)   Wt 53.1 kg   SpO2 92%   BMI 20.73 kg/m   Physical Exam Vitals and nursing note reviewed.  Constitutional:      Appearance: She is well-developed.  HENT:     Head: Normocephalic.     Nose: Nose normal.  Eyes:     General: No scleral icterus.    Conjunctiva/sclera: Conjunctivae normal.  Neck:     Thyroid: No thyromegaly.  Cardiovascular:     Rate and Rhythm: Normal rate and regular rhythm.     Heart sounds: No murmur. No friction rub. No gallop.   Pulmonary:     Breath sounds: No stridor. No wheezing or rales.  Chest:     Chest wall: No tenderness.  Abdominal:     General: There is no distension.     Tenderness: There is no abdominal tenderness. There is  no rebound.  Musculoskeletal:        General: Normal range of motion.     Cervical back: Neck supple.  Lymphadenopathy:     Cervical: No cervical adenopathy.  Skin:    Findings: No erythema or rash.  Neurological:     Mental Status: She is alert and oriented to person, place, and time.     Motor: No abnormal muscle tone.     Coordination: Coordination normal.  Psychiatric:        Behavior: Behavior normal.     ED Results / Procedures / Treatments   Labs (all labs  ordered are listed, but only abnormal results are displayed) Labs Reviewed  CBC WITH DIFFERENTIAL/PLATELET - Abnormal; Notable for the following components:      Result Value   WBC 14.0 (*)    Neutro Abs 11.0 (*)    All other components within normal limits  COMPREHENSIVE METABOLIC PANEL - Abnormal; Notable for the following components:   Potassium 3.1 (*)    Chloride 97 (*)    Glucose, Bld 125 (*)    Creatinine, Ser 1.23 (*)    Calcium 8.7 (*)    GFR calc non Af Amer 43 (*)    GFR calc Af Amer 50 (*)    All other components within normal limits  D-DIMER, QUANTITATIVE (NOT AT [facility]) - Abnormal; Notable for the following components:   D-Dimer, Quant 0.64 (*)    All other components within normal limits  POC SARS CORONAVIRUS 2 AG -  ED  TROPONIN I (HIGH SENSITIVITY)  TROPONIN I (HIGH SENSITIVITY)    EKG None  Radiology CT Angio Chest PE W and/or Wo Contrast  Result Date: 05/27/2019 CLINICAL DATA:  Shortness of breath.  Back pain. EXAM: CT ANGIOGRAPHY CHEST WITH CONTRAST TECHNIQUE: Multidetector CT imaging of the chest was performed using the standard protocol during bolus administration of intravenous contrast. Multiplanar CT image reconstructions and MIPs were obtained to evaluate the vascular anatomy. CONTRAST:  12mL OMNIPAQUE IOHEXOL 350 MG/ML SOLN COMPARISON:  May 17, 2015. FINDINGS: Cardiovascular: Small filling defect is seen in upper lobe branch of right pulmonary artery concerning for pulmonary  embolus. Normal heart size. No pericardial effusion. Atherosclerosis of thoracic aorta is noted without aneurysm or dissection. Mediastinum/Nodes: No enlarged mediastinal, hilar, or axillary lymph nodes. Thyroid gland, trachea, and esophagus demonstrate no significant findings. Lungs/Pleura: No pneumothorax or pleural effusion is noted. Mild right posterior basilar subsegmental atelectasis is noted. Upper Abdomen: No acute abnormality. Musculoskeletal: No chest wall abnormality. No acute or significant osseous findings. Review of the MIP images confirms the above findings. IMPRESSION: Small pulmonary embolus is noted in upper lobe branch of right pulmonary artery. Critical Value/emergent results were called by telephone at the time of interpretation on 05/27/2019 at 3:13 pm to providerJOSEPH Hermann Dottavio , who verbally acknowledged these results. Aortic Atherosclerosis (ICD10-I70.0). Electronically Signed   By: Marijo Conception M.D.   On: 05/27/2019 15:14   DG Chest Portable 1 View  Result Date: 05/27/2019 CLINICAL DATA:  Pt states she has been having back pain and chest pressure for several hours. Pt was recently taken off her pain medications due to pt overtaking them. EXAM: PORTABLE CHEST 1 VIEW COMPARISON:  05/07/2019. FINDINGS: Cardiac silhouette is normal in size. No mediastinal or hilar masses. No evidence of adenopathy. Clear lungs.  No pleural effusion or pneumothorax. Skeletal structures are grossly intact. IMPRESSION: No active disease. Electronically Signed   By: Lajean Manes M.D.   On: 05/27/2019 12:15    Procedures Procedures (including critical care time)  Medications Ordered in ED Medications  HYDROmorphone (DILAUDID) injection 0.5 mg (0.5 mg Intravenous Given 05/27/19 1204)  ondansetron (ZOFRAN) injection 4 mg (4 mg Intravenous Given 05/27/19 1204)  iohexol (OMNIPAQUE) 350 MG/ML injection 100 mL (100 mLs Intravenous Contrast Given 05/27/19 1442)    ED Course  I have reviewed the triage  vital signs and the nursing notes.  Pertinent labs & imaging results that were available during my care of the patient were reviewed by me and considered in my medical decision making (see chart for details).  MDM Rules/Calculators/A&P     CHA2DS2/VAS Stroke Risk Points      N/A >= 2 Points: High Risk  1 - 1.99 Points: Medium Risk  0 Points: Low Risk    A final score could not be computed because of missing components.: Last  Change: N/A     This score determines the patient's risk of having a stroke if the  patient has atrial fibrillation.      This score is not applicable to this patient. Components are not  calculated.      CRITICAL CARE Performed by: Bethann Berkshire Total critical care time: 40 minutes Critical care time was exclusive of separately billable procedures and treating other patients. Critical care was necessary to treat or prevent imminent or life-threatening deterioration. Critical care was time spent personally by me on the following activities: development of treatment plan with patient and/or surrogate as well as nursing, discussions with consultants, evaluation of patient's response to treatment, examination of patient, obtaining history from patient or surrogate, ordering and performing treatments and interventions, ordering and review of laboratory studies, ordering and review of radiographic studies, pulse oximetry and re-evaluation of patient's condition.               Patient has a pulmonary embolus.  She will be admitted to medicine Final Clinical Impression(s) / ED Diagnoses Final diagnoses:  SOB (shortness of breath)    Rx / DC Orders ED Discharge Orders    None       Bethann Berkshire, MD 05/27/19 1526

## 2019-05-27 NOTE — H&P (Addendum)
History and Physical    [name] GHW:[id number] DOB: 03-Oct-1945 DOA: 05/27/2019  PCP: Celene Squibb, MD   Patient coming from: Home  I have personally briefly reviewed patient's old medical records in [place]  Chief Complaint: Chest pain  HPI: [name] is a 73 y.o. female with medical history significant for hypothyroidism, stroke, anxiety, fibromyalgia, chronic pain.  Patient presented to the ED with complaints of chest pain described as pressure-like over the past several hours that started today.  She was lying down resting when the chest pain started.  She reports pain started in her left shoulder radiated down her arm, then to the middle of her chest radiating up to her jaw.  She denies associated difficulty breathing, but sister at bedside says she reported some to her earlier. Patient has also had poor p.o. intake over the past 3 weeks since she stopped taking her narcotics for chronic back pain and fibromyalgia.,  She reports ports feeling generally weak. Recently completed treatment for UTI 12/10, ? Name.  Sister reports patient is not active, lying on for most of the day. No recent surgeries, no recent travel, does not take hormonal supplements.  Family history of sister who was recently diagnosed with blood clots in her legs and lungs.  ED Course: T-max 99.9.  Systolic blood pressure down to 82/55.  Heart rate 70s to 80s.  O2 sat greater than 92% on room air.  D-dimer 0.64.  Hs troponin 6.  EKG sinus rhythm no change from prior.  WBC 14.  Mild creatinine elevation at 1.23.  CTA chest showed small pulmonary embolus in the upper lobe branch of the right pulmonary artery.  Hospitalist to admit for further evaluation and management.  Review of Systems: As per HPI all other systems reviewed and negative.  Past Medical History:  Diagnosis Date  . Anxiety   . Back pain   . Fibromyalgia   . Hyperlipemia   . Hypothyroidism   . Neuropathy   . Sinusitis   . Stroke  [facility])    no deficits    Past Surgical History:  Procedure Laterality Date  . ABDOMINAL HYSTERECTOMY    . BIOPSY  08/25/2018   Procedure: BIOPSY;  Surgeon: Rogene Houston, MD;  [facility];  Service: Endoscopy;;  . CATARACT EXTRACTION Bilateral   . CHOLECYSTECTOMY    . ESOPHAGEAL DILATION N/A 06/27/2015   Procedure: ESOPHAGEAL DILATION;  Surgeon: Rogene Houston, MD;  [facility];  Service: Endoscopy;  Laterality: N/A;  . ESOPHAGEAL DILATION N/A 08/25/2018   Procedure: ESOPHAGEAL DILATION;  Surgeon: Rogene Houston, MD;  [facility];  Service: Endoscopy;  Laterality: N/A;  . ESOPHAGOGASTRODUODENOSCOPY (EGD) WITH PROPOFOL N/A 06/27/2015   Procedure: ESOPHAGOGASTRODUODENOSCOPY (EGD) WITH PROPOFOL;  Surgeon: Rogene Houston, MD;  [facility];  Service: Endoscopy;  Laterality: N/A;  . ESOPHAGOGASTRODUODENOSCOPY (EGD) WITH PROPOFOL N/A 08/25/2018   Procedure: ESOPHAGOGASTRODUODENOSCOPY (EGD) WITH PROPOFOL;  Surgeon: Rogene Houston, MD;  [facility];  Service: Endoscopy;  Laterality: N/A;  9:30     reports that she has been smoking cigarettes. She has a 12.50 pack-year smoking history. She has never used smokeless tobacco. She reports that she does not drink alcohol or use drugs.  Allergies  Allergen Reactions  . Codeine Nausea Only  . Prednisone Palpitations and Hypertension    Family History  Problem Relation Age of Onset  . Depression Mother   . Depression  Father   . Depression Sister   . Anxiety disorder Sister   . Anxiety disorder Sister   . Alcohol abuse Neg Hx   . Drug abuse Neg Hx     Prior to Admission medications   Medication Sig Start Date End Date Taking? Authorizing Provider  amitriptyline (ELAVIL) 75 MG tablet Take 1 tablet (75 mg total) by mouth 2 (two) times daily. 07/02/19  Yes Neysa HotterHisada, Reina, MD  cyclobenzaprine (FLEXERIL) 10 MG tablet Take 10 mg by mouth 3 (three) times daily as needed for muscle spasms.   12/19/12  Yes [provider]  gabapentin (NEURONTIN) 100 MG capsule Take 100 mg by mouth 3 (three) times daily.    Yes [provider]  pantoprazole (PROTONIX) 40 MG tablet Take 1 tablet (40 mg total) by mouth daily before breakfast. 08/25/18  Yes Rehman, Joline MaxcyNajeeb U, MD  propranolol (INDERAL) 40 MG tablet Take 40 mg by mouth 2 (two) times daily.    Yes [provider]  simvastatin (ZOCOR) 40 MG tablet Take 40 mg by mouth at bedtime.  04/29/14  Yes [provider]  butalbital-acetaminophen-caffeine (FIORICET, ESGIC) 50-325-40 MG per tablet Take 3 tablets by mouth 3 (three) times daily as needed for headache or migraine.     [provider]  chlordiazePOXIDE (LIBRIUM) 25 MG capsule Take 1 capsule (25 mg total) by mouth 2 (two) times daily. Patient not taking: Reported on 05/27/2019 05/01/19   Neysa HotterHisada, Reina, MD  furosemide (LASIX) 40 MG tablet Take 40 mg by mouth daily. 04/28/19   [provider]  meclizine (ANTIVERT) 25 MG tablet Take 25 mg by mouth 3 (three) times daily as needed for dizziness.    [provider]  metoCLOPramide (REGLAN) 5 MG tablet Take 5 mg by mouth every 8 (eight) hours as needed for nausea.     [provider]    Physical Exam: Vitals:   05/27/19 1400 05/27/19 1430 05/27/19 1445 05/27/19 1500  BP: 99/62 90/61  96/60  Pulse: 73 70 69 79  Resp:      Temp:      TempSrc:      SpO2: 93% 93% 96% 92%  Weight:      Height:        Constitutional: NAD, calm, comfortable Vitals:   05/27/19 1400 05/27/19 1430 05/27/19 1445 05/27/19 1500  BP: 99/62 90/61  96/60  Pulse: 73 70 69 79  Resp:      Temp:      TempSrc:      SpO2: 93% 93% 96% 92%  Weight:      Height:       Eyes: PERRL, lids and conjunctivae normal ENMT: Mucous membranes are moist. Posterior pharynx clear of any exudate or lesions.Normal dentition.  Neck: normal, supple, no masses, no thyromegaly Respiratory: clear to auscultation bilaterally,  no wheezing, no crackles. Normal respiratory effort. No accessory muscle use.  Cardiovascular: Regular rate and rhythm, no murmurs / rubs / gallops. No extremity edema. 2+ pedal pulses.  Abdomen: no tenderness, no masses palpated. No hepatosplenomegaly. Bowel sounds positive.  Musculoskeletal: no clubbing / cyanosis. No joint deformity upper and lower extremities. Good ROM, no contractures. Normal muscle tone.  Skin: no rashes, lesions, ulcers. No induration Neurologic: CN 2-12 grossly intact.  Strength 5/5 in all 4.  Psychiatric: Normal judgment and insight. Alert and oriented x 3. Normal mood.   Labs on Admission: I have personally reviewed following labs and imaging studies  CBC: Recent Labs  Lab 05/27/19  1144  WBC 14.0*  NEUTROABS 11.0*  HGB 14.0  HCT 41.5  MCV 98.6  PLT 222   Basic Metabolic Panel: Recent Labs  Lab 05/27/19 1144  NA 140  K 3.1*  CL 97*  CO2 30  GLUCOSE 125*  BUN 21  CREATININE 1.23*  CALCIUM 8.7*   Liver Function Tests: Recent Labs  Lab 05/27/19 1144  AST 26  ALT 24  ALKPHOS 54  BILITOT 0.7  PROT 7.3  ALBUMIN 4.0    Radiological Exams on Admission: CT Angio Chest PE W and/or Wo Contrast  Result Date: 05/27/2019 CLINICAL DATA:  Shortness of breath.  Back pain. EXAM: CT ANGIOGRAPHY CHEST WITH CONTRAST TECHNIQUE: Multidetector CT imaging of the chest was performed using the standard protocol during bolus administration of intravenous contrast. Multiplanar CT image reconstructions and MIPs were obtained to evaluate the vascular anatomy. CONTRAST:  OMNIPAQUE IOHEXOL 350 MG/ML SOLN COMPARISON:  May 17, 2015. FINDINGS: Cardiovascular: Small filling defect is seen in upper lobe branch of right pulmonary artery concerning for pulmonary embolus. Normal heart size. No pericardial effusion. Atherosclerosis of thoracic aorta is noted without aneurysm or dissection. Mediastinum/Nodes: No enlarged mediastinal, hilar, or axillary lymph nodes. Thyroid  gland, trachea, and esophagus demonstrate no significant findings. Lungs/Pleura: No pneumothorax or pleural effusion is noted. Mild right posterior basilar subsegmental atelectasis is noted. Upper Abdomen: No acute abnormality. Musculoskeletal: No chest wall abnormality. No acute or significant osseous findings. Review of the MIP images confirms the above findings. IMPRESSION: Small pulmonary embolus is noted in upper lobe branch of right pulmonary artery. Critical Value/emergent results were called by telephone at the time of interpretation on 05/27/2019 at 3:13 pm to providerJOSEPH ZAMMIT , who verbally acknowledged these results. Aortic Atherosclerosis (ICD10-I70.0). Electronically Signed   By: Lupita Raider M.D.   On: 05/27/2019 15:14   DG Chest Portable 1 View  Result Date: 05/27/2019 CLINICAL DATA:  Pt states she has been having back pain and chest pressure for several hours. Pt was recently taken off her pain medications due to pt overtaking them. EXAM: PORTABLE CHEST 1 VIEW COMPARISON:  05/07/2019. FINDINGS: Cardiac silhouette is normal in size. No mediastinal or hilar masses. No evidence of adenopathy. Clear lungs.  No pleural effusion or pneumothorax. Skeletal structures are grossly intact. IMPRESSION: No active disease. Electronically Signed   By: Amie Portland M.D.   On: 05/27/2019 12:15    EKG: Sinus rhythm, compared to prior EKG there is no significant change.  QTc 471.  Assessment/Plan Active Problems:   PE (pulmonary thromboembolism) (HCC)    Unprovoked pulmonary embolism- chest pain, on room air- O2 sats > 92%, systolic blood pressure 82-96 CTA - Chest Small pulmonary embolus is noted in upper lobe branch of right pulmonary artery.  Chronic inactivity, no recent surgery or travel not on hormonal supplements.  Reports recent leg and arm swelling that improved with Lasix, which was discontinued 3 days ago.  Hemoglobin is stable- 14, Platelets normal 222.  No history of GI bleed.  EGD  done 08/2018 for dysphagia-esophageal webs which were dilated otherwise unremarkable. -Start heparin GTT -Echocardiogram -CBC a.m. -Case manager consult to assist with medication  Hypotension- systolic down to 46/56, now 96/60, likely 2/2 to poor PO intake, recent lasix use. Doubt Hypotension is secondary to PE considering signs of embolus. - 1 L N/s bolus, cont n/s 100cc/hr x 15 hrs - addendum- Became hypotensive again, systolic 81, requiring a repeat 1L bolus with improvement.   Mild acute kidney  injury creatinine 1.23, baseline ~0.8, likely secondary to poor p.o. intake of 3 weeks durations, and recent Lasix use. -Hydrate - BMP a.m  Leukocytosis-14.  T-max 99.9.  ? ? Secondary to small PE.  At this time no focus of infection identified.  Reports recent treatment for UTI, ? Medication.  CTA chest without infection. -CBC a.m.  Hypokalemia-3.1 -Check magnesium -Replete  History of hypothyroidism-not on medication.  History of stroke-no deficits. -Resume home statins  History of chronic pain, fibromyalgia, - home narcotics recently discontinued after ED recent ED visit, when patient came to the ED, lethargic and with slurred speech, as there was a concern patient had been taking too much narcotics with benzodiazepines and several doses of Fioricets containing barbiturates. -Resume home gabapentin, Flexeril  Anxiety -Resume home amitriptyline  Palpitations-patient reports "heart fluttering" and so she was placed on propranolol. -Hold home propranolol with hypotension.  DVT prophylaxis: Heparin per pharmacy Code Status: Full code Family Communication: Sister at bedside.  Plan of care explained.  Patient and sister voiced understanding. Disposition Plan: Per rounding team Consults called: None Admission status: Obs, tele   Onnie Boer MD [facility]  05/27/2019, 4:37 PM

## 2019-05-27 NOTE — ED Triage Notes (Signed)
Pt states she has been having back pain and chest pressure for several hours. Pt was recently taken off her pain medications due to pt overtaking them.

## 2019-05-27 NOTE — Progress Notes (Signed)
ANTICOAGULATION CONSULT NOTE - Initial Consult  Pharmacy Consult for Heparin Indication: pulmonary embolus  Allergies  Allergen Reactions  . Codeine Nausea Only  . Prednisone Palpitations and Hypertension    Patient Measurements: Height: 5\' 3"  (160 cm) Weight: 117 lb (53.1 kg) IBW/kg (Calculated) : 52.4 HEPARIN DW (KG): 53.1  Vital Signs: Temp: 99.9 F (37.7 C) (12/13 1118) Temp Source: Oral (12/13 1118) BP: 96/60 (12/13 1500) Pulse Rate: 79 (12/13 1500)  Labs: Recent Labs    05/27/19 1144 05/27/19 1318  HGB 14.0  --   HCT 41.5  --   PLT 222  --   CREATININE 1.23*  --   TROPONINIHS 6 6    Estimated Creatinine Clearance: 33.7 mL/min (A) (by C-G formula based on SCr of 1.23 mg/dL (H)).   Medical History: Past Medical History:  Diagnosis Date  . Anxiety   . Back pain   . Fibromyalgia   . Hyperlipemia   . Hypothyroidism   . Neuropathy   . Sinusitis   . Stroke [facility])    no deficits    Medications:  See med rec  Assessment: Patient presents to ED with SOB and chest pain.CT angiogram shows Small pulmonary embolus in upper lobe branch of right pulmonary artery. Patient is not on any oral anticoagulants. Pharmacy asked to start heparin  Goal of Therapy:  Heparin level 0.3-0.7 units/ml Monitor platelets by anticoagulation protocol: Yes   Plan:  Give 3000 units bolus x 1 Start heparin infusion at 1000 units/hr Check anti-Xa level in ~6-8 hours and daily while on heparin Continue to monitor H&H and platelets  Isac Sarna, BS [place], BCPS Clinical Pharmacist Pager [phone] 05/27/2019,4:07 PM

## 2019-05-27 NOTE — ED Notes (Signed)
Call from someone identifying herself as sister  Asks if Dr Roderic Palau is seeing her sister

## 2019-05-28 ENCOUNTER — Observation Stay (HOSPITAL_COMMUNITY): Payer: PPO

## 2019-05-28 DIAGNOSIS — N179 Acute kidney failure, unspecified: Secondary | ICD-10-CM | POA: Diagnosis present

## 2019-05-28 DIAGNOSIS — F17210 Nicotine dependence, cigarettes, uncomplicated: Secondary | ICD-10-CM | POA: Diagnosis present

## 2019-05-28 DIAGNOSIS — R002 Palpitations: Secondary | ICD-10-CM | POA: Diagnosis present

## 2019-05-28 DIAGNOSIS — R0602 Shortness of breath: Secondary | ICD-10-CM | POA: Diagnosis present

## 2019-05-28 DIAGNOSIS — G629 Polyneuropathy, unspecified: Secondary | ICD-10-CM | POA: Diagnosis present

## 2019-05-28 DIAGNOSIS — Z9842 Cataract extraction status, left eye: Secondary | ICD-10-CM | POA: Diagnosis not present

## 2019-05-28 DIAGNOSIS — E876 Hypokalemia: Secondary | ICD-10-CM | POA: Diagnosis present

## 2019-05-28 DIAGNOSIS — Z79899 Other long term (current) drug therapy: Secondary | ICD-10-CM | POA: Diagnosis not present

## 2019-05-28 DIAGNOSIS — Z9841 Cataract extraction status, right eye: Secondary | ICD-10-CM | POA: Diagnosis not present

## 2019-05-28 DIAGNOSIS — Z79891 Long term (current) use of opiate analgesic: Secondary | ICD-10-CM | POA: Diagnosis not present

## 2019-05-28 DIAGNOSIS — Z9049 Acquired absence of other specified parts of digestive tract: Secondary | ICD-10-CM | POA: Diagnosis not present

## 2019-05-28 DIAGNOSIS — Z20828 Contact with and (suspected) exposure to other viral communicable diseases: Secondary | ICD-10-CM | POA: Diagnosis present

## 2019-05-28 DIAGNOSIS — E785 Hyperlipidemia, unspecified: Secondary | ICD-10-CM | POA: Diagnosis present

## 2019-05-28 DIAGNOSIS — Z23 Encounter for immunization: Secondary | ICD-10-CM | POA: Diagnosis present

## 2019-05-28 DIAGNOSIS — Z8673 Personal history of transient ischemic attack (TIA), and cerebral infarction without residual deficits: Secondary | ICD-10-CM | POA: Diagnosis not present

## 2019-05-28 DIAGNOSIS — I959 Hypotension, unspecified: Secondary | ICD-10-CM | POA: Diagnosis present

## 2019-05-28 DIAGNOSIS — D72829 Elevated white blood cell count, unspecified: Secondary | ICD-10-CM | POA: Diagnosis present

## 2019-05-28 DIAGNOSIS — E039 Hypothyroidism, unspecified: Secondary | ICD-10-CM | POA: Diagnosis present

## 2019-05-28 DIAGNOSIS — Z885 Allergy status to narcotic agent status: Secondary | ICD-10-CM | POA: Diagnosis not present

## 2019-05-28 DIAGNOSIS — M797 Fibromyalgia: Secondary | ICD-10-CM | POA: Diagnosis present

## 2019-05-28 DIAGNOSIS — Z8249 Family history of ischemic heart disease and other diseases of the circulatory system: Secondary | ICD-10-CM | POA: Diagnosis not present

## 2019-05-28 DIAGNOSIS — F419 Anxiety disorder, unspecified: Secondary | ICD-10-CM | POA: Diagnosis present

## 2019-05-28 DIAGNOSIS — I2699 Other pulmonary embolism without acute cor pulmonale: Secondary | ICD-10-CM | POA: Diagnosis present

## 2019-05-28 DIAGNOSIS — G8929 Other chronic pain: Secondary | ICD-10-CM | POA: Diagnosis present

## 2019-05-28 DIAGNOSIS — Z90710 Acquired absence of both cervix and uterus: Secondary | ICD-10-CM | POA: Diagnosis not present

## 2019-05-28 LAB — HEPARIN LEVEL (UNFRACTIONATED)
Heparin Unfractionated: 1.04 IU/mL — ABNORMAL HIGH (ref 0.30–0.70)
Heparin Unfractionated: 0.83 IU/mL — ABNORMAL HIGH (ref 0.30–0.70)

## 2019-05-28 LAB — BASIC METABOLIC PANEL
Anion gap: 4 — ABNORMAL LOW (ref 5–15)
BUN: 15 mg/dL (ref 8–23)
CO2: 27 mmol/L (ref 22–32)
Calcium: 7.3 mg/dL — ABNORMAL LOW (ref 8.9–10.3)
Chloride: 112 mmol/L — ABNORMAL HIGH (ref 98–111)
Creatinine, Ser: 0.92 mg/dL (ref 0.44–1.00)
GFR calc Af Amer: >60 mL/min (ref >60)
GFR calc non Af Amer: >60 mL/min (ref >60)
Glucose, Bld: 103 mg/dL — ABNORMAL HIGH (ref 70–99)
Potassium: 3.4 mmol/L — ABNORMAL LOW (ref 3.5–5.1)
Sodium: 143 mmol/L (ref 135–145)

## 2019-05-28 LAB — CBC
HCT: 33.4 % — ABNORMAL LOW (ref 36.0–46.0)
Hemoglobin: 11 g/dL — ABNORMAL LOW (ref 12.0–15.0)
MCH: 33.8 pg (ref 26.0–34.0)
MCHC: 32.9 g/dL (ref 30.0–36.0)
MCV: 102.8 fL — ABNORMAL HIGH (ref 80.0–100.0)
Platelets: 164 10*3/uL (ref 150–400)
RBC: 3.25 MIL/uL — ABNORMAL LOW (ref 3.87–5.11)
RDW: 12.3 % (ref 11.5–15.5)
WBC: 7.8 10*3/uL (ref 4.0–10.5)
nRBC: 0 % (ref 0.0–0.2)

## 2019-05-28 LAB — ECHOCARDIOGRAM COMPLETE
Height: 63 in
Weight: 2035.29 oz

## 2019-05-28 LAB — SARS CORONAVIRUS 2 (TAT 6-24 HRS): SARS Coronavirus 2: NEGATIVE

## 2019-05-28 MED ORDER — DIPHENHYDRAMINE HCL 50 MG/ML IJ SOLN
25.0000 mg | Freq: Once | INTRAMUSCULAR | Status: AC
  Administered 2019-05-28: 25 mg via INTRAVENOUS
  Filled 2019-05-28: qty 1

## 2019-05-28 MED ORDER — POTASSIUM CHLORIDE IN NACL 40-0.9 MEQ/L-% IV SOLN
INTRAVENOUS | Status: AC
  Administered 2019-05-28: 75 mL/h via INTRAVENOUS

## 2019-05-28 MED ORDER — APIXABAN 5 MG PO TABS: 5.0000 mg | ORAL_TABLET | Freq: Two times a day (BID) | ORAL | Status: DC

## 2019-05-28 MED ORDER — SODIUM CHLORIDE 0.9 % IV BOLUS
500.0000 mL | Freq: Once | INTRAVENOUS | Status: AC
  Administered 2019-05-28: 500 mL via INTRAVENOUS

## 2019-05-28 MED ORDER — PROCHLORPERAZINE EDISYLATE 10 MG/2ML IJ SOLN
10.0000 mg | Freq: Once | INTRAMUSCULAR | Status: AC
  Administered 2019-05-28: 16:00:00 10 mg via INTRAVENOUS
  Filled 2019-05-28: qty 2

## 2019-05-28 MED ORDER — APIXABAN 5 MG PO TABS
10.0000 mg | ORAL_TABLET | Freq: Two times a day (BID) | ORAL | Status: DC
  Administered 2019-05-28 – 2019-05-29 (×3): 10 mg via ORAL
  Filled 2019-05-28 (×3): qty 2

## 2019-05-28 NOTE — Care Management Obs Status (Signed)
[place] NOTIFICATION   Patient Details  Name: [name] MRN: [id number] Date of Birth: Sep 15, 1945   Medicare Observation Status Notification Given:  Yes    Tommy Medal 05/28/2019, 3:57 PM

## 2019-05-28 NOTE — TOC Initial Note (Signed)
Transition of Care [facility]) - Initial/Assessment Note    Patient Details  Name: [name] MRN: [id number] Date of Birth: 03-24-1946  [facility] of [facility] CM/SW Contact:    Channon Brougher, Chauncey Reading, RN Phone Number: 05/28/2019, 2:31 PM  Clinical Narrative:         Consult received for medication assistance, anticoagulation. Will provide coupon when medication therapy decided.               Expected Discharge Plan and Services     Discharge Planning Services: CM Consult, Medication Assistance         Activities of Daily Living Home Assistive Devices/Equipment: Cane (specify quad or straight), Walker (specify type) ADL Screening (condition at time of admission) Patient's cognitive ability adequate to safely complete daily activities?: No Is the patient deaf or have difficulty hearing?: Yes Does the patient have difficulty seeing, even when wearing glasses/contacts?: No Does the patient have difficulty concentrating, remembering, or making decisions?: Yes Patient able to express need for assistance with ADLs?: Yes Does the patient have difficulty dressing or bathing?: Yes Independently performs ADLs?: Yes (appropriate for developmental age) Does the patient have difficulty walking or climbing stairs?: No Weakness of Legs: None Weakness of Arms/Hands: None     Admission diagnosis:  SOB (shortness of breath) [R06.02] PE (pulmonary thromboembolism) ([place]) [I26.99] Patient Active Problem List   Diagnosis Date Noted  . PE (pulmonary thromboembolism) ([place]) 05/27/2019  . Esophageal dysphagia 08/10/2018  . Back pain 05/05/2012  . Fibromyalgia 05/05/2012  . Chest pressure 04/03/2012  . Carotid bruit 04/03/2012  . Anxiety 10/05/2011   PCP:  Celene Squibb, MD Pharmacy:   [facility] - [place], [place] - [address] [place] [address] Phone: [phone] Fax: [phone]     Social Determinants of Health  (SDOH) Interventions    Readmission Risk Interventions No flowsheet data found.

## 2019-05-28 NOTE — Progress Notes (Signed)
*  PRELIMINARY RESULTS* Echocardiogram 2D Echocardiogram has been performed.  [name] 05/28/2019, 1:59 PM

## 2019-05-28 NOTE — Progress Notes (Signed)
PROGRESS NOTE    [name]  WUJ:[id number] DOB: 1945/12/26 DOA: 05/27/2019 PCP: Benita StabileHall, John Z, MD    Assessment & Plan:   Active Problems:   PE (pulmonary thromboembolism) (H[facility])   [name] is a 73 y.o. Caucasian female with medical history significant for hypothyroidism, stroke, anxiety, fibromyalgia, chronic pain.  Patient presented to the ED with complaints of chest pain described as pressure-like over the past several hours prior to presentation.   Unprovoked pulmonary embolism- --presented with chest pain, on room air- O2 sats > 92%, systolic blood pressure 82-96 CTA - Chest Small pulmonary embolus is noted in upper lobe branch of right pulmonary artery.  Chronic inactivity, no recent surgery or travel not on hormonal supplements.  Reports recent leg and arm swelling that improved with Lasix, which was discontinued 3 days ago.  Hemoglobin is stable- 14, Platelets normal 222.  No history of GI bleed.  EGD done 08/2018 for dysphagia-esophageal webs which were dilated otherwise unremarkable. --TTE on 12/14 showed LVEF 70-75% with no signs of right heart strain. -continue heparin GTT  Hypotension, resolved On presentation, systolic down to 95/6282/55, improved to 96/60 with IVF, likely 2/2 to poor PO intake, recent lasix use.   Mild acute kidney injury, resolved On presentation, creatinine 1.23, baseline ~0.8, likely secondary to poor p.o. intake of 3 weeks durations, and recent Lasix use.  After IVF hydration, Cr improved to 0.92 today.  Leukocytosis, resolved WBC 14.  T-max 99.9 on presentaiton, maybe due to PE.  At this time no focus of infection identified.  CTA chest without infection.  WBC normalized and down to 7.8 today.  Hypokalemia Replete PRN  History of hypothyroidism -not on medication.  Followup oupatient.  History of stroke-no deficits. -Resume home statins  History of chronic pain, fibromyalgia, - home narcotics recently discontinued after ED recent ED  visit, when patient came to the ED, lethargic and with slurred speech, as there was a concern patient had been taking too much narcotics with benzodiazepines and several doses of Fioricets containing barbiturates. -Resume home gabapentin, Flexeril  Anxiety -Resume home amitriptyline  Palpitations-patient reports "heart fluttering" and so she was placed on propranolol. -Hold home propranolol with hypotension.   DVT prophylaxis: ZH:YQMVHQIOn:heparin gtt Code Status: Full code  Family Communication: not today Disposition Plan: home tomorrow   Subjective and Interval History:  Chest pain improved.  Pt reported breathing fine.  No fever, abdominal pain, N/V/D, dysuria, increased swelling.   Objective: Vitals:   05/28/19 1545 05/28/19 2020 05/28/19 2108 05/29/19 0513  BP: (!) 129/57  92/63 (!) 105/58  Pulse: 77  89 73  Resp: 16  16 18   Temp:   98 F (36.7 C) 98.2 F (36.8 C)  TempSrc:   Oral Oral  SpO2: 97% 97% 95% 98%  Weight:      Height:       No intake or output data in the 24 hours ending 06/01/19 1714 Filed Weights   05/27/19 1117 05/28/19 0500  Weight: 53.1 kg 57.7 kg    Examination:   Constitutional: NAD, AAOx3, a bit slow in processing  HEENT: conjunctivae and lids normal, EOMI CV: RRR no M,R,G. Distal pulses +2.  No cyanosis.   RESP: CTA B/L, normal respiratory effort, on room air  GI: +BS, NTND Extremities: No effusions, edema, or tenderness in BLE SKIN: warm, dry and intact Neuro: II - XII grossly intact.  Sensation intact Psych: Normal mood and affect.     Data Reviewed: I have  personally reviewed following labs and imaging studies  CBC: Recent Labs  Lab 05/27/19 1144 05/28/19 0119 05/29/19 0819  WBC 14.0* 7.8 5.3  NEUTROABS 11.0*  --   --   HGB 14.0 11.0* 11.0*  HCT 41.5 33.4* 33.8*  MCV 98.6 102.8* 102.7*  PLT 222 164 810   Basic Metabolic Panel: Recent Labs  Lab 05/27/19 1144 05/27/19 1318 05/28/19 0119 05/29/19 0819  NA 140  --  143 143    K 3.1*  --  3.4* 3.8  CL 97*  --  112* 113*  CO2 30  --  27 25  GLUCOSE 125*  --  103* 132*  BUN 21  --  15 7*  CREATININE 1.23*  --  0.92 0.79  CALCIUM 8.7*  --  7.3* 8.1*  MG  --  1.7  --  1.6*   GFR: Estimated Creatinine Clearance: 51.8 mL/min (by C-G formula based on SCr of 0.79 mg/dL). Liver Function Tests: Recent Labs  Lab 05/27/19 1144  AST 26  ALT 24  ALKPHOS 54  BILITOT 0.7  PROT 7.3  ALBUMIN 4.0   No results for input(s): LIPASE, AMYLASE in the last 168 hours. No results for input(s): AMMONIA in the last 168 hours. Coagulation Profile: No results for input(s): INR, PROTIME in the last 168 hours. Cardiac Enzymes: No results for input(s): CKTOTAL, CKMB, CKMBINDEX, TROPONINI in the last 168 hours. BNP (last 3 results) No results for input(s): PROBNP in the last 8760 hours. HbA1C: No results for input(s): HGBA1C in the last 72 hours. CBG: No results for input(s): GLUCAP in the last 168 hours. Lipid Profile: No results for input(s): CHOL, HDL, LDLCALC, TRIG, CHOLHDL, LDLDIRECT in the last 72 hours. Thyroid Function Tests: No results for input(s): TSH, T4TOTAL, FREET4, T3FREE, THYROIDAB in the last 72 hours. Anemia Panel: No results for input(s): VITAMINB12, FOLATE, FERRITIN, TIBC, IRON, RETICCTPCT in the last 72 hours. Sepsis Labs: No results for input(s): PROCALCITON, LATICACIDVEN in the last 168 hours.  Recent Results (from the past 240 hour(s))  SARS CORONAVIRUS 2 (TAT 6-24 HRS) Nasopharyngeal Nasopharyngeal Swab     Status: None   Collection Time: 05/27/19 11:30 PM   Specimen: Nasopharyngeal Swab  Result Value Ref Range Status   SARS Coronavirus 2 NEGATIVE NEGATIVE Final    Comment: (NOTE) SARS-CoV-2 target nucleic acids are NOT DETECTED. The SARS-CoV-2 RNA is generally detectable in upper and lower respiratory specimens during the acute phase of infection. Negative results do not preclude SARS-CoV-2 infection, do not rule out co-infections with other  pathogens, and should not be used as the sole basis for treatment or other patient management decisions. Negative results must be combined with clinical observations, patient history, and epidemiological information. The expected result is Negative. Fact Sheet for Patients: SugarRoll.be Fact Sheet for Healthcare Providers: https://www.woods-mathews.com/ This test is not yet approved or cleared by the [place] FDA and  has been authorized for detection and/or diagnosis of SARS-CoV-2 by FDA under an Emergency Use Authorization (EUA). This EUA will remain  in effect (meaning this test can be used) for the duration of the COVID-19 declaration under Section 56 4(b)(1) of the Act, 21 U.S.C. section 360bbb-3(b)(1), unless the authorization is terminated or revoked sooner. Performed at [facility], [place] [address] [place], [place] [address]       Radiology Studies: No results found.   Scheduled Meds: Continuous Infusions:   LOS: 1 day     Enzo Bi, MD [facility] If 7PM-7AM, please  contact night-coverage 06/01/2019, 5:14 PM

## 2019-05-28 NOTE — Progress Notes (Signed)
ANTICOAGULATION CONSULT NOTE - Pharmacy Consult for Heparin Indication: pulmonary embolus  Allergies  Allergen Reactions  . Codeine Nausea Only  . Prednisone Palpitations and Hypertension    Patient Measurements: Height: 5\' 3"  (160 cm) Weight: 127 lb 3.3 oz (57.7 kg) IBW/kg (Calculated) : 52.4 HEPARIN DW (KG): 57.7  Vital Signs: Temp: 98.6 F (37 C) (12/14 0500) Temp Source: Oral (12/14 0500) BP: 100/55 (12/14 0500) Pulse Rate: 85 (12/14 0500)  Labs: Recent Labs    05/27/19 1144 05/27/19 1318 05/28/19 0119 05/28/19 1007  HGB 14.0  --  11.0*  --   HCT 41.5  --  33.4*  --   PLT 222  --  164  --   HEPARINUNFRC  --   --  1.04* 0.83*  CREATININE 1.23*  --  0.92  --   TROPONINIHS 6 6  --   --     Estimated Creatinine Clearance: 45.1 mL/min (by C-G formula based on SCr of 0.92 mg/dL).   Medical History: Past Medical History:  Diagnosis Date  . Anxiety   . Back pain   . Fibromyalgia   . Hyperlipemia   . Hypothyroidism   . Neuropathy   . Sinusitis   . Stroke [facility])    no deficits    Medications:  See med rec  Assessment: Patient presents to ED with SOB and chest pain.CT angiogram shows Small pulmonary embolus in upper lobe branch of right pulmonary artery. Patient is not on any oral anticoagulants. Pharmacy asked to start heparin HL remain supratherapeutic at 0.83  Goal of Therapy:  Heparin level 0.3-0.7 units/ml Monitor platelets by anticoagulation protocol: Yes   Plan:  Decrease heparin infusion to 700 units/hr Check anti-Xa level in ~6-8 hours and daily while on heparin Continue to monitor H&H and platelets  Isac Sarna, BS [place], BCPS Clinical Pharmacist Pager [phone] 05/28/2019,11:42 AM

## 2019-05-29 DIAGNOSIS — E782 Mixed hyperlipidemia: Secondary | ICD-10-CM | POA: Diagnosis not present

## 2019-05-29 DIAGNOSIS — E785 Hyperlipidemia, unspecified: Secondary | ICD-10-CM | POA: Diagnosis not present

## 2019-05-29 DIAGNOSIS — E039 Hypothyroidism, unspecified: Secondary | ICD-10-CM | POA: Diagnosis not present

## 2019-05-29 DIAGNOSIS — G43019 Migraine without aura, intractable, without status migrainosus: Secondary | ICD-10-CM | POA: Diagnosis not present

## 2019-05-29 DIAGNOSIS — R000 Tachycardia, unspecified: Secondary | ICD-10-CM | POA: Diagnosis not present

## 2019-05-29 DIAGNOSIS — F331 Major depressive disorder, recurrent, moderate: Secondary | ICD-10-CM | POA: Diagnosis not present

## 2019-05-29 DIAGNOSIS — R7301 Impaired fasting glucose: Secondary | ICD-10-CM | POA: Diagnosis not present

## 2019-05-29 LAB — BASIC METABOLIC PANEL
Anion gap: 5 (ref 5–15)
BUN: 7 mg/dL — ABNORMAL LOW (ref 8–23)
CO2: 25 mmol/L (ref 22–32)
Calcium: 8.1 mg/dL — ABNORMAL LOW (ref 8.9–10.3)
Chloride: 113 mmol/L — ABNORMAL HIGH (ref 98–111)
Creatinine, Ser: 0.79 mg/dL (ref 0.44–1.00)
GFR calc Af Amer: >60 mL/min (ref >60)
GFR calc non Af Amer: >60 mL/min (ref >60)
Glucose, Bld: 132 mg/dL — ABNORMAL HIGH (ref 70–99)
Potassium: 3.8 mmol/L (ref 3.5–5.1)
Sodium: 143 mmol/L (ref 135–145)

## 2019-05-29 LAB — CBC
HCT: 33.8 % — ABNORMAL LOW (ref 36.0–46.0)
Hemoglobin: 11 g/dL — ABNORMAL LOW (ref 12.0–15.0)
MCH: 33.4 pg (ref 26.0–34.0)
MCHC: 32.5 g/dL (ref 30.0–36.0)
MCV: 102.7 fL — ABNORMAL HIGH (ref 80.0–100.0)
Platelets: 184 10*3/uL (ref 150–400)
RBC: 3.29 MIL/uL — ABNORMAL LOW (ref 3.87–5.11)
RDW: 12.5 % (ref 11.5–15.5)
WBC: 5.3 10*3/uL (ref 4.0–10.5)
nRBC: 0 % (ref 0.0–0.2)

## 2019-05-29 LAB — MAGNESIUM: Magnesium: 1.6 mg/dL — ABNORMAL LOW (ref 1.7–2.4)

## 2019-05-29 MED ORDER — ELIQUIS DVT/PE STARTER PACK 5 MG PO TBPK: ORAL_TABLET | ORAL | 0 refills | Status: DC

## 2019-05-29 MED ORDER — FUROSEMIDE 40 MG PO TABS: ORAL_TABLET | ORAL | Status: DC

## 2019-05-29 NOTE — Discharge Summary (Signed)
Physician Discharge Summary   [name]  female DOB: [birth year]  MVH:[id number]  PCP: Benita Stabile, MD  Admit date: 05/27/2019 Discharge date: 05/29/2019  Admitted From: home Disposition:  home Around 1 am on 05/29/19, pt called and voiced concern about pt "falling at home and needs help with hygiene and ADLS."  PT ordered who evaluated pt prior to discharge and identified no needs.  CODE STATUS: Full code  Discharge Instructions    Diet - low sodium heart healthy   Complete by: As directed    Discharge instructions   Complete by: As directed    For the blood clots in your lungs, you will be taking blood thinner, Eliquis, for the next 6 months.  I have send the prescription to [facility] for the first month supply.  Please take them according to the prescription instructions.  You will need to pick it up and start taking tonight.  Please see your PCP about 1 week after discharge, so your PCP and continue your prescription for Eliquis.  Dr. Darlin Priestly   Increase activity slowly   Complete by: As directed       Hospital Course:  For full details, please see H&P, progress notes, consult notes and ancillary notes.  Briefly,  [name] a 73 y.o.Caucasian femalewith medical history significant forhypothyroidism, stroke, anxiety, fibromyalgia, chronic pain. Patient presented to the ED with complaints of chest pain described as pressure-like over the past several hours prior to presentation.   Unprovoked pulmonary embolism Presented with chest pain, on room air- O2 sats > 92%,systolic blood pressure 82-96,CTA - ChestSmall pulmonary embolus is noted in upper lobe branch of right pulmonary artery.Pt has chronic inactivity, no recent surgery or travel, not onhormonal supplements.[place] Venous showed no DVT in BLE.  TTE on 12/14 showed LVEF 70-75% with no signs of right heart strain.  Pt was started on heparin gtt on admission, and transitioned to Eliquis  prior to discharge.  Pt received education from pharmacy and 49-month-free coupon for Eliquis.  Eliquis starter pack was send to a local pharmacy so pt can pick it on and start taking it the night of discharge.  I have asked pt to follow up with PCP to ensure refills and continuation of anticoagulation for 6 months.  Pt's sister updated on the plan on the phone prior to discharge.  Hypotension, resolved On presentation, systolic down to 84/13, improved to 96/60 with IVF, likely 2/2 to poor PO intake, recent lasix use. Home Lasix was held at discharge until PCP followup due to pt's soft BP.  Mild acute kidney injury, resolved On presentation, creatinine 1.23, baseline~0.8,likely secondary to poor p.o. intake of 3 weeks durations,and recent Lasix use.  Cr improved after IVF hydration.  Cr 0.79 back to baseline on the day of discharge.  Leukocytosis, resolved WBC 14. T-max 99.9 on presentaiton, maybe due to PE.  At this time no focus of infection identified. CTA chest without infection.  WBC normalized and down to 7.8 on Day 2 of hospitalization.  Hypokalemia Replete PRN  History of hypothyroidism -not on medication.  Followup oupatient.  History of stroke-no deficits. -Resume home statins  History of chronic pain,fibromyalgia, Home narcotics recently discontinued after recent ED visit,when patient presented with lethargyand slurred speech,as there was a concern patient hadbeen taking too much narcotics with benzodiazepines and several doses of Fioricets containing barbiturates. Continued on home gabapentin, Flexeril.  Anxiety Continued home amitriptyline  Palpitations patient reported"heart fluttering"and so she was placed  on propranolol.  Home propranolol held while inpatient 2/2 hypotension and resumed at discharge.   Discharge Diagnoses:  Active Problems:   PE (pulmonary thromboembolism) (HCC)    Discharge Instructions:  Allergies as of 05/29/2019       Reactions   Codeine Nausea Only   Prednisone Palpitations, Hypertension      Medication List    STOP taking these medications   butalbital-acetaminophen-caffeine 50-325-40 MG tablet Commonly known as: FIORICET   chlordiazePOXIDE 25 MG capsule Commonly known as: LIBRIUM     TAKE these medications   amitriptyline 75 MG tablet Commonly known as: ELAVIL Take 1 tablet (75 mg total) by mouth 2 (two) times daily. Start taking on: July 02, 2019   cyclobenzaprine 10 MG tablet Commonly known as: FLEXERIL Take 10 mg by mouth 3 (three) times daily as needed for muscle spasms.   Eliquis DVT/PE Starter Pack 5 MG Tbpk Generic drug: Apixaban Starter Pack Take as directed on package: start with two-5mg  tablets twice daily for 7 days. On day 8, switch to one-5mg  tablet twice daily.   furosemide 40 MG tablet Commonly known as: LASIX Hold until outpatient followup because your blood pressure was low normal. What changed:   how much to take  how to take this  when to take this  additional instructions   gabapentin 100 MG capsule Commonly known as: NEURONTIN Take 100 mg by mouth 3 (three) times daily.   meclizine 25 MG tablet Commonly known as: ANTIVERT Take 25 mg by mouth 3 (three) times daily as needed for dizziness.   metoCLOPramide 5 MG tablet Commonly known as: REGLAN Take 5 mg by mouth every 8 (eight) hours as needed for nausea.   pantoprazole 40 MG tablet Commonly known as: PROTONIX Take 1 tablet (40 mg total) by mouth daily before breakfast.   propranolol 40 MG tablet Commonly known as: INDERAL Take 40 mg by mouth 2 (two) times daily.   simvastatin 40 MG tablet Commonly known as: ZOCOR Take 40 mg by mouth at bedtime.         Allergies  Allergen Reactions  . Codeine Nausea Only  . Prednisone Palpitations and Hypertension     The results of significant diagnostics from this hospitalization (including imaging, microbiology, ancillary and laboratory) are  listed below for reference.   Consultations:   Procedures/Studies: DG Chest 1 View  Result Date: 05/07/2019 CLINICAL DATA:  Weakness slurred speech EXAM: CHEST  1 VIEW COMPARISON:  08/30/2018 FINDINGS: Heart size upper normal. Negative for heart failure. Atherosclerotic aorta. Lungs clear without infiltrate or effusion. IMPRESSION: No active disease. Electronically Signed   By: Marlan Palauharles  Clark M.D.   On: 05/07/2019 18:33   CT Head Wo Contrast  Result Date: 05/07/2019 CLINICAL DATA:  Altered level of consciousness. Slurred speech 5 days. EXAM: CT HEAD WITHOUT CONTRAST TECHNIQUE: Contiguous axial images were obtained from the base of the skull through the vertex without intravenous contrast. COMPARISON:  CT head 01/23/2019 FINDINGS: Brain: Ventricle size normal. Hypodensity in the frontal white matter bilaterally is stable since the prior study. No acute infarct, hemorrhage, mass. Vascular: Negative for hyperdense vessel Skull: Negative Sinuses/Orbits: Negative Other: None IMPRESSION: No acute intracranial abnormality. Mild chronic microvascular ischemic changes in the white matter Electronically Signed   By: Marlan Palauharles  Clark M.D.   On: 05/07/2019 18:20   CT Angio Chest PE W and/or Wo Contrast  Result Date: 05/27/2019 CLINICAL DATA:  Shortness of breath.  Back pain. EXAM: CT ANGIOGRAPHY CHEST WITH CONTRAST TECHNIQUE: Multidetector  CT imaging of the chest was performed using the standard protocol during bolus administration of intravenous contrast. Multiplanar CT image reconstructions and MIPs were obtained to evaluate the vascular anatomy. CONTRAST:  OMNIPAQUE IOHEXOL 350 MG/ML SOLN COMPARISON:  May 17, 2015. FINDINGS: Cardiovascular: Small filling defect is seen in upper lobe branch of right pulmonary artery concerning for pulmonary embolus. Normal heart size. No pericardial effusion. Atherosclerosis of thoracic aorta is noted without aneurysm or dissection. Mediastinum/Nodes: No enlarged  mediastinal, hilar, or axillary lymph nodes. Thyroid gland, trachea, and esophagus demonstrate no significant findings. Lungs/Pleura: No pneumothorax or pleural effusion is noted. Mild right posterior basilar subsegmental atelectasis is noted. Upper Abdomen: No acute abnormality. Musculoskeletal: No chest wall abnormality. No acute or significant osseous findings. Review of the MIP images confirms the above findings. IMPRESSION: Small pulmonary embolus is noted in upper lobe branch of right pulmonary artery. Critical Value/emergent results were called by telephone at the time of interpretation on 05/27/2019 at 3:13 pm to providerJOSEPH ZAMMIT , who verbally acknowledged these results. Aortic Atherosclerosis (ICD10-I70.0). Electronically Signed   By: Lupita Raider M.D.   On: 05/27/2019 15:14   US Venous Img Lower Bilateral (DVT)  Result Date: 05/28/2019 CLINICAL DATA:  Pulmonary embolism. History of previous DVT. Evaluate acute or chronic DVT. EXAM: BILATERAL LOWER EXTREMITY VENOUS DOPPLER ULTRASOUND TECHNIQUE: Gray-scale sonography with graded compression, as well as color Doppler and duplex ultrasound were performed to evaluate the lower extremity deep venous systems from the level of the common femoral vein and including the common femoral, femoral, profunda femoral, popliteal and calf veins including the posterior tibial, peroneal and gastrocnemius veins when visible. The superficial great saphenous vein was also interrogated. Spectral Doppler was utilized to evaluate flow at rest and with distal augmentation maneuvers in the common femoral, femoral and popliteal veins. COMPARISON:  None. FINDINGS: RIGHT LOWER EXTREMITY Common Femoral Vein: No evidence of thrombus. Normal compressibility, respiratory phasicity and response to augmentation. Saphenofemoral Junction: No evidence of thrombus. Normal compressibility and flow on color Doppler imaging. Profunda Femoral Vein: No evidence of thrombus. Normal  compressibility and flow on color Doppler imaging. Femoral Vein: No evidence of thrombus. Normal compressibility, respiratory phasicity and response to augmentation. Popliteal Vein: No evidence of thrombus. Normal compressibility, respiratory phasicity and response to augmentation. Calf Veins: No evidence of thrombus. Normal compressibility and flow on color Doppler imaging. Superficial Great Saphenous Vein: No evidence of thrombus. Normal compressibility. Venous Reflux:  None. Other Findings:  None. LEFT LOWER EXTREMITY Common Femoral Vein: No evidence of thrombus. Normal compressibility, respiratory phasicity and response to augmentation. Saphenofemoral Junction: No evidence of thrombus. Normal compressibility and flow on color Doppler imaging. Profunda Femoral Vein: No evidence of thrombus. Normal compressibility and flow on color Doppler imaging. Femoral Vein: No evidence of thrombus. Normal compressibility, respiratory phasicity and response to augmentation. Popliteal Vein: No evidence of thrombus. Normal compressibility, respiratory phasicity and response to augmentation. Calf Veins: No evidence of thrombus. Normal compressibility and flow on color Doppler imaging. Superficial Great Saphenous Vein: No evidence of thrombus. Normal compressibility. Venous Reflux:  None. Other Findings:  None. IMPRESSION: No evidence of acute or chronic DVT within either lower extremity. Electronically Signed   By: Simonne Come M.D.   On: 05/28/2019 15:32   DG Chest Portable 1 View  Result Date: 05/27/2019 CLINICAL DATA:  Pt states she has been having back pain and chest pressure for several hours. Pt was recently taken off her pain medications due to pt overtaking them. EXAM:  PORTABLE CHEST 1 VIEW COMPARISON:  05/07/2019. FINDINGS: Cardiac silhouette is normal in size. No mediastinal or hilar masses. No evidence of adenopathy. Clear lungs.  No pleural effusion or pneumothorax. Skeletal structures are grossly intact.  IMPRESSION: No active disease. Electronically Signed   By: Lajean Manes M.D.   On: 05/27/2019 12:15   ECHOCARDIOGRAM COMPLETE  Result Date: 05/28/2019   ECHOCARDIOGRAM REPORT   Patient Name:   [name] Date of Exam: 05/28/2019 Medical Rec #:  [id number]       Height:       63.0 in Accession #:    [id number]      Weight:       127.2 lb Date of Birth:  04-06-1946       BSA:          1.60 m Patient Age:    32 years        BP:           100/57 mmHg Patient Gender: F               HR:           85 bpm. Exam Location:  [facility] Procedure: 2D Echo, Cardiac Doppler and Color Doppler Indications:    Pulmonary Embolus 415.19 / I26.99  History:        Patient has no prior history of Echocardiogram examinations.                 Risk Factors:Dyslipidemia. Fibromyalgia,Anxiety, silicone breast                 implants x 40 years, H/O stroke.  Sonographer:    Alvino Chapel RCS Referring Phys: [phone] Leanne Chang [facility]  Sonographer Comments: Patient extrememly sore and tinder around chest area. per Patient she fell 5-6 times at home recently also. IMPRESSIONS  1. Left ventricular ejection fraction, by visual estimation, is 70 to 75%. The left ventricle has hyperdynamic function. There is no left ventricular hypertrophy.  2. The left ventricle has no regional wall motion abnormalities.  3. Global right ventricle has normal systolic function.The right ventricular size is normal. Mildly increased right ventricular wall thickness.  4. Left atrial size was normal.  5. Right atrial size was normal.  6. The mitral valve is grossly normal. Trivial mitral valve regurgitation.  7. The tricuspid valve is grossly normal. Tricuspid valve regurgitation is trivial.  8. The aortic valve is tricuspid. Aortic valve regurgitation is not visualized.  9. The pulmonic valve was grossly normal. Pulmonic valve regurgitation is trivial. 10. Normal pulmonary artery systolic pressure. 11. The tricuspid regurgitant velocity is 1.78 m/s, and with  an assumed right atrial pressure of 3 mmHg, the estimated right ventricular systolic pressure is normal at 15.7 mmHg. 12. The inferior vena cava is normal in size with greater than 50% respiratory variability, suggesting right atrial pressure of 3 mmHg. FINDINGS  Left Ventricle: Left ventricular ejection fraction, by visual estimation, is 70 to 75%. The left ventricle has hyperdynamic function. The left ventricle has no regional wall motion abnormalities. The left ventricular internal cavity size was the left ventricle is normal in size. There is no left ventricular hypertrophy. Left ventricular diastolic parameters were normal. Right Ventricle: The right ventricular size is normal. Mildly increased right ventricular wall thickness. Global RV systolic function is has normal systolic function. The tricuspid regurgitant velocity is 1.78 m/s, and with an assumed right atrial pressure of 3 mmHg, the estimated right ventricular systolic pressure is  normal at 15.7 mmHg. Left Atrium: Left atrial size was normal in size. Right Atrium: Right atrial size was normal in size Pericardium: There is no evidence of pericardial effusion. Mitral Valve: The mitral valve is grossly normal. Trivial mitral valve regurgitation. Tricuspid Valve: The tricuspid valve is grossly normal. Tricuspid valve regurgitation is trivial. Aortic Valve: The aortic valve is tricuspid. Aortic valve regurgitation is not visualized. Mild aortic valve annular calcification. Pulmonic Valve: The pulmonic valve was grossly normal. Pulmonic valve regurgitation is trivial. Pulmonic regurgitation is trivial. Aorta: The aortic root is normal in size and structure. Venous: The inferior vena cava is normal in size with greater than 50% respiratory variability, suggesting right atrial pressure of 3 mmHg. IAS/Shunts: No atrial level shunt detected by color flow Doppler.  LEFT VENTRICLE PLAX 2D LVIDd:         3.84 cm       Diastology LVIDs:         2.06 cm       LV e'  lateral:   8.49 cm/s LV PW:         1.06 cm       LV E/e' lateral: 9.6 LV IVS:        0.94 cm       LV e' medial:    8.27 cm/s LVOT diam:     1.80 cm       LV E/e' medial:  9.9 LV SV:         50 ml LV SV Index:   31.00 LVOT Area:     2.54 cm  LV Volumes (MOD) LV area d, A2C:    17.90 cm LV area d, A4C:    19.40 cm LV area s, A2C:    8.21 cm LV area s, A4C:    9.25 cm LV major d, A2C:   6.67 cm LV major d, A4C:   6.52 cm LV major s, A2C:   5.14 cm LV major s, A4C:   4.74 cm LV vol d, MOD A2C: 40.1 ml LV vol d, MOD A4C: 47.4 ml LV vol s, MOD A2C: 11.2 ml LV vol s, MOD A4C: 15.1 ml LV SV MOD A2C:     28.9 ml LV SV MOD A4C:     47.4 ml LV SV MOD BP:      30.5 ml RIGHT VENTRICLE RV S prime:     12.60 cm/s TAPSE (M-mode): 2.5 cm LEFT ATRIUM             Index       RIGHT ATRIUM           Index LA diam:        2.70 cm 1.69 cm/m  RA Area:     17.70 cm LA Vol (A2C):   48.2 ml 30.21 ml/m RA Volume:   44.90 ml  28.14 ml/m LA Vol (A4C):   38.7 ml 24.26 ml/m LA Biplane Vol: 43.5 ml 27.27 ml/m  AORTIC VALVE LVOT Vmax:   119.00 cm/s LVOT Vmean:  85.600 cm/s LVOT VTI:    0.249 m  AORTA Ao Root diam: 2.80 cm MITRAL VALVE                        TRICUSPID VALVE MV Area (PHT): 3.65 cm             TR Peak grad:   12.7 mmHg MV PHT:        60.32 msec  TR Vmax:        178.00 cm/s MV Decel Time: 208 msec MV E velocity: 81.60 cm/s 103 cm/s  SHUNTS MV A velocity: 77.50 cm/s 70.3 cm/s Systemic VTI:  0.25 m MV E/A ratio:  1.05       1.5       Systemic Diam: 1.80 cm  Nona Dell MD Electronically signed by Nona Dell MD Signature Date/Time: 05/28/2019/4:42:36 PM    Final       Labs: BNP (last 3 results) No results for input(s): BNP in the last 8760 hours. Basic Metabolic Panel: Recent Labs  Lab 05/27/19 1144 05/27/19 1318 05/28/19 0119 05/29/19 0819  NA 140  --  143 143  K 3.1*  --  3.4* 3.8  CL 97*  --  112* 113*  CO2 30  --  27 25  GLUCOSE 125*  --  103* 132*  BUN 21  --  15 7*  CREATININE  1.23*  --  0.92 0.79  CALCIUM 8.7*  --  7.3* 8.1*  MG  --  1.7  --  1.6*   Liver Function Tests: Recent Labs  Lab 05/27/19 1144  AST 26  ALT 24  ALKPHOS 54  BILITOT 0.7  PROT 7.3  ALBUMIN 4.0   No results for input(s): LIPASE, AMYLASE in the last 168 hours. No results for input(s): AMMONIA in the last 168 hours. CBC: Recent Labs  Lab 05/27/19 1144 05/28/19 0119 05/29/19 0819  WBC 14.0* 7.8 5.3  NEUTROABS 11.0*  --   --   HGB 14.0 11.0* 11.0*  HCT 41.5 33.4* 33.8*  MCV 98.6 102.8* 102.7*  PLT 222 164 184   Cardiac Enzymes: No results for input(s): CKTOTAL, CKMB, CKMBINDEX, TROPONINI in the last 168 hours. BNP: Invalid input(s): POCBNP CBG: No results for input(s): GLUCAP in the last 168 hours. D-Dimer No results for input(s): DDIMER in the last 72 hours. Hgb A1c No results for input(s): HGBA1C in the last 72 hours. Lipid Profile No results for input(s): CHOL, HDL, LDLCALC, TRIG, CHOLHDL, LDLDIRECT in the last 72 hours. Thyroid function studies No results for input(s): TSH, T4TOTAL, T3FREE, THYROIDAB in the last 72 hours.  Invalid input(s): FREET3 Anemia work up No results for input(s): VITAMINB12, FOLATE, FERRITIN, TIBC, IRON, RETICCTPCT in the last 72 hours. Urinalysis    Component Value Date/Time   COLORURINE YELLOW 05/07/2019 1800   APPEARANCEUR CLEAR 05/07/2019 1800   LABSPEC 1.019 05/07/2019 1800   PHURINE 5.0 05/07/2019 1800   GLUCOSEU NEGATIVE 05/07/2019 1800   HGBUR SMALL (A) 05/07/2019 1800   BILIRUBINUR NEGATIVE 05/07/2019 1800   KETONESUR NEGATIVE 05/07/2019 1800   PROTEINUR NEGATIVE 05/07/2019 1800   UROBILINOGEN 0.2 04/01/2012 1815   NITRITE NEGATIVE 05/07/2019 1800   LEUKOCYTESUR NEGATIVE 05/07/2019 1800   Sepsis Labs Invalid input(s): PROCALCITONIN,  WBC,  LACTICIDVEN Microbiology Recent Results (from the past 240 hour(s))  SARS CORONAVIRUS 2 (TAT 6-24 HRS) Nasopharyngeal Nasopharyngeal Swab     Status: None   Collection Time:  05/27/19 11:30 PM   Specimen: Nasopharyngeal Swab  Result Value Ref Range Status   SARS Coronavirus 2 NEGATIVE NEGATIVE Final    Comment: (NOTE) SARS-CoV-2 target nucleic acids are NOT DETECTED. The SARS-CoV-2 RNA is generally detectable in upper and lower respiratory specimens during the acute phase of infection. Negative results do not preclude SARS-CoV-2 infection, do not rule out co-infections with other pathogens, and should not be used as the sole basis for treatment or other patient management decisions. Negative results  must be combined with clinical observations, patient history, and epidemiological information. The expected result is Negative. Fact Sheet for Patients: HairSlick.no Fact Sheet for Healthcare Providers: quierodirigir.com This test is not yet approved or cleared by the [place] FDA and  has been authorized for detection and/or diagnosis of SARS-CoV-2 by FDA under an Emergency Use Authorization (EUA). This EUA will remain  in effect (meaning this test can be used) for the duration of the COVID-19 declaration under Section 56 4(b)(1) of the Act, 21 U.S.C. section 360bbb-3(b)(1), unless the authorization is terminated or revoked sooner. Performed at [facility], [address], [place], [place] [address]      Total time spend on discharging this patient, including the last patient exam, discussing the hospital stay, instructions for ongoing care as it relates to all pertinent caregivers, as well as preparing the medical discharge records, prescriptions, and/or referrals as applicable, is 40 minutes.    Darlin Priestly, MD  [facility] 06/01/2019, 5:46 PM  If 7PM-7AM, please contact night-coverage

## 2019-05-29 NOTE — Care Management (Signed)
Coupon for Eliquis will be given by pharmacy during medication education.

## 2019-05-29 NOTE — Progress Notes (Signed)
  Nutrition Brief Note  Patient identified on the Malnutrition Screening Tool (MST) Report  Patient presents with chest pain and mild AKI. Reported poor po's the past 3 weeks. Current diet order is Heart Healthy, patient is consuming approximately 50% of meals at this time. Feeds herself.   CT chest findings for small pulmonary embolism. Recently treated for a UTI.   Wt Readings from Last 15 Encounters:  05/28/19 57.7 kg  05/07/19 53.6 kg  08/25/18 53.6 kg  08/09/18 53.7 kg  03/23/16 60.3 kg  03/22/16 60.3 kg  09/02/15 61.6 kg  06/25/15 63.4 kg  05/17/15 61.2 kg  09/13/14 59 kg  02/16/14 61.2 kg  04/03/12 53.6 kg  04/01/12 53.5 kg  03/24/12 53.5 kg   Weight history reviewed. Body mass index is 22.53 kg/m. Patient meets criteria for normal  based on current BMI. Based on weight history above- patient has gained weight during this year.   Labs and medications reviewed.   BMP Latest Ref Rng & Units 05/29/2019 05/28/2019 05/27/2019  Glucose 70 - 99 mg/dL 132(H) 103(H) 125(H)  BUN 8 - 23 mg/dL 7(L) 15 21  Creatinine 0.44 - 1.00 mg/dL 0.79 0.92 1.23(H)  Sodium 135 - 145 mmol/L 143 143 140  Potassium 3.5 - 5.1 mmol/L 3.8 3.4(L) 3.1(L)  Chloride 98 - 111 mmol/L 113(H) 112(H) 97(L)  CO2 22 - 32 mmol/L 25 27 30   Calcium 8.9 - 10.3 mg/dL 8.1(L) 7.3(L) 8.7(L)     No nutrition interventions warranted at this time. Patient is being discharge home today.   If nutrition issues arise, please consult RD.     Colman Cater MS,RD,CSG,LDN Office: [phone] Pager: [phone]

## 2019-05-29 NOTE — Evaluation (Signed)
Physical Therapy Evaluation Patient Details Name: [name] MRN: [id number] DOB: [birth year] Today's Date: 05/29/2019   History of Present Illness  [name] is a 73 y.o. female with medical history significant for hypothyroidism, stroke, anxiety, fibromyalgia, chronic pain.  Patient presented to the ED with complaints of chest pain described as pressure-like over the past several hours that started today.  She was lying down resting when the chest pain started.  She reports pain started in her left shoulder radiated down her arm, then to the middle of her chest radiating up to her jaw.  She denies associated difficulty breathing, but sister at bedside says she reported some to her earlier.    Clinical Impression  Patient functioning at baseline for functional mobility and gait.  Plan:  Patient discharged from physical therapy to care of nursing for ambulation daily as tolerated for length of stay.     Follow Up Recommendations No PT follow up    Equipment Recommendations  None recommended by PT    Recommendations for Other Services       Precautions / Restrictions Precautions Precautions: None Restrictions Weight Bearing Restrictions: No      Mobility  Bed Mobility Overal bed mobility: Modified Independent             General bed mobility comments: slightly increased time  Transfers Overall transfer level: Modified independent                  Ambulation/Gait Ambulation/Gait assistance: Modified independent (Device/Increase time) Gait Distance (Feet): 120 Feet Assistive device: None Gait Pattern/deviations: WFL(Within Functional Limits) Gait velocity: decreased   General Gait Details: demonstrates good return for ambulation in room/hallways without loss of balance  Stairs Stairs: Yes Stairs assistance: Modified independent (Device/Increase time) Stair Management: One rail Right;One rail Left Number of Stairs: 4 General stair comments:  demonstrates good return for going up/down stairs using 1 siderail without loss of balance  Wheelchair Mobility    Modified Rankin (Stroke Patients Only)       Balance Overall balance assessment: No apparent balance deficits (not formally assessed)                                           Pertinent Vitals/Pain Pain Assessment: Faces Faces Pain Scale: Hurts little more Pain Location: IV insertion right wrist Pain Descriptors / Indicators: Sore Pain Intervention(s): Limited activity within patient's tolerance;Monitored during session    Home Living Family/patient expects to be discharged to:: Private residence Living Arrangements: Alone Available Help at Discharge: Family;Friend(s);Available 24 hours/day Type of Home: House Home Access: Stairs to enter Entrance Stairs-Rails: (has rail in the middle) Entrance Stairs-Number of Steps: 6-7, has rail in the middle Home Layout: One level Home Equipment: Walker - 2 wheels;Cane - single point;Shower seat      Prior Function Level of Independence: Independent         Comments: Hydrographic surveyor, drives     Journalist, newspaper        Extremity/Trunk Assessment   Upper Extremity Assessment Upper Extremity Assessment: Overall WFL for tasks assessed    Lower Extremity Assessment Lower Extremity Assessment: Overall WFL for tasks assessed    Cervical / Trunk Assessment Cervical / Trunk Assessment: Normal  Communication   Communication: No difficulties  Cognition Arousal/Alertness: Awake/alert Behavior During Therapy: WFL for tasks assessed/performed Overall Cognitive Status: Within Functional Limits for tasks  assessed                                        General Comments      Exercises     Assessment/Plan    PT Assessment Patent does not need any further PT services  PT Problem List         PT Treatment Interventions      PT Goals (Current goals can be found in the Care  Plan section)  Acute Rehab PT Goals Patient Stated Goal: return home with family to assist PT Goal Formulation: With patient Time For Goal Achievement: 05/29/19 Potential to Achieve Goals: Good    Frequency     Barriers to discharge        Co-evaluation               AM-PAC PT "6 Clicks" Mobility  Outcome Measure Help needed turning from your back to your side while in a flat bed without using bedrails?: None Help needed moving from lying on your back to sitting on the side of a flat bed without using bedrails?: None Help needed moving to and from a bed to a chair (including a wheelchair)?: None Help needed standing up from a chair using your arms (e.g., wheelchair or bedside chair)?: None Help needed to walk in hospital room?: None Help needed climbing 3-5 steps with a railing? : None 6 Click Score: 24    End of Session   Activity Tolerance: Patient tolerated treatment well;Patient limited by fatigue Patient left: in chair;with call bell/phone within reach Nurse Communication: Mobility status PT Visit Diagnosis: Unsteadiness on feet (R26.81);Other abnormalities of gait and mobility (R26.89);Muscle weakness (generalized) (M62.81)    Time: 8921-1941 PT Time Calculation (min) (ACUTE ONLY): 24 min   Charges:   PT Evaluation $PT Eval Moderate Complexity: 1 Mod PT Treatments $Therapeutic Activity: 23-37 mins        9:46 AM, 05/29/19 Ocie Bob, MPT Physical Therapist with [facility] 336 [phone] office [phone] mobile phone

## 2019-05-29 NOTE — Discharge Instructions (Signed)
Information on my medicine - ELIQUIS (apixaban)  This medication education was reviewed with me or my healthcare representative as part of my discharge preparation.   Why was Eliquis prescribed for you? Eliquis was prescribed to treat blood clots that may have been found in the veins of your legs (deep vein thrombosis) or in your lungs (pulmonary embolism) and to reduce the risk of them occurring again.  What do You need to know about Eliquis ? The starting dose is 10 mg (two 5 mg tablets) taken TWICE daily for the FIRST SEVEN (7) DAYS, then on (enter date)  06/04/2019  the dose is reduced to ONE 5 mg tablet taken TWICE daily.  Eliquis may be taken with or without food.   Try to take the dose about the same time in the morning and in the evening. If you have difficulty swallowing the tablet whole please discuss with your pharmacist how to take the medication safely.  Take Eliquis exactly as prescribed and DO NOT stop taking Eliquis without talking to the doctor who prescribed the medication.  Stopping may increase your risk of developing a new blood clot.  Refill your prescription before you run out.  After discharge, you should have regular check-up appointments with your healthcare provider that is prescribing your Eliquis.    What do you do if you miss a dose? If a dose of ELIQUIS is not taken at the scheduled time, take it as soon as possible on the same day and twice-daily administration should be resumed. The dose should not be doubled to make up for a missed dose.  Important Safety Information A possible side effect of Eliquis is bleeding. You should call your healthcare provider right away if you experience any of the following: ? Bleeding from an injury or your nose that does not stop. ? Unusual colored urine (red or dark brown) or unusual colored stools (red or black). ? Unusual bruising for unknown reasons. ? A serious fall or if you hit your head (even if there is no  bleeding).  Some medicines may interact with Eliquis and might increase your risk of bleeding or clotting while on Eliquis. To help avoid this, consult your healthcare provider or pharmacist prior to using any new prescription or non-prescription medications, including herbals, vitamins, non-steroidal anti-inflammatory drugs (NSAIDs) and supplements.  This website has more information on Eliquis (apixaban): http://www.eliquis.com/eliquis/home

## 2019-05-29 NOTE — Progress Notes (Signed)
Patient sister called and voiced several concerns about patient medication and patient memory.  Sister states that patient is forgetful and is concerned about the amount of her medication she takes at home.  Sister also states that patient has been falling at home and needs help with hygiene and ADLS.

## 2019-06-11 DIAGNOSIS — H66012 Acute suppurative otitis media with spontaneous rupture of ear drum, left ear: Secondary | ICD-10-CM | POA: Diagnosis not present

## 2019-06-14 NOTE — Progress Notes (Signed)
CARDIOLOGY CONSULT NOTE       Patient ID: [name] MRN: [id number] DOB/AGE: 19-Jun-1945 73 y.o.  Admit date: (Not on file) Referring Physician: [place] Crane Primary Physician: Celene Squibb, MD Primary Cardiologist: New Reason for Consultation: Palpitations  Active Problems:   * No active hospital problems. *   HPI:  73 y.o. with history of fibromyalgia, chronic back pain on narcotics and limited activity  Seen in ER 05/07/19 with accidental overdose taking 60 fioricets in 5 days in addition to her librium and oxycodone   Seen by Dr Lovena Le in 2013 for carotid bruit and atypical chest pain with normal ETT and duplex showing only mild plaque in ICA;s Hospitalized at [facility] 05/27/19 with chest pain atypical left shoulder radiating to arm poor PO intake and recent Rx for UTI R/O MI no acute ECG changes CTA with small PE in right upper lobe. Complained of palpitations while in hospital Telemetry no arrhythmias but started on propanolol   Echo reviewed 05/28/19 EF 70-75% normal RV function no cor pulmonale trivial MR/TR LE venous Duplex with no DVT D/C home on eliquis   Daughter helps care for her has peripheral neuropathy and poor gait   Sister Marcie Bal with her today 4 sisters all live close by except one with dementia in [place] near son now   ROS All other systems reviewed and negative except as noted above  Past Medical History:  Diagnosis Date  . Anxiety   . Back pain   . Fibromyalgia   . Hyperlipemia   . Hypothyroidism   . Neuropathy   . Sinusitis   . Stroke [facility])    no deficits    Family History  Problem Relation Age of Onset  . Depression Mother   . Depression Father   . Depression Sister   . Anxiety disorder Sister   . Anxiety disorder Sister   . Alcohol abuse Neg Hx   . Drug abuse Neg Hx     Social History   Socioeconomic History  . Marital status: Married    Spouse name: Not on file  . Number of children: Not on file  . Years of education: Not on file  .  Highest education level: Not on file  Occupational History  . Not on file  Tobacco Use  . Smoking status: Current Every Day Smoker    Packs/day: 0.25    Years: 50.00    Pack years: 12.50    Types: Cigarettes  . Smokeless tobacco: Never Used  . Tobacco comment: smokes 1/2 pack every 2 day.  Substance and Sexual Activity  . Alcohol use: No    Alcohol/week: 0.0 standard drinks  . Drug use: No  . Sexual activity: Never    Birth control/protection: Surgical  Other Topics Concern  . Not on file  Social History Narrative  . Not on file   Social Determinants of Health   Financial Resource Strain:   . Difficulty of Paying Living Expenses: Not on file  Food Insecurity:   . Worried About Charity fundraiser in the Last Year: Not on file  . Ran Out of Food in the Last Year: Not on file  Transportation Needs:   . Lack of Transportation (Medical): Not on file  . Lack of Transportation (Non-Medical): Not on file  Physical Activity:   . Days of Exercise per Week: Not on file  . Minutes of Exercise per Session: Not on file  Stress:   . Feeling of Stress : Not  on file  Social Connections:   . Frequency of Communication with Friends and Family: Not on file  . Frequency of Social Gatherings with Friends and Family: Not on file  . Attends Religious Services: Not on file  . Active Member of Clubs or Organizations: Not on file  . Attends Banker Meetings: Not on file  . Marital Status: Not on file  Intimate Partner Violence:   . Fear of Current or Ex-Partner: Not on file  . Emotionally Abused: Not on file  . Physically Abused: Not on file  . Sexually Abused: Not on file    Past Surgical History:  Procedure Laterality Date  . ABDOMINAL HYSTERECTOMY    . BIOPSY  08/25/2018   Procedure: BIOPSY;  Surgeon: Malissa Hippo, MD;  Location: [facility];  Service: Endoscopy;;  . CATARACT EXTRACTION Bilateral   . CHOLECYSTECTOMY    . ESOPHAGEAL DILATION N/A 06/27/2015    Procedure: ESOPHAGEAL DILATION;  Surgeon: Malissa Hippo, MD;  Location: [facility];  Service: Endoscopy;  Laterality: N/A;  . ESOPHAGEAL DILATION N/A 08/25/2018   Procedure: ESOPHAGEAL DILATION;  Surgeon: Malissa Hippo, MD;  Location: [facility];  Service: Endoscopy;  Laterality: N/A;  . ESOPHAGOGASTRODUODENOSCOPY (EGD) WITH PROPOFOL N/A 06/27/2015   Procedure: ESOPHAGOGASTRODUODENOSCOPY (EGD) WITH PROPOFOL;  Surgeon: Malissa Hippo, MD;  Location: [facility];  Service: Endoscopy;  Laterality: N/A;  . ESOPHAGOGASTRODUODENOSCOPY (EGD) WITH PROPOFOL N/A 08/25/2018   Procedure: ESOPHAGOGASTRODUODENOSCOPY (EGD) WITH PROPOFOL;  Surgeon: Malissa Hippo, MD;  Location: [facility];  Service: Endoscopy;  Laterality: N/A;  9:30        Physical Exam: Blood pressure 122/61, pulse (!) 56, temperature (!) 96.6 F (35.9 C), height  (1.6 m), weight 126 lb 9.6 oz (57.4 kg), SpO2 100 %.    Affect appropriate Chronically ill female  HEENT: normal Neck supple with no adenopathy JVP normal no  bruits no thyromegaly Lungs clear with no wheezing and good diaphragmatic motion Heart:  S1/S2 no murmur, no rub, gallop or click PMI normal Abdomen: benighn, BS positve, no tenderness, no AAA no bruit.  No HSM or HJR Distal pulses intact with no bruits No edema Neuro non-focal Skin warm and dry No muscular weakness   Labs:   Lab Results  Component Value Date   WBC 5.3 05/29/2019   HGB 11.0 (L) 05/29/2019   HCT 33.8 (L) 05/29/2019   MCV 102.7 (H) 05/29/2019   PLT 184 05/29/2019   No results for input(s): NA, K, CL, CO2, BUN, CREATININE, CALCIUM, PROT, BILITOT, ALKPHOS, ALT, AST, GLUCOSE in the last 168 hours.  Invalid input(s): LABALBU Lab Results  Component Value Date   TROPONINI <0.03 05/17/2015   No results found for: CHOL No results found for: HDL No results found for: LDLCALC No results found for: TRIG No results found for: CHOLHDL No results found for: LDLDIRECT      Radiology: CT Angio Chest PE W and/or Wo Contrast  Result Date: 05/27/2019 CLINICAL DATA:  Shortness of breath.  Back pain. EXAM: CT ANGIOGRAPHY CHEST WITH CONTRAST TECHNIQUE: Multidetector CT imaging of the chest was performed using the standard protocol during bolus administration of intravenous contrast. Multiplanar CT image reconstructions and MIPs were obtained to evaluate the vascular anatomy. CONTRAST:  OMNIPAQUE IOHEXOL 350 MG/ML SOLN COMPARISON:  May 17, 2015. FINDINGS: Cardiovascular: Small filling defect is seen in upper lobe branch of right pulmonary artery concerning for pulmonary embolus. Normal heart size. No pericardial  effusion. Atherosclerosis of thoracic aorta is noted without aneurysm or dissection. Mediastinum/Nodes: No enlarged mediastinal, hilar, or axillary lymph nodes. Thyroid gland, trachea, and esophagus demonstrate no significant findings. Lungs/Pleura: No pneumothorax or pleural effusion is noted. Mild right posterior basilar subsegmental atelectasis is noted. Upper Abdomen: No acute abnormality. Musculoskeletal: No chest wall abnormality. No acute or significant osseous findings. Review of the MIP images confirms the above findings. IMPRESSION: Small pulmonary embolus is noted in upper lobe branch of right pulmonary artery. Critical Value/emergent results were called by telephone at the time of interpretation on 05/27/2019 at 3:13 pm to providerJOSEPH ZAMMIT , who verbally acknowledged these results. Aortic Atherosclerosis (ICD10-I70.0). Electronically Signed   By: Lupita Raider M.D.   On: 05/27/2019 15:14   US Venous Img Lower Bilateral (DVT)  Result Date: 05/28/2019 CLINICAL DATA:  Pulmonary embolism. History of previous DVT. Evaluate acute or chronic DVT. EXAM: BILATERAL LOWER EXTREMITY VENOUS DOPPLER ULTRASOUND TECHNIQUE: Gray-scale sonography with graded compression, as well as color Doppler and duplex ultrasound were performed to evaluate the lower extremity  deep venous systems from the level of the common femoral vein and including the common femoral, femoral, profunda femoral, popliteal and calf veins including the posterior tibial, peroneal and gastrocnemius veins when visible. The superficial great saphenous vein was also interrogated. Spectral Doppler was utilized to evaluate flow at rest and with distal augmentation maneuvers in the common femoral, femoral and popliteal veins. COMPARISON:  None. FINDINGS: RIGHT LOWER EXTREMITY Common Femoral Vein: No evidence of thrombus. Normal compressibility, respiratory phasicity and response to augmentation. Saphenofemoral Junction: No evidence of thrombus. Normal compressibility and flow on color Doppler imaging. Profunda Femoral Vein: No evidence of thrombus. Normal compressibility and flow on color Doppler imaging. Femoral Vein: No evidence of thrombus. Normal compressibility, respiratory phasicity and response to augmentation. Popliteal Vein: No evidence of thrombus. Normal compressibility, respiratory phasicity and response to augmentation. Calf Veins: No evidence of thrombus. Normal compressibility and flow on color Doppler imaging. Superficial Great Saphenous Vein: No evidence of thrombus. Normal compressibility. Venous Reflux:  None. Other Findings:  None. LEFT LOWER EXTREMITY Common Femoral Vein: No evidence of thrombus. Normal compressibility, respiratory phasicity and response to augmentation. Saphenofemoral Junction: No evidence of thrombus. Normal compressibility and flow on color Doppler imaging. Profunda Femoral Vein: No evidence of thrombus. Normal compressibility and flow on color Doppler imaging. Femoral Vein: No evidence of thrombus. Normal compressibility, respiratory phasicity and response to augmentation. Popliteal Vein: No evidence of thrombus. Normal compressibility, respiratory phasicity and response to augmentation. Calf Veins: No evidence of thrombus. Normal compressibility and flow on color Doppler  imaging. Superficial Great Saphenous Vein: No evidence of thrombus. Normal compressibility. Venous Reflux:  None. Other Findings:  None. IMPRESSION: No evidence of acute or chronic DVT within either lower extremity. Electronically Signed   By: Simonne Come M.D.   On: 05/28/2019 15:32   DG Chest Portable 1 View  Result Date: 05/27/2019 CLINICAL DATA:  Pt states she has been having back pain and chest pressure for several hours. Pt was recently taken off her pain medications due to pt overtaking them. EXAM: PORTABLE CHEST 1 VIEW COMPARISON:  05/07/2019. FINDINGS: Cardiac silhouette is normal in size. No mediastinal or hilar masses. No evidence of adenopathy. Clear lungs.  No pleural effusion or pneumothorax. Skeletal structures are grossly intact. IMPRESSION: No active disease. Electronically Signed   By: Amie Portland M.D.   On: 05/27/2019 12:15   ECHOCARDIOGRAM COMPLETE  Result Date: 05/28/2019   ECHOCARDIOGRAM REPORT  Patient Name:   [name] Date of Exam: 05/28/2019 Medical Rec #:  [id number]       Height:       63.0 in Accession #:    [id number]      Weight:       127.2 lb Date of Birth:  [birth year]       BSA:          1.60 m Patient Age:    73 years        BP:           100/57 mmHg Patient Gender: F               HR:           85 bpm. [facility] Procedure: 2D Echo, Cardiac Doppler and Color Doppler Indications:    Pulmonary Embolus 415.19 / I26.99  History:        Patient has no prior history of Echocardiogram examinations.                 Risk Factors:Dyslipidemia. Fibromyalgia,Anxiety, silicone breast                 implants x 40 years, H/O stroke.  Sonographer:    Celesta Gentile RCS Referring Phys: [phone] Heloise Beecham [facility]  Sonographer Comments: Patient extrememly sore and tinder around chest area. per Patient she fell 5-6 times at home recently also. IMPRESSIONS  1. Left ventricular ejection fraction, by visual estimation, is 70 to 75%. The left ventricle has hyperdynamic  function. There is no left ventricular hypertrophy.  2. The left ventricle has no regional wall motion abnormalities.  3. Global right ventricle has normal systolic function.The right ventricular size is normal. Mildly increased right ventricular wall thickness.  4. Left atrial size was normal.  5. Right atrial size was normal.  6. The mitral valve is grossly normal. Trivial mitral valve regurgitation.  7. The tricuspid valve is grossly normal. Tricuspid valve regurgitation is trivial.  8. The aortic valve is tricuspid. Aortic valve regurgitation is not visualized.  9. The pulmonic valve was grossly normal. Pulmonic valve regurgitation is trivial. 10. Normal pulmonary artery systolic pressure. 11. The tricuspid regurgitant velocity is 1.78 m/s, and with an assumed right atrial pressure of 3 mmHg, the estimated right ventricular systolic pressure is normal at 15.7 mmHg. 12. The inferior vena cava is normal in size with greater than 50% respiratory variability, suggesting right atrial pressure of 3 mmHg. FINDINGS  Left Ventricle: Left ventricular ejection fraction, by visual estimation, is 70 to 75%. The left ventricle has hyperdynamic function. The left ventricle has no regional wall motion abnormalities. The left ventricular internal cavity size was the left ventricle is normal in size. There is no left ventricular hypertrophy. Left ventricular diastolic parameters were normal. Right Ventricle: The right ventricular size is normal. Mildly increased right ventricular wall thickness. Global RV systolic function is has normal systolic function. The tricuspid regurgitant velocity is 1.78 m/s, and with an assumed right atrial pressure of 3 mmHg, the estimated right ventricular systolic pressure is normal at 15.7 mmHg. Left Atrium: Left atrial size was normal in size. Right Atrium: Right atrial size was normal in size Pericardium: There is no evidence of pericardial effusion. Mitral Valve: The mitral valve is grossly  normal. Trivial mitral valve regurgitation. Tricuspid Valve: The tricuspid valve is grossly normal. Tricuspid valve regurgitation is trivial. Aortic Valve: The aortic valve is tricuspid. Aortic valve regurgitation is not visualized.  Mild aortic valve annular calcification. Pulmonic Valve: The pulmonic valve was grossly normal. Pulmonic valve regurgitation is trivial. Pulmonic regurgitation is trivial. Aorta: The aortic root is normal in size and structure. Venous: The inferior vena cava is normal in size with greater than 50% respiratory variability, suggesting right atrial pressure of 3 mmHg. IAS/Shunts: No atrial level shunt detected by color flow Doppler.  LEFT VENTRICLE PLAX 2D LVIDd:         3.84 cm       Diastology LVIDs:         2.06 cm       LV e' lateral:   8.49 cm/s LV PW:         1.06 cm       LV E/e' lateral: 9.6 LV IVS:        0.94 cm       LV e' medial:    8.27 cm/s LVOT diam:     1.80 cm       LV E/e' medial:  9.9 LV SV:         50 ml LV SV Index:   31.00 LVOT Area:     2.54 cm  LV Volumes (MOD) LV area d, A2C:    17.90 cm LV area d, A4C:    19.40 cm LV area s, A2C:    8.21 cm LV area s, A4C:    9.25 cm LV major d, A2C:   6.67 cm LV major d, A4C:   6.52 cm LV major s, A2C:   5.14 cm LV major s, A4C:   4.74 cm LV vol d, MOD A2C: 40.1 ml LV vol d, MOD A4C: 47.4 ml LV vol s, MOD A2C: 11.2 ml LV vol s, MOD A4C: 15.1 ml LV SV MOD A2C:     28.9 ml LV SV MOD A4C:     47.4 ml LV SV MOD BP:      30.5 ml RIGHT VENTRICLE RV S prime:     12.60 cm/s TAPSE (M-mode): 2.5 cm LEFT ATRIUM             Index       RIGHT ATRIUM           Index LA diam:        2.70 cm 1.69 cm/m  RA Area:     17.70 cm LA Vol (A2C):   48.2 ml 30.21 ml/m RA Volume:   44.90 ml  28.14 ml/m LA Vol (A4C):   38.7 ml 24.26 ml/m LA Biplane Vol: 43.5 ml 27.27 ml/m  AORTIC VALVE LVOT Vmax:   119.00 cm/s LVOT Vmean:  85.600 cm/s LVOT VTI:    0.249 m  AORTA Ao Root diam: 2.80 cm MITRAL VALVE                        TRICUSPID VALVE MV Area  (PHT): 3.65 cm             TR Peak grad:   12.7 mmHg MV PHT:        60.32 msec           TR Vmax:        178.00 cm/s MV Decel Time: 208 msec MV E velocity: 81.60 cm/s 103 cm/s  SHUNTS MV A velocity: 77.50 cm/s 70.3 cm/s Systemic VTI:  0.25 m MV E/A ratio:  1.05       1.5       Systemic Diam: 1.80 cm  Nona Dell MD Electronically signed by Nona Dell MD Signature Date/Time: 05/28/2019/4:42:36 PM    Final     EKG: SR rate 82 ICRBBB no acute changes 05/28/19   ASSESSMENT AND PLAN:   1. Palpitations:  Continue propanolol No arrhythmias on telemetry when in hospital for PE  She has had one episode Lasting seconds given normal echo no need for further testing. If it becomes more frequent can consider 14 day event monitor  2. PE:  F/u with primary continue eliquis [place] negative for DVT Given fact that she is sedentary at risk but sister also had "clots in legs" consider referral to hematology to r/o hypercoagulable state Per primary   3. HLD:  On statin   4. Carotid Bruit:  Previous duplex 2013 plaque no stenosis no bruit on exam today   5. Fibromyalgia/Chronic Back pain:  Narcotics weaned on flexeril and neurontin f/u with primary   F/U with cardiology PRN   Signed: Charlton Haws 06/20/2019, 2:39 PM

## 2019-06-18 DIAGNOSIS — F332 Major depressive disorder, recurrent severe without psychotic features: Secondary | ICD-10-CM | POA: Diagnosis not present

## 2019-06-18 DIAGNOSIS — I2699 Other pulmonary embolism without acute cor pulmonale: Secondary | ICD-10-CM | POA: Diagnosis not present

## 2019-06-18 DIAGNOSIS — H66012 Acute suppurative otitis media with spontaneous rupture of ear drum, left ear: Secondary | ICD-10-CM | POA: Diagnosis not present

## 2019-06-20 ENCOUNTER — Ambulatory Visit (INDEPENDENT_AMBULATORY_CARE_PROVIDER_SITE_OTHER): Payer: PPO | Admitting: Cardiovascular Disease

## 2019-06-20 ENCOUNTER — Other Ambulatory Visit: Payer: Self-pay

## 2019-06-20 VITALS — BP 122/61 | HR 56 | Temp 96.6°F | Ht 63.0 in | Wt 126.6 lb

## 2019-06-20 DIAGNOSIS — R000 Tachycardia, unspecified: Secondary | ICD-10-CM | POA: Diagnosis not present

## 2019-06-20 DIAGNOSIS — E782 Mixed hyperlipidemia: Secondary | ICD-10-CM | POA: Diagnosis not present

## 2019-06-20 DIAGNOSIS — R002 Palpitations: Secondary | ICD-10-CM

## 2019-06-20 DIAGNOSIS — E039 Hypothyroidism, unspecified: Secondary | ICD-10-CM | POA: Diagnosis not present

## 2019-06-20 DIAGNOSIS — F331 Major depressive disorder, recurrent, moderate: Secondary | ICD-10-CM | POA: Diagnosis not present

## 2019-06-20 DIAGNOSIS — G43019 Migraine without aura, intractable, without status migrainosus: Secondary | ICD-10-CM | POA: Diagnosis not present

## 2019-06-20 DIAGNOSIS — R7301 Impaired fasting glucose: Secondary | ICD-10-CM | POA: Diagnosis not present

## 2019-06-20 DIAGNOSIS — E785 Hyperlipidemia, unspecified: Secondary | ICD-10-CM | POA: Diagnosis not present

## 2019-06-20 NOTE — Patient Instructions (Signed)
Medication Instructions:  Your physician recommends that you continue on your current medications as directed. Please refer to the Current Medication list given to you today.   *If you need a refill on your cardiac medications before your next appointment, please call your pharmacy*  Lab Work: NONE   If you have labs (blood work) drawn today and your tests are completely normal, you will receive your results only by: . MyChart Message (if you have MyChart) OR . A paper copy in the mail If you have any lab test that is abnormal or we need to change your treatment, we will call you to review the results.  Testing/Procedures: NONE   Follow-Up: At [facility], you and your health needs are our priority.  As part of our continuing mission to provide you with exceptional heart care, we have created designated Provider Care Teams.  These Care Teams include your primary Cardiologist (physician) and Advanced Practice Providers (APPs -  Physician Assistants and Nurse Practitioners) who all work together to provide you with the care you need, when you need it.  Your next appointment:    As Needed   The format for your next appointment:   Either In Person or Virtual  Provider:   Peter Nishan, MD  Other Instructions Thank you for choosing [facility]!    

## 2019-06-28 DIAGNOSIS — H6123 Impacted cerumen, bilateral: Secondary | ICD-10-CM | POA: Diagnosis not present

## 2019-06-28 DIAGNOSIS — H60332 Swimmer's ear, left ear: Secondary | ICD-10-CM | POA: Diagnosis not present

## 2019-07-02 ENCOUNTER — Emergency Department (HOSPITAL_COMMUNITY)
Admission: EM | Admit: 2019-07-02 | Discharge: 2019-07-02 | Disposition: A | Discharge status: Home / Self Care | Payer: PPO | Attending: Emergency Medicine | Admitting: Emergency Medicine

## 2019-07-02 ENCOUNTER — Other Ambulatory Visit: Payer: Self-pay

## 2019-07-02 ENCOUNTER — Encounter (HOSPITAL_COMMUNITY): Payer: Self-pay | Admitting: Emergency Medicine

## 2019-07-02 ENCOUNTER — Emergency Department (HOSPITAL_COMMUNITY): Payer: PPO

## 2019-07-02 DIAGNOSIS — E039 Hypothyroidism, unspecified: Secondary | ICD-10-CM | POA: Diagnosis not present

## 2019-07-02 DIAGNOSIS — I2699 Other pulmonary embolism without acute cor pulmonale: Secondary | ICD-10-CM | POA: Diagnosis not present

## 2019-07-02 DIAGNOSIS — Y92008 Other place in unspecified non-institutional (private) residence as the place of occurrence of the external cause: Secondary | ICD-10-CM | POA: Diagnosis not present

## 2019-07-02 DIAGNOSIS — W01198A Fall on same level from slipping, tripping and stumbling with subsequent striking against other object, initial encounter: Secondary | ICD-10-CM | POA: Insufficient documentation

## 2019-07-02 DIAGNOSIS — Y939 Activity, unspecified: Secondary | ICD-10-CM | POA: Diagnosis not present

## 2019-07-02 DIAGNOSIS — S199XXA Unspecified injury of neck, initial encounter: Secondary | ICD-10-CM | POA: Diagnosis not present

## 2019-07-02 DIAGNOSIS — Z7901 Long term (current) use of anticoagulants: Secondary | ICD-10-CM | POA: Diagnosis not present

## 2019-07-02 DIAGNOSIS — E876 Hypokalemia: Secondary | ICD-10-CM | POA: Diagnosis not present

## 2019-07-02 DIAGNOSIS — H7292 Unspecified perforation of tympanic membrane, left ear: Secondary | ICD-10-CM | POA: Diagnosis not present

## 2019-07-02 DIAGNOSIS — S0990XA Unspecified injury of head, initial encounter: Secondary | ICD-10-CM | POA: Insufficient documentation

## 2019-07-02 DIAGNOSIS — R296 Repeated falls: Secondary | ICD-10-CM | POA: Insufficient documentation

## 2019-07-02 DIAGNOSIS — Y999 Unspecified external cause status: Secondary | ICD-10-CM | POA: Diagnosis not present

## 2019-07-02 DIAGNOSIS — F17210 Nicotine dependence, cigarettes, uncomplicated: Secondary | ICD-10-CM | POA: Diagnosis not present

## 2019-07-02 DIAGNOSIS — Z79899 Other long term (current) drug therapy: Secondary | ICD-10-CM | POA: Insufficient documentation

## 2019-07-02 LAB — URINALYSIS, ROUTINE W REFLEX MICROSCOPIC
Bacteria, UA: NONE SEEN
Bilirubin Urine: NEGATIVE
Glucose, UA: NEGATIVE mg/dL
Ketones, ur: NEGATIVE mg/dL
Leukocytes,Ua: NEGATIVE
Nitrite: NEGATIVE
Protein, ur: NEGATIVE mg/dL
Specific Gravity, Urine: 1.013 (ref 1.005–1.030)
pH: 6 (ref 5.0–8.0)

## 2019-07-02 LAB — COMPREHENSIVE METABOLIC PANEL
ALT: 16 U/L (ref 0–44)
AST: 17 U/L (ref 15–41)
Albumin: 3.5 g/dL (ref 3.5–5.0)
Alkaline Phosphatase: 49 U/L (ref 38–126)
Anion gap: 7 (ref 5–15)
BUN: 11 mg/dL (ref 8–23)
CO2: 28 mmol/L (ref 22–32)
Calcium: 8.7 mg/dL — ABNORMAL LOW (ref 8.9–10.3)
Chloride: 108 mmol/L (ref 98–111)
Creatinine, Ser: 0.99 mg/dL (ref 0.44–1.00)
GFR calc Af Amer: >60 mL/min (ref >60)
GFR calc non Af Amer: 57 mL/min — ABNORMAL LOW (ref >60)
Glucose, Bld: 89 mg/dL (ref 70–99)
Potassium: 3.2 mmol/L — ABNORMAL LOW (ref 3.5–5.1)
Sodium: 143 mmol/L (ref 135–145)
Total Bilirubin: 0.4 mg/dL (ref 0.3–1.2)
Total Protein: 6.2 g/dL — ABNORMAL LOW (ref 6.5–8.1)

## 2019-07-02 LAB — RAPID URINE DRUG SCREEN, HOSP PERFORMED
Amphetamines: NOT DETECTED
Barbiturates: NOT DETECTED
Benzodiazepines: POSITIVE — AB
Cocaine: NOT DETECTED
Opiates: NOT DETECTED
Tetrahydrocannabinol: NOT DETECTED

## 2019-07-02 LAB — CBC WITH DIFFERENTIAL/PLATELET
Abs Immature Granulocytes: 0.02 10*3/uL (ref 0.00–0.07)
Basophils Absolute: 0.1 10*3/uL (ref 0.0–0.1)
Basophils Relative: 1 %
Eosinophils Absolute: 0.2 10*3/uL (ref 0.0–0.5)
Eosinophils Relative: 3 %
HCT: 31.9 % — ABNORMAL LOW (ref 36.0–46.0)
Hemoglobin: 10.3 g/dL — ABNORMAL LOW (ref 12.0–15.0)
Immature Granulocytes: 0 %
Lymphocytes Relative: 33 %
Lymphs Abs: 2.5 10*3/uL (ref 0.7–4.0)
MCH: 33.8 pg (ref 26.0–34.0)
MCHC: 32.3 g/dL (ref 30.0–36.0)
MCV: 104.6 fL — ABNORMAL HIGH (ref 80.0–100.0)
Monocytes Absolute: 0.6 10*3/uL (ref 0.1–1.0)
Monocytes Relative: 8 %
Neutro Abs: 4 10*3/uL (ref 1.7–7.7)
Neutrophils Relative %: 55 %
Platelets: 213 10*3/uL (ref 150–400)
RBC: 3.05 MIL/uL — ABNORMAL LOW (ref 3.87–5.11)
RDW: 13.3 % (ref 11.5–15.5)
WBC: 7.4 10*3/uL (ref 4.0–10.5)
nRBC: 0 % (ref 0.0–0.2)

## 2019-07-02 MED ORDER — SODIUM CHLORIDE 0.9 % IV BOLUS
500.0000 mL | Freq: Once | INTRAVENOUS | Status: AC
  Administered 2019-07-02: 500 mL via INTRAVENOUS

## 2019-07-02 MED ORDER — POTASSIUM CHLORIDE CRYS ER 20 MEQ PO TBCR
40.0000 meq | EXTENDED_RELEASE_TABLET | Freq: Once | ORAL | Status: AC
  Administered 2019-07-02: 17:00:00 40 meq via ORAL
  Filled 2019-07-02: qty 2

## 2019-07-02 NOTE — ED Notes (Signed)
Consulted with PA. No new orders given at this time.

## 2019-07-02 NOTE — Discharge Instructions (Signed)
For now you should stop your blood thinner for at least 2-3 days.  Overall you need to have a discussion with your primary care physician about whether or not you should be on the blood thinner Eliquis.  Continue placing the Cipro eardrops in your left ear.

## 2019-07-02 NOTE — ED Provider Notes (Signed)
[facility] EMERGENCY DEPARTMENT Provider Note   CSN: [id number] Arrival date & time: 07/02/19  1118     History Chief Complaint  Patient presents with  . Fall    [name] is a 74 y.o. female.  HPI 74 year old female presents with frequent falls and concern for injury.  Patient has been falling nearly daily since discharge from the hospital last month.  She was diagnosed with a pulmonary embolus and placed on Eliquis.  She has been having daily blood from her ear, mostly left-sided but sometimes right when she lays on her right side.  On and off blood of her nose.  Saw ENT but because looking in her ear would induce bleeding he could not fully evaluate.  Otherwise she has hit her head often when she falls.  This includes yesterday and today.  Yesterday she fell and injured her left elbow with some bruising.  She denies shortness of breath, chest pain, weakness or numbness.  States she is walking with a walker but often times falling despite this.  She will not feel dizzy or lightheaded and is not passing out.  She is not sure why she is falling over.   Past Medical History:  Diagnosis Date  . Anxiety   . Back pain   . Fibromyalgia   . Hyperlipemia   . Hypothyroidism   . Neuropathy   . Sinusitis   . Stroke [facility])    no deficits    Patient Active Problem List   Diagnosis Date Noted  . PE (pulmonary thromboembolism) ([place]) 05/27/2019  . Esophageal dysphagia 08/10/2018  . Back pain 05/05/2012  . Fibromyalgia 05/05/2012  . Chest pressure 04/03/2012  . Carotid bruit 04/03/2012  . Anxiety 10/05/2011    Past Surgical History:  Procedure Laterality Date  . ABDOMINAL HYSTERECTOMY    . BIOPSY  08/25/2018   Procedure: BIOPSY;  Surgeon: Rogene Houston, MD;  Location: [facility] ENDO SUITE;  Service: Endoscopy;;  . CATARACT EXTRACTION Bilateral   . CHOLECYSTECTOMY    . ESOPHAGEAL DILATION N/A 06/27/2015   Procedure: ESOPHAGEAL DILATION;  Surgeon: Rogene Houston, MD;  Location: [facility]  ENDO SUITE;  Service: Endoscopy;  Laterality: N/A;  . ESOPHAGEAL DILATION N/A 08/25/2018   Procedure: ESOPHAGEAL DILATION;  Surgeon: Rogene Houston, MD;  Location: [facility] ENDO SU[facility];  Service: Endoscopy;  Laterality: N/A;  . ESOPHAGOGASTRODUODENOSCOPY (EGD) WITH PROPOFOL N/A 06/27/2015   Procedure: ESOPHAGOGASTRODUODENOSCOPY (EGD) WITH PROPOFOL;  Surgeon: Rogene Houston, MD;  Location: [facility] END[facility] SU[facility];  Service: Endoscopy;  Laterality: N/A;  . ESOPHAGOGASTRODUODENOSCOPY (EGD) WITH PROPOFOL N/A 08/25/2018   Procedure: ESOPHAGOGASTRODUODENOSCOPY (EGD) WITH PROPOFOL;  Surgeon: Rogene Houston, MD;  Location: [facility] END[facility];  Service: Endoscopy;  Laterality: N/A;  9:30     OB History   No obstetric history on file.     Family History  Problem Relation Age of Onset  . Depression Mother   . Depression Father   . Depression Sister   . Anxiety disorder Sister   . Anxiety disorder Sister   . Alcohol abuse Neg Hx   . Drug abuse Neg Hx     Social History   Tobacco Use  . Smoking status: Current Every Day Smoker    Packs/day: 0.25    Years: 50.00    Pack years: 12.50    Types: Cigarettes  . Smokeless tobacco: Never Used  . Tobacco comment: smokes 1/2 pack every 2 day.  Substance Use Topics  . Alcohol use: No  Alcohol/week: 0.0 standard drinks  . Drug use: No    Home Medications Prior to Admission medications   Medication Sig Start Date End Date Taking? Authorizing Provider  amitriptyline (ELAVIL) 75 MG tablet Take 1 tablet (75 mg total) by mouth 2 (two) times daily. 07/02/19   Neysa Hotter, MD  Apixaban Starter Pack (ELIQUIS DVT/PE STARTER PACK) 5 MG TBPK Take as directed on package: start with two-5mg  tablets twice daily for 7 days. On day 8, switch to one-5mg  tablet twice daily. 05/29/19   Darlin Priestly, MD  chlordiazePOXIDE (LIBRIUM) 25 MG capsule Take 25 mg by mouth 2 (two) times daily. 06/29/19   [provider]  cyclobenzaprine (FLEXERIL) 10 MG tablet Take 10 mg by mouth 3  (three) times daily as needed for muscle spasms.  12/19/12   [provider]  furosemide (LASIX) 40 MG tablet Hold until outpatient followup because your blood pressure was low normal. Patient not taking: Reported on 06/20/2019 05/29/19   Darlin Priestly, MD  gabapentin (NEURONTIN) 100 MG capsule Take 100 mg by mouth 3 (three) times daily.     [provider]  meclizine (ANTIVERT) 25 MG tablet Take 25 mg by mouth 3 (three) times daily as needed for dizziness.    [provider]  metoCLOPramide (REGLAN) 5 MG tablet Take 5 mg by mouth every 8 (eight) hours as needed for nausea.     [provider]  pantoprazole (PROTONIX) 40 MG tablet Take 1 tablet (40 mg total) by mouth daily before breakfast. 08/25/18   Rehman, Joline Maxcy, MD  propranolol (INDERAL) 40 MG tablet Take 40 mg by mouth 2 (two) times daily.     [provider]  simvastatin (ZOCOR) 40 MG tablet Take 40 mg by mouth at bedtime.  04/29/14   [provider]    Allergies    Codeine and Prednisone  Review of Systems   Review of Systems  Constitutional: Negative for fever.  HENT: Positive for ear discharge (blood) and nosebleeds. Negative for ear pain.   Respiratory: Negative for shortness of breath.   Cardiovascular: Negative for chest pain.  Gastrointestinal: Negative for vomiting.  Neurological: Positive for headaches. Negative for dizziness, syncope, weakness, light-headedness and numbness.  All other systems reviewed and are negative.   Physical Exam Updated Vital Signs BP 127/73 (BP Location: Right Arm)   Pulse (!) 59   Temp 97.9 F (36.6 C) (Oral)   Resp 16   Ht 5\' 3"  (1.6 m)   Wt 55.3 kg   SpO2 96%   BMI 21.61 kg/m   Physical Exam Vitals and nursing note reviewed.  Constitutional:      General: She is not in acute distress.    Appearance: She is well-developed. She is not ill-appearing or diaphoretic.  HENT:     Head: Normocephalic and atraumatic.     Right Ear: Tympanic  membrane and external ear normal.     Left Ear: External ear normal.     Ears:     Comments: Right ear canal with small abrasions/dried blood. TM intact. Left ear canal with similar dried blood. No clear TM, unclear if large perforation or other acute abnormality.    Nose: Nose normal.  Eyes:     General:        Right eye: No discharge.        Left eye: No discharge.  Cardiovascular:     Rate and Rhythm: Normal rate and regular rhythm.     Heart sounds: Normal  heart sounds.  Pulmonary:     Effort: Pulmonary effort is normal.     Breath sounds: Normal breath sounds.  Abdominal:     Palpations: Abdomen is soft.     Tenderness: There is no abdominal tenderness.  Musculoskeletal:     Left elbow: No swelling. Normal range of motion. No tenderness.       Arms:     Cervical back: Tenderness (posterior) present. No rigidity.  Skin:    General: Skin is warm and dry.  Neurological:     Mental Status: She is alert and oriented to person, place, and time.     Comments: CN 3-12 grossly intact. 5/5 strength in all 4 extremities. Grossly normal sensation. Normal finger to nose.   Psychiatric:        Mood and Affect: Mood is not anxious.     ED Results / Procedures / Treatments   Labs (all labs ordered are listed, but only abnormal results are displayed) Labs Reviewed  URINALYSIS, ROUTINE W REFLEX MICROSCOPIC - Abnormal; Notable for the following components:      Result Value   Hgb urine dipstick SMALL (*)    All other components within normal limits  COMPREHENSIVE METABOLIC PANEL - Abnormal; Notable for the following components:   Potassium 3.2 (*)    Calcium 8.7 (*)    Total Protein 6.2 (*)    GFR calc non Af Amer 57 (*)    All other components within normal limits  CBC WITH DIFFERENTIAL/PLATELET - Abnormal; Notable for the following components:   RBC 3.05 (*)    Hemoglobin 10.3 (*)    HCT 31.9 (*)    MCV 104.6 (*)    All other components within normal limits  RAPID URINE DRUG  SCREEN, HOSP PERFORMED - Abnormal; Notable for the following components:   Benzodiazepines POSITIVE (*)    All other components within normal limits  CBG MONITORING, ED    EKG EKG Interpretation  Date/Time:  Monday July 02 2019 14:51:01 EST Ventricular Rate:  58 PR Interval:    QRS Duration: 85 QT Interval:  470 QTC Calculation: 462 R Axis:   45 Text Interpretation: Sinus rhythm Borderline prolonged PR interval Low voltage, precordial leads ST changes anteriorly improved since Dec 2020 Confirmed by Pricilla Loveless [phone]) on 07/02/2019 3:56:30 PM   Radiology CT Head Wo Contrast  Result Date: 07/02/2019 CLINICAL DATA:  Multiple falls EXAM: CT HEAD WITHOUT CONTRAST TECHNIQUE: Contiguous axial images were obtained from the base of the skull through the vertex without intravenous contrast. COMPARISON:  05/07/2019 FINDINGS: Brain: There is no acute intracranial hemorrhage, mass-effect, or edema. Gray-white differentiation is preserved. There is no extra-axial fluid collection. Patchy hypoattenuation in the supratentorial white matter is nonspecific but may reflect stable mild chronic microvascular ischemic changes. Ventricles and sulci are within normal limits in size and configuration. Vascular: There is minor atherosclerotic calcification at the skull base. Skull: Calvarium is unremarkable. Sinuses/Orbits: No acute finding. Other: Degenerative changes at the left temporomandibular joint. Mastoid air cells are clear. IMPRESSION: No evidence of acute intracranial injury. Stable chronic findings detailed above. Electronically Signed   By: Guadlupe Spanish M.D.   On: 07/02/2019 14:40   CT Cervical Spine Wo Contrast  Result Date: 07/02/2019 CLINICAL DATA:  Multiple falls EXAM: CT CERVICAL SPINE WITHOUT CONTRAST TECHNIQUE: Multidetector CT imaging of the cervical spine was performed without intravenous contrast. Multiplanar CT image reconstructions were also generated. COMPARISON:  2011 FINDINGS:  Alignment: No significant listhesis. Skull base and  vertebrae: Vertebral body heights are maintained. No acute fracture. Soft tissues and spinal canal: No prevertebral fluid or swelling. No visible canal hematoma. Disc levels: Mild degenerative changes are present without high-grade osseous encroachment on the spinal canal. Upper chest: No apical lung mass. Other: None. IMPRESSION: No acute cervical spine fracture. Electronically Signed   By: Guadlupe Spanish M.D.   On: 07/02/2019 14:44    Procedures Procedures (including critical care time)  Medications Ordered in ED Medications  sodium chloride 0.9 % bolus 500 mL (0 mLs Intravenous Stopped 07/02/19 1624)  potassium chloride SA (KLOR-CON) CR tablet 40 mEq (40 mEq Oral Given 07/02/19 1654)    ED Course  I have reviewed the triage vital signs and the nursing notes.  Pertinent labs & imaging results that were available during my care of the patient were reviewed by me and considered in my medical decision making (see chart for details).    MDM Rules/Calculators/A&P                      Patient's falls are unclear etiology.  However these have been going on for quite some time.  Do not think emergent MRI is needed.  No focal neuro deficits.  Left ear does appear to TM abnormality, either complete perforation or it is sunken down.  I discussed with her ear nose throat, Dr. Suszanne Conners, who did not see any such abnormalities on his exam.  She is on Ciprodex, is here to continue the ear nose throat.  However her labs are unremarkable compared to baseline besides some mild hypokalemia.  We will replete this and advise her to follow-up with her PCP and outpatient neurology.  From a blood thinner perspective, I think she should hold her Eliquis for at least a couple given multiple sites of bleeding but also call her PCP to follow-up in a couple days for a shared decision making discussion for whether or not she she on it for such a small PE given how often she is  falling. Final Clinical Impression(s) / ED Diagnoses Final diagnoses:  Frequent falls  Minor head injury, initial encounter  Hypokalemia  Perforation of left tympanic membrane    Rx / DC Orders ED Discharge Orders    None       Pricilla Loveless, MD 07/02/19 1707

## 2019-07-02 NOTE — ED Triage Notes (Addendum)
Pt family reports pt has had daily falls for last several weeks, BLE swelling, and intermittent bleeding from nose and ear that is being managed by ENT. Pt was diagnosed with blood clot in lung a few weeks ago and is currently taking blood thinner. Pt alert and oriented. Bruise noted to Left upper arm.

## 2019-07-05 DIAGNOSIS — R600 Localized edema: Secondary | ICD-10-CM | POA: Diagnosis not present

## 2019-07-09 ENCOUNTER — Ambulatory Visit: Payer: PPO | Admitting: Psychiatry

## 2019-07-19 DIAGNOSIS — R296 Repeated falls: Secondary | ICD-10-CM | POA: Diagnosis not present

## 2019-07-19 DIAGNOSIS — R441 Visual hallucinations: Secondary | ICD-10-CM | POA: Diagnosis not present

## 2019-07-19 DIAGNOSIS — Z79899 Other long term (current) drug therapy: Secondary | ICD-10-CM | POA: Diagnosis not present

## 2019-07-19 DIAGNOSIS — G894 Chronic pain syndrome: Secondary | ICD-10-CM | POA: Diagnosis not present

## 2019-07-24 DIAGNOSIS — E785 Hyperlipidemia, unspecified: Secondary | ICD-10-CM | POA: Diagnosis not present

## 2019-07-24 DIAGNOSIS — R000 Tachycardia, unspecified: Secondary | ICD-10-CM | POA: Diagnosis not present

## 2019-07-24 DIAGNOSIS — E039 Hypothyroidism, unspecified: Secondary | ICD-10-CM | POA: Diagnosis not present

## 2019-07-24 DIAGNOSIS — E782 Mixed hyperlipidemia: Secondary | ICD-10-CM | POA: Diagnosis not present

## 2019-07-24 DIAGNOSIS — F331 Major depressive disorder, recurrent, moderate: Secondary | ICD-10-CM | POA: Diagnosis not present

## 2019-07-24 DIAGNOSIS — G43019 Migraine without aura, intractable, without status migrainosus: Secondary | ICD-10-CM | POA: Diagnosis not present

## 2019-07-24 DIAGNOSIS — R7301 Impaired fasting glucose: Secondary | ICD-10-CM | POA: Diagnosis not present

## 2019-07-26 ENCOUNTER — Other Ambulatory Visit: Payer: Self-pay

## 2019-07-26 ENCOUNTER — Telehealth (HOSPITAL_COMMUNITY): Payer: Self-pay | Admitting: Psychiatry

## 2019-07-26 ENCOUNTER — Ambulatory Visit: Payer: PPO | Admitting: Psychiatry

## 2019-07-26 DIAGNOSIS — W19XXXA Unspecified fall, initial encounter: Secondary | ICD-10-CM | POA: Diagnosis not present

## 2019-07-26 DIAGNOSIS — R269 Unspecified abnormalities of gait and mobility: Secondary | ICD-10-CM | POA: Diagnosis not present

## 2019-07-26 NOTE — Telephone Encounter (Signed)
Patient had an appointment scheduled for follow-up at 230 pm today with psychiatry service.  When I called the patient she was very confused and stated that she thought the appointment had been canceled.  She stated that she had no idea she had an appointment at 230 pm today.   She informed that she has another appointment at 330 pm and she plans to keep that appointment.  She stated that she is not sure if she is taking Librium or not as now her daughter is in charge of all her medications.  She stated that she did not need anything from psychiatry at this point.  I informed the staff at [facility] psychiatry clinic regarding this.

## 2019-08-06 ENCOUNTER — Other Ambulatory Visit: Payer: Self-pay | Admitting: *Deleted

## 2019-08-06 NOTE — Patient Outreach (Signed)
Telephone call for HTA NP Referral.  Talked with Mrs. Hua this morning. She is doing well in general. She does acknowledge she has some falls recently but she hasn't fallen recently. She reports her daughter keeps her medications now and brings them every evening. She states she doesn't know what she is taking now since her daughter is controlling her meds. This is an annoyance to her.  She has given me permission to talk with her daughter, since I could not verify the meds she is actually taking.  Note pt's speech is slurred but she is alert and able to answer all my questions. She has seen her primary care provider recently (Dr. [name] does not document in Epic). She has seen Dr. Eden Emms, cardiologist also for her palpitations, she is on propanolol.  Assessment: Many things may have contributed to her falls, most likely multiple sedating medications but also her beta blocker and being dehydrated. Many medication were discontinued and or reduced recently.  Plan: Permission given to speak with daughter. Will verify medications, ask about pt's ability to care for herself, if there was any follow through on the financial resources [facility] CSW provided in August.  Consider giving pt flexeril 5 mg to take as needed for her back spasms. She is not taking any pain medication per her report at this time. If she needs it during the day. Would have to monitor frequency and any change towards being more likely to fall.  Will coordinate with HTA social worker for plan of care.  Zara Council. Burgess Estelle, MSN, [facility] Gerontological Nurse Practitioner [facility] Care Management [phone]

## 2019-08-07 ENCOUNTER — Ambulatory Visit: Payer: Self-pay | Admitting: *Deleted

## 2019-08-08 NOTE — Progress Notes (Signed)
This visit was NOT a POSTPARTUM visit. It was a telephone call. Unsure how this choice is displayed.   Zara Council. Burgess Estelle, MSN, [facility] Gerontological Nurse Practitioner [facility] Care Management [phone]

## 2019-08-08 NOTE — Patient Outreach (Signed)
[facility]) Care Management  08/08/2019  [name] 1946-03-05 [id number]   Attempted to contact pt daughter to verify pt med list. Left a message and requested a return call.  Zara Council. Burgess Estelle, MSN, [facility] Gerontological Nurse Practitioner [facility] Care Management [phone]

## 2019-08-09 ENCOUNTER — Other Ambulatory Visit: Payer: Self-pay | Admitting: *Deleted

## 2019-08-10 NOTE — Patient Outreach (Signed)
Telephone call received from pt daughter to discuss medication management. She described the hx over the last couple of months.  Medications which were most likely to be contributing to falls were reviewed with daughter who is being very mindful of managing daily meds and not allowing her mother to be in charge of any prn medications that could be sedating.  She expresses that her mother complains daily about not having enough meds, however, she has not had a fall since her meds were evaluated by neurology and reduced.  PLEASE NOTE CURRENT MEDICATION LIST POST NEUROLOGY VISIT.  Outpatient Encounter Medications as of 08/09/2019  Medication Sig Note  . amitriptyline (ELAVIL) 50 MG tablet Take 50 mg by mouth at bedtime.   . chlordiazePOXIDE (LIBRIUM) 25 MG capsule Take 25 mg by mouth 2 (two) times daily.   . furosemide (LASIX) 40 MG tablet Hold until outpatient followup because your blood pressure was low normal. 08/10/2019: This was resumed upon discharge.  . gabapentin (NEURONTIN) 100 MG capsule Take 100 mg by mouth 3 (three) times daily.  08/09/2019: Pt is only taking this bid, to reduce possibility of oversedation.  . metoCLOPramide (REGLAN) 5 MG tablet Take 5 mg by mouth every 8 (eight) hours as needed for nausea.  08/09/2019: Only taking 5 mg in am.  . pantoprazole (PROTONIX) 40 MG tablet Take 1 tablet (40 mg total) by mouth daily before breakfast.   . propranolol (INDERAL) 40 MG tablet Take 40 mg by mouth 2 (two) times daily.  08/09/2019: This has been reduced to 20 mg bid or 1/2 a 40 mg tab bid, because of hypotension.  . simvastatin (ZOCOR) 40 MG tablet Take 40 mg by mouth at bedtime.    . meclizine (ANTIVERT) 25 MG tablet Take 25 mg by mouth 3 (three) times daily as needed for dizziness. 08/09/2019: Advised to hold as this can also cause falls.  . [DISCONTINUED] amitriptyline (ELAVIL) 75 MG tablet Take 1 tablet (75 mg total) by mouth 2 (two) times daily. 08/06/2019: Only taking one at hs.  .  [DISCONTINUED] cyclobenzaprine (FLEXERIL) 10 MG tablet Take 10 mg by mouth 3 (three) times daily as needed for muscle spasms.     No facility-administered encounter medications on file as of 08/09/2019.      Advised that NP will not be following up, however, available if problems or questions arise.  Zara Council. Burgess Estelle, MSN, [facility] Gerontological Nurse Practitioner [facility] Care Management [phone]

## 2019-08-16 ENCOUNTER — Encounter: Payer: Self-pay | Admitting: *Deleted

## 2019-08-27 DIAGNOSIS — F331 Major depressive disorder, recurrent, moderate: Secondary | ICD-10-CM | POA: Diagnosis not present

## 2019-08-27 DIAGNOSIS — E782 Mixed hyperlipidemia: Secondary | ICD-10-CM | POA: Diagnosis not present

## 2019-08-27 DIAGNOSIS — R000 Tachycardia, unspecified: Secondary | ICD-10-CM | POA: Diagnosis not present

## 2019-08-27 DIAGNOSIS — E039 Hypothyroidism, unspecified: Secondary | ICD-10-CM | POA: Diagnosis not present

## 2019-08-27 DIAGNOSIS — E785 Hyperlipidemia, unspecified: Secondary | ICD-10-CM | POA: Diagnosis not present

## 2019-08-27 DIAGNOSIS — G43019 Migraine without aura, intractable, without status migrainosus: Secondary | ICD-10-CM | POA: Diagnosis not present

## 2019-08-27 DIAGNOSIS — R7301 Impaired fasting glucose: Secondary | ICD-10-CM | POA: Diagnosis not present

## 2019-09-05 DIAGNOSIS — G8929 Other chronic pain: Secondary | ICD-10-CM | POA: Diagnosis not present

## 2019-09-05 DIAGNOSIS — E039 Hypothyroidism, unspecified: Secondary | ICD-10-CM | POA: Diagnosis not present

## 2019-09-05 DIAGNOSIS — F331 Major depressive disorder, recurrent, moderate: Secondary | ICD-10-CM | POA: Diagnosis not present

## 2019-09-05 DIAGNOSIS — G43009 Migraine without aura, not intractable, without status migrainosus: Secondary | ICD-10-CM | POA: Diagnosis not present

## 2019-09-05 DIAGNOSIS — I2699 Other pulmonary embolism without acute cor pulmonale: Secondary | ICD-10-CM | POA: Diagnosis not present

## 2019-09-05 DIAGNOSIS — E782 Mixed hyperlipidemia: Secondary | ICD-10-CM | POA: Diagnosis not present

## 2019-09-05 DIAGNOSIS — F17210 Nicotine dependence, cigarettes, uncomplicated: Secondary | ICD-10-CM | POA: Diagnosis not present

## 2019-09-05 DIAGNOSIS — G894 Chronic pain syndrome: Secondary | ICD-10-CM | POA: Diagnosis not present

## 2019-09-05 DIAGNOSIS — G43019 Migraine without aura, intractable, without status migrainosus: Secondary | ICD-10-CM | POA: Diagnosis not present

## 2019-09-05 DIAGNOSIS — F332 Major depressive disorder, recurrent severe without psychotic features: Secondary | ICD-10-CM | POA: Diagnosis not present

## 2019-09-05 DIAGNOSIS — E785 Hyperlipidemia, unspecified: Secondary | ICD-10-CM | POA: Diagnosis not present

## 2019-09-05 DIAGNOSIS — H66012 Acute suppurative otitis media with spontaneous rupture of ear drum, left ear: Secondary | ICD-10-CM | POA: Diagnosis not present

## 2019-09-18 DIAGNOSIS — F331 Major depressive disorder, recurrent, moderate: Secondary | ICD-10-CM | POA: Diagnosis not present

## 2019-09-18 DIAGNOSIS — E039 Hypothyroidism, unspecified: Secondary | ICD-10-CM | POA: Diagnosis not present

## 2019-09-18 DIAGNOSIS — R7301 Impaired fasting glucose: Secondary | ICD-10-CM | POA: Diagnosis not present

## 2019-09-18 DIAGNOSIS — E782 Mixed hyperlipidemia: Secondary | ICD-10-CM | POA: Diagnosis not present

## 2019-09-18 DIAGNOSIS — R000 Tachycardia, unspecified: Secondary | ICD-10-CM | POA: Diagnosis not present

## 2019-09-18 DIAGNOSIS — E785 Hyperlipidemia, unspecified: Secondary | ICD-10-CM | POA: Diagnosis not present

## 2019-09-18 DIAGNOSIS — G43019 Migraine without aura, intractable, without status migrainosus: Secondary | ICD-10-CM | POA: Diagnosis not present

## 2019-10-02 DIAGNOSIS — I10 Essential (primary) hypertension: Secondary | ICD-10-CM | POA: Diagnosis not present

## 2019-10-02 DIAGNOSIS — E876 Hypokalemia: Secondary | ICD-10-CM | POA: Diagnosis not present

## 2019-10-02 DIAGNOSIS — R5382 Chronic fatigue, unspecified: Secondary | ICD-10-CM | POA: Diagnosis not present

## 2019-10-02 DIAGNOSIS — R7301 Impaired fasting glucose: Secondary | ICD-10-CM | POA: Diagnosis not present

## 2019-10-02 DIAGNOSIS — R600 Localized edema: Secondary | ICD-10-CM | POA: Diagnosis not present

## 2019-10-02 DIAGNOSIS — F331 Major depressive disorder, recurrent, moderate: Secondary | ICD-10-CM | POA: Diagnosis not present

## 2019-10-02 DIAGNOSIS — K219 Gastro-esophageal reflux disease without esophagitis: Secondary | ICD-10-CM | POA: Diagnosis not present

## 2019-10-02 DIAGNOSIS — E782 Mixed hyperlipidemia: Secondary | ICD-10-CM | POA: Diagnosis not present

## 2019-10-16 DIAGNOSIS — R000 Tachycardia, unspecified: Secondary | ICD-10-CM | POA: Diagnosis not present

## 2019-10-16 DIAGNOSIS — E039 Hypothyroidism, unspecified: Secondary | ICD-10-CM | POA: Diagnosis not present

## 2019-10-16 DIAGNOSIS — F331 Major depressive disorder, recurrent, moderate: Secondary | ICD-10-CM | POA: Diagnosis not present

## 2019-10-16 DIAGNOSIS — E782 Mixed hyperlipidemia: Secondary | ICD-10-CM | POA: Diagnosis not present

## 2019-10-16 DIAGNOSIS — E785 Hyperlipidemia, unspecified: Secondary | ICD-10-CM | POA: Diagnosis not present

## 2019-10-16 DIAGNOSIS — R7301 Impaired fasting glucose: Secondary | ICD-10-CM | POA: Diagnosis not present

## 2019-10-16 DIAGNOSIS — G43019 Migraine without aura, intractable, without status migrainosus: Secondary | ICD-10-CM | POA: Diagnosis not present

## 2019-10-23 DIAGNOSIS — H66012 Acute suppurative otitis media with spontaneous rupture of ear drum, left ear: Secondary | ICD-10-CM | POA: Diagnosis not present

## 2019-10-23 DIAGNOSIS — R600 Localized edema: Secondary | ICD-10-CM | POA: Diagnosis not present

## 2019-10-23 DIAGNOSIS — F331 Major depressive disorder, recurrent, moderate: Secondary | ICD-10-CM | POA: Diagnosis not present

## 2019-10-23 DIAGNOSIS — K219 Gastro-esophageal reflux disease without esophagitis: Secondary | ICD-10-CM | POA: Diagnosis not present

## 2019-10-23 DIAGNOSIS — I10 Essential (primary) hypertension: Secondary | ICD-10-CM | POA: Diagnosis not present

## 2019-10-23 DIAGNOSIS — F332 Major depressive disorder, recurrent severe without psychotic features: Secondary | ICD-10-CM | POA: Diagnosis not present

## 2019-10-23 DIAGNOSIS — G8929 Other chronic pain: Secondary | ICD-10-CM | POA: Diagnosis not present

## 2019-10-23 DIAGNOSIS — G894 Chronic pain syndrome: Secondary | ICD-10-CM | POA: Diagnosis not present

## 2019-10-23 DIAGNOSIS — R7301 Impaired fasting glucose: Secondary | ICD-10-CM | POA: Diagnosis not present

## 2019-10-23 DIAGNOSIS — E785 Hyperlipidemia, unspecified: Secondary | ICD-10-CM | POA: Diagnosis not present

## 2019-10-23 DIAGNOSIS — R5382 Chronic fatigue, unspecified: Secondary | ICD-10-CM | POA: Diagnosis not present

## 2019-10-23 DIAGNOSIS — E876 Hypokalemia: Secondary | ICD-10-CM | POA: Diagnosis not present

## 2019-10-23 DIAGNOSIS — E039 Hypothyroidism, unspecified: Secondary | ICD-10-CM | POA: Diagnosis not present

## 2019-10-23 DIAGNOSIS — G43019 Migraine without aura, intractable, without status migrainosus: Secondary | ICD-10-CM | POA: Diagnosis not present

## 2019-10-23 DIAGNOSIS — G43009 Migraine without aura, not intractable, without status migrainosus: Secondary | ICD-10-CM | POA: Diagnosis not present

## 2019-10-23 DIAGNOSIS — F17210 Nicotine dependence, cigarettes, uncomplicated: Secondary | ICD-10-CM | POA: Diagnosis not present

## 2019-10-23 DIAGNOSIS — E782 Mixed hyperlipidemia: Secondary | ICD-10-CM | POA: Diagnosis not present

## 2019-12-11 DIAGNOSIS — E039 Hypothyroidism, unspecified: Secondary | ICD-10-CM | POA: Diagnosis not present

## 2019-12-11 DIAGNOSIS — E785 Hyperlipidemia, unspecified: Secondary | ICD-10-CM | POA: Diagnosis not present

## 2019-12-11 DIAGNOSIS — F331 Major depressive disorder, recurrent, moderate: Secondary | ICD-10-CM | POA: Diagnosis not present

## 2019-12-26 DIAGNOSIS — E039 Hypothyroidism, unspecified: Secondary | ICD-10-CM | POA: Diagnosis not present

## 2019-12-26 DIAGNOSIS — E785 Hyperlipidemia, unspecified: Secondary | ICD-10-CM | POA: Diagnosis not present

## 2019-12-26 DIAGNOSIS — F331 Major depressive disorder, recurrent, moderate: Secondary | ICD-10-CM | POA: Diagnosis not present

## 2020-01-08 ENCOUNTER — Other Ambulatory Visit: Payer: Self-pay

## 2020-02-05 DIAGNOSIS — F331 Major depressive disorder, recurrent, moderate: Secondary | ICD-10-CM | POA: Diagnosis not present

## 2020-02-05 DIAGNOSIS — I129 Hypertensive chronic kidney disease with stage 1 through stage 4 chronic kidney disease, or unspecified chronic kidney disease: Secondary | ICD-10-CM | POA: Diagnosis not present

## 2020-02-05 DIAGNOSIS — E785 Hyperlipidemia, unspecified: Secondary | ICD-10-CM | POA: Diagnosis not present

## 2020-02-05 DIAGNOSIS — E039 Hypothyroidism, unspecified: Secondary | ICD-10-CM | POA: Diagnosis not present

## 2020-02-05 DIAGNOSIS — N183 Chronic kidney disease, stage 3 unspecified: Secondary | ICD-10-CM | POA: Diagnosis not present

## 2020-02-13 DIAGNOSIS — R5382 Chronic fatigue, unspecified: Secondary | ICD-10-CM | POA: Diagnosis not present

## 2020-02-13 DIAGNOSIS — M25569 Pain in unspecified knee: Secondary | ICD-10-CM | POA: Diagnosis not present

## 2020-02-13 DIAGNOSIS — Z79891 Long term (current) use of opiate analgesic: Secondary | ICD-10-CM | POA: Diagnosis not present

## 2020-02-13 DIAGNOSIS — Z0000 Encounter for general adult medical examination without abnormal findings: Secondary | ICD-10-CM | POA: Diagnosis not present

## 2020-02-13 DIAGNOSIS — E876 Hypokalemia: Secondary | ICD-10-CM | POA: Diagnosis not present

## 2020-02-13 DIAGNOSIS — I2699 Other pulmonary embolism without acute cor pulmonale: Secondary | ICD-10-CM | POA: Diagnosis not present

## 2020-02-13 DIAGNOSIS — Z9181 History of falling: Secondary | ICD-10-CM | POA: Diagnosis not present

## 2020-02-13 DIAGNOSIS — Z6822 Body mass index (BMI) 22.0-22.9, adult: Secondary | ICD-10-CM | POA: Diagnosis not present

## 2020-02-13 DIAGNOSIS — E782 Mixed hyperlipidemia: Secondary | ICD-10-CM | POA: Diagnosis not present

## 2020-02-13 DIAGNOSIS — G894 Chronic pain syndrome: Secondary | ICD-10-CM | POA: Diagnosis not present

## 2020-02-13 DIAGNOSIS — M545 Low back pain: Secondary | ICD-10-CM | POA: Diagnosis not present

## 2020-02-13 DIAGNOSIS — R000 Tachycardia, unspecified: Secondary | ICD-10-CM | POA: Diagnosis not present

## 2020-02-20 DIAGNOSIS — R5382 Chronic fatigue, unspecified: Secondary | ICD-10-CM | POA: Diagnosis not present

## 2020-02-20 DIAGNOSIS — E782 Mixed hyperlipidemia: Secondary | ICD-10-CM | POA: Diagnosis not present

## 2020-02-20 DIAGNOSIS — I10 Essential (primary) hypertension: Secondary | ICD-10-CM | POA: Diagnosis not present

## 2020-02-20 DIAGNOSIS — F331 Major depressive disorder, recurrent, moderate: Secondary | ICD-10-CM | POA: Diagnosis not present

## 2020-02-20 DIAGNOSIS — R7301 Impaired fasting glucose: Secondary | ICD-10-CM | POA: Diagnosis not present

## 2020-02-20 DIAGNOSIS — R7303 Prediabetes: Secondary | ICD-10-CM | POA: Diagnosis not present

## 2020-02-20 DIAGNOSIS — R1310 Dysphagia, unspecified: Secondary | ICD-10-CM | POA: Diagnosis not present

## 2020-02-20 DIAGNOSIS — E559 Vitamin D deficiency, unspecified: Secondary | ICD-10-CM | POA: Diagnosis not present

## 2020-02-20 DIAGNOSIS — K219 Gastro-esophageal reflux disease without esophagitis: Secondary | ICD-10-CM | POA: Diagnosis not present

## 2020-02-20 DIAGNOSIS — N1832 Chronic kidney disease, stage 3b: Secondary | ICD-10-CM | POA: Diagnosis not present

## 2020-02-20 DIAGNOSIS — E876 Hypokalemia: Secondary | ICD-10-CM | POA: Diagnosis not present

## 2020-02-20 DIAGNOSIS — R600 Localized edema: Secondary | ICD-10-CM | POA: Diagnosis not present

## 2020-02-21 ENCOUNTER — Ambulatory Visit (INDEPENDENT_AMBULATORY_CARE_PROVIDER_SITE_OTHER): Payer: PPO | Admitting: Gastroenterology

## 2020-03-13 DIAGNOSIS — E782 Mixed hyperlipidemia: Secondary | ICD-10-CM | POA: Diagnosis not present

## 2020-03-13 DIAGNOSIS — R600 Localized edema: Secondary | ICD-10-CM | POA: Diagnosis not present

## 2020-03-13 DIAGNOSIS — I10 Essential (primary) hypertension: Secondary | ICD-10-CM | POA: Diagnosis not present

## 2020-03-13 DIAGNOSIS — R7301 Impaired fasting glucose: Secondary | ICD-10-CM | POA: Diagnosis not present

## 2020-03-13 DIAGNOSIS — F331 Major depressive disorder, recurrent, moderate: Secondary | ICD-10-CM | POA: Diagnosis not present

## 2020-03-13 DIAGNOSIS — E876 Hypokalemia: Secondary | ICD-10-CM | POA: Diagnosis not present

## 2020-03-13 DIAGNOSIS — K219 Gastro-esophageal reflux disease without esophagitis: Secondary | ICD-10-CM | POA: Diagnosis not present

## 2020-03-13 DIAGNOSIS — R5382 Chronic fatigue, unspecified: Secondary | ICD-10-CM | POA: Diagnosis not present

## 2020-03-25 DIAGNOSIS — F331 Major depressive disorder, recurrent, moderate: Secondary | ICD-10-CM | POA: Diagnosis not present

## 2020-03-25 DIAGNOSIS — E039 Hypothyroidism, unspecified: Secondary | ICD-10-CM | POA: Diagnosis not present

## 2020-03-25 DIAGNOSIS — E782 Mixed hyperlipidemia: Secondary | ICD-10-CM | POA: Diagnosis not present

## 2020-03-25 DIAGNOSIS — G43019 Migraine without aura, intractable, without status migrainosus: Secondary | ICD-10-CM | POA: Diagnosis not present

## 2020-03-25 DIAGNOSIS — R7301 Impaired fasting glucose: Secondary | ICD-10-CM | POA: Diagnosis not present

## 2020-03-25 DIAGNOSIS — R000 Tachycardia, unspecified: Secondary | ICD-10-CM | POA: Diagnosis not present

## 2020-03-29 DIAGNOSIS — F419 Anxiety disorder, unspecified: Secondary | ICD-10-CM | POA: Diagnosis not present

## 2020-04-07 DIAGNOSIS — F419 Anxiety disorder, unspecified: Secondary | ICD-10-CM | POA: Diagnosis not present

## 2020-04-07 DIAGNOSIS — K0889 Other specified disorders of teeth and supporting structures: Secondary | ICD-10-CM | POA: Diagnosis not present

## 2020-04-07 DIAGNOSIS — Z136 Encounter for screening for cardiovascular disorders: Secondary | ICD-10-CM | POA: Diagnosis not present

## 2020-05-01 DIAGNOSIS — E039 Hypothyroidism, unspecified: Secondary | ICD-10-CM | POA: Diagnosis not present

## 2020-05-01 DIAGNOSIS — N183 Chronic kidney disease, stage 3 unspecified: Secondary | ICD-10-CM | POA: Diagnosis not present

## 2020-05-01 DIAGNOSIS — I129 Hypertensive chronic kidney disease with stage 1 through stage 4 chronic kidney disease, or unspecified chronic kidney disease: Secondary | ICD-10-CM | POA: Diagnosis not present

## 2020-06-23 ENCOUNTER — Encounter: Payer: Self-pay | Admitting: Nurse Practitioner

## 2020-06-23 ENCOUNTER — Other Ambulatory Visit: Payer: Self-pay

## 2020-06-23 ENCOUNTER — Ambulatory Visit (INDEPENDENT_AMBULATORY_CARE_PROVIDER_SITE_OTHER): Payer: Medicare Other | Admitting: Nurse Practitioner

## 2020-06-23 VITALS — BP 114/64 | HR 68 | Temp 98.9°F | Resp 18 | Ht 63.0 in | Wt 142.0 lb

## 2020-06-23 DIAGNOSIS — G8929 Other chronic pain: Secondary | ICD-10-CM | POA: Diagnosis not present

## 2020-06-23 DIAGNOSIS — Z139 Encounter for screening, unspecified: Secondary | ICD-10-CM | POA: Diagnosis not present

## 2020-06-23 DIAGNOSIS — F419 Anxiety disorder, unspecified: Secondary | ICD-10-CM

## 2020-06-23 DIAGNOSIS — M549 Dorsalgia, unspecified: Secondary | ICD-10-CM | POA: Diagnosis not present

## 2020-06-23 DIAGNOSIS — Z7689 Persons encountering health services in other specified circumstances: Secondary | ICD-10-CM | POA: Diagnosis not present
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
## 2020-06-23 NOTE — Assessment & Plan Note (Signed)
-  saw psychiatry in the past, but does not want to go back to psych -she was taking librium previously, but this was swapped to valium when librium was not available -she states she is still very anxious -she refused psych again today -will get records from Dr. [name]
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
## 2020-06-23 NOTE — Patient Instructions (Signed)
We will meet back in 1 week for lab follow-up. I will request records from Dr. Scharlene Gloss. Please fill out a release of information before leaving today.  The labs will need to be performed after fasting, so no food for 8 hours prior to labs.  If you have fasted today, they can be completed today.

## 2020-06-30 ENCOUNTER — Ambulatory Visit: Payer: Self-pay | Admitting: Nurse Practitioner

## 2020-07-08 ENCOUNTER — Ambulatory Visit: Payer: Self-pay | Admitting: Nurse Practitioner

## 2020-07-14 DIAGNOSIS — Z7689 Persons encountering health services in other specified circumstances: Secondary | ICD-10-CM | POA: Diagnosis not present

## 2020-07-14 DIAGNOSIS — N183 Chronic kidney disease, stage 3 unspecified: Secondary | ICD-10-CM | POA: Diagnosis not present

## 2020-07-14 DIAGNOSIS — E785 Hyperlipidemia, unspecified: Secondary | ICD-10-CM | POA: Diagnosis not present

## 2020-07-14 DIAGNOSIS — Z139 Encounter for screening, unspecified: Secondary | ICD-10-CM | POA: Diagnosis not present

## 2020-07-15 LAB — LIPID PANEL WITH LDL/HDL RATIO
Cholesterol, Total: 193 mg/dL (ref 100–199)
HDL: 68 mg/dL (ref >39)
LDL Chol Calc (NIH): 96 mg/dL (ref 0–99)
LDL/HDL Ratio: 1.4 ratio (ref 0.0–3.2)
Triglycerides: 171 mg/dL — ABNORMAL HIGH (ref 0–149)
VLDL Cholesterol Cal: 29 mg/dL (ref 5–40)

## 2020-07-15 LAB — CBC WITH DIFFERENTIAL/PLATELET
Basophils Absolute: 0.1 10*3/uL (ref 0.0–0.2)
Basos: 1 %
EOS (ABSOLUTE): 0.3 10*3/uL (ref 0.0–0.4)
Eos: 3 %
Hematocrit: 38.6 % (ref 34.0–46.6)
Hemoglobin: 12.9 g/dL (ref 11.1–15.9)
Immature Grans (Abs): 0 10*3/uL (ref 0.0–0.1)
Immature Granulocytes: 0 %
Lymphocytes Absolute: 2.6 10*3/uL (ref 0.7–3.1)
Lymphs: 27 %
MCH: 30.3 pg (ref 26.6–33.0)
MCHC: 33.4 g/dL (ref 31.5–35.7)
MCV: 91 fL (ref 79–97)
Monocytes Absolute: 0.8 10*3/uL (ref 0.1–0.9)
Monocytes: 9 %
Neutrophils Absolute: 5.8 10*3/uL (ref 1.4–7.0)
Neutrophils: 60 %
Platelets: 283 10*3/uL (ref 150–450)
RBC: 4.26 x10E6/uL (ref 3.77–5.28)
RDW: 13.9 % (ref 11.7–15.4)
WBC: 9.6 10*3/uL (ref 3.4–10.8)

## 2020-07-15 LAB — CMP14+EGFR
ALT: 20 IU/L (ref 0–32)
AST: 25 IU/L (ref 0–40)
Albumin/Globulin Ratio: 1.7 (ref 1.2–2.2)
Albumin: 4.6 g/dL (ref 3.7–4.7)
Alkaline Phosphatase: 104 IU/L (ref 44–121)
BUN/Creatinine Ratio: 11 — ABNORMAL LOW (ref 12–28)
BUN: 14 mg/dL (ref 8–27)
Bilirubin Total: <0.2 mg/dL (ref 0.0–1.2)
CO2: 24 mmol/L (ref 20–29)
Calcium: 9.5 mg/dL (ref 8.7–10.3)
Chloride: 101 mmol/L (ref 96–106)
Creatinine, Ser: 1.25 mg/dL — ABNORMAL HIGH (ref 0.57–1.00)
GFR calc Af Amer: 49 mL/min/{1.73_m2} — ABNORMAL LOW (ref >59)
GFR calc non Af Amer: 42 mL/min/{1.73_m2} — ABNORMAL LOW (ref >59)
Globulin, Total: 2.7 g/dL (ref 1.5–4.5)
Glucose: 117 mg/dL — ABNORMAL HIGH (ref 65–99)
Potassium: 4 mmol/L (ref 3.5–5.2)
Sodium: 143 mmol/L (ref 134–144)
Total Protein: 7.3 g/dL (ref 6.0–8.5)

## 2020-07-15 LAB — HCV INTERPRETATION

## 2020-07-15 LAB — HCV AB W/RFLX TO VERIFICATION: HCV Ab: <0.1 s/co ratio (ref 0.0–0.9)

## 2020-07-15 NOTE — Progress Notes (Signed)
Her kidney function labs were running a little on the low side. She may be able to improve these numbers by increasing her hydration (drinking more water).  We can discuss this further at her appt. at 1 pm tomorrow.

## 2020-07-16 ENCOUNTER — Ambulatory Visit (INDEPENDENT_AMBULATORY_CARE_PROVIDER_SITE_OTHER): Payer: Self-pay | Admitting: Nurse Practitioner

## 2020-07-16 ENCOUNTER — Encounter: Payer: Self-pay | Admitting: Nurse Practitioner

## 2020-07-16 ENCOUNTER — Other Ambulatory Visit: Payer: Self-pay

## 2020-07-16 DIAGNOSIS — E781 Pure hyperglyceridemia: Secondary | ICD-10-CM

## 2020-07-16 DIAGNOSIS — N1832 Chronic kidney disease, stage 3b: Secondary | ICD-10-CM

## 2020-07-16 DIAGNOSIS — E785 Hyperlipidemia, unspecified: Secondary | ICD-10-CM | POA: Insufficient documentation

## 2020-07-16 DIAGNOSIS — N183 Chronic kidney disease, stage 3 unspecified: Secondary | ICD-10-CM | POA: Insufficient documentation

## 2020-07-16 DIAGNOSIS — F419 Anxiety disorder, unspecified: Secondary | ICD-10-CM

## 2020-07-16 NOTE — Assessment & Plan Note (Addendum)
-  last filled valium on 07/02/20; was taking 5 mg TID PRN -reviewed records from Dr. Scharlene Gloss -was taking librium previously, but her pharmacy did not stock this -taking elavil 50 mg qHS -we discussed that she has tried multiple medications over the years and her psychiatric needs have surpassed the relam of primary care -refer to psych, although she may refuse this

## 2020-07-16 NOTE — Patient Instructions (Signed)
Your labs were great. We will check them again in 6 months, but we will have a med check in 3 months.

## 2020-07-16 NOTE — Assessment & Plan Note (Signed)
--  mild elevation in trigs today -we discussed cutting back on fried/fatty foods -unsure if she fasted for labs as glucose was elevated as well

## 2020-07-16 NOTE — Assessment & Plan Note (Addendum)
Lab Results  Component Value Date   NA 143 07/14/2020   K 4.0 07/14/2020   CREATININE 1.25 (H) 07/14/2020   GFRNONAA 42 (L) 07/14/2020   GFRAA 49 (L) 07/14/2020   GLUCOSE 117 (H) 07/14/2020   -discussed increasing hydration -if further renal decline, will consider nephrology consult

## 2020-07-16 NOTE — Progress Notes (Signed)
Established Patient Office Visit  Subjective:  Patient ID: [name], female    DOB: 04-Jun-1946  Age: 75 y.o. MRN: [id number]  CC:  Chief Complaint  Patient presents with  . Follow-up    To go over labs.     HPI [name] presents for lab follow-up.  She states that she is so anxious that she avoids going out in public. She states the valium is not helping.    Past Medical History:  Diagnosis Date  . Anxiety   . Back pain   . Fibromyalgia   . Hyperlipemia   . Hypothyroidism   . Neuropathy   . Sinusitis   . Stroke [facility])    no deficits    Past Surgical History:  Procedure Laterality Date  . ABDOMINAL HYSTERECTOMY    . BIOPSY  08/25/2018   Procedure: BIOPSY;  Surgeon: Malissa Hippo, MD;  [facility];  Service: Endoscopy;;  . CATARACT EXTRACTION Bilateral   . CHOLECYSTECTOMY    . ESOPHAGEAL DILATION N/A 06/27/2015   Procedure: ESOPHAGEAL DILATION;  Surgeon: Malissa Hippo, MD;  [facility];  Service: Endoscopy;  Laterality: N/A;  . ESOPHAGEAL DILATION N/A 08/25/2018   Procedure: ESOPHAGEAL DILATION;  Surgeon: Malissa Hippo, MD;  [facility];  Service: Endoscopy;  Laterality: N/A;  . ESOPHAGOGASTRODUODENOSCOPY (EGD) WITH PROPOFOL N/A 06/27/2015   Procedure: ESOPHAGOGASTRODUODENOSCOPY (EGD) WITH PROPOFOL;  Surgeon: Malissa Hippo, MD;  [facility];  Service: Endoscopy;  Laterality: N/A;  . ESOPHAGOGASTRODUODENOSCOPY (EGD) WITH PROPOFOL N/A 08/25/2018   Procedure: ESOPHAGOGASTRODUODENOSCOPY (EGD) WITH PROPOFOL;  Surgeon: Malissa Hippo, MD;  [facility];  Service: Endoscopy;  Laterality: N/A;  9:30    Family History  Problem Relation Age of Onset  . Depression Mother   . Depression Father   . Depression Sister   . Anxiety disorder Sister   . Anxiety disorder Sister   . Alcohol abuse Neg Hx   . Drug abuse Neg Hx     Social History   Socioeconomic History  . Marital status: Married     Spouse name: Not on file  . Number of children: Not on file  . Years of education: Not on file  . Highest education level: Not on file  Occupational History  . Not on file  Tobacco Use  . Smoking status: Former Smoker    Packs/day: 0.25    Years: 50.00    Pack years: 12.50    Types: Cigarettes    Start date: 06/02/2020  . Smokeless tobacco: Never Used  . Tobacco comment: smokes 1/2 pack every 2 day.  Substance and Sexual Activity  . Alcohol use: No    Alcohol/week: 0.0 standard drinks  . Drug use: No  . Sexual activity: Never    Birth control/protection: Surgical  Other Topics Concern  . Not on file  Social History Narrative  . Not on file   Social Determinants of Health   Financial Resource Strain: Not on file  Food Insecurity: Not on file  Transportation Needs: Not on file  Physical Activity: Not on file  Stress: Not on file  Social Connections: Not on file  Intimate Partner Violence: Not on file    Outpatient Medications Prior to Visit  Medication Sig Dispense Refill  . amitriptyline (ELAVIL) 50 MG tablet Take 50 mg by mouth at bedtime.    . furosemide (LASIX) 40 MG tablet Hold until outpatient followup because your blood  pressure was low normal. 30 tablet   . gabapentin (NEURONTIN) 100 MG capsule Take 100 mg by mouth 3 (three) times daily.     . meclizine (ANTIVERT) 25 MG tablet Take 25 mg by mouth 3 (three) times daily as needed for dizziness.    . metoCLOPramide (REGLAN) 5 MG tablet Take 5 mg by mouth every 8 (eight) hours as needed for nausea.     . pantoprazole (PROTONIX) 40 MG tablet Take 1 tablet (40 mg total) by mouth daily before breakfast. 30 tablet 5  . propranolol (INDERAL) 40 MG tablet Take 40 mg by mouth 2 (two) times daily.     . simvastatin (ZOCOR) 40 MG tablet Take 40 mg by mouth at bedtime.   4  . chlordiazePOXIDE (LIBRIUM) 25 MG capsule Take 25 mg by mouth 2 (two) times daily.     No facility-administered medications prior to visit.     Allergies  Allergen Reactions  . Codeine Nausea Only  . Prednisone Palpitations and Hypertension    ROS Review of Systems  Constitutional: Negative.   Respiratory: Negative.   Cardiovascular: Negative.   Musculoskeletal: Negative.   Neurological: Negative.   Psychiatric/Behavioral: Negative for self-injury and suicidal ideas. The patient is nervous/anxious.       Objective:    Physical Exam Constitutional:      Appearance: Normal appearance.  Cardiovascular:     Rate and Rhythm: Normal rate and regular rhythm.     Pulses: Normal pulses.     Heart sounds: Normal heart sounds.  Pulmonary:     Effort: Pulmonary effort is normal.     Breath sounds: Normal breath sounds.  Musculoskeletal:        General: Normal range of motion.  Neurological:     General: No focal deficit present.     Mental Status: She is alert and oriented to person, place, and time. Mental status is at baseline.  Psychiatric:        Mood and Affect: Mood normal.        Behavior: Behavior normal.     Comments: She is concerned about her anxiety, and states she has tried every medication except xanax     BP 113/69   Pulse 73   Temp 99 F (37.2 C)   Resp 20   Ht 5\' 3"  (1.6 m)   Wt 138 lb (62.6 kg)   SpO2 92%   BMI 24.45 kg/m  Wt Readings from Last 3 Encounters:  07/16/20 138 lb (62.6 kg)  06/23/20 142 lb (64.4 kg)  07/02/19 122 lb (55.3 kg)     Health Maintenance Due  Topic Date Due  . COVID-19 Vaccine (1) Never done  . TETANUS/TDAP  Never done  . COLONOSCOPY (Pts 45-25yrs Insurance coverage will need to be confirmed)  Never done  . MAMMOGRAM  Never done  . DEXA SCAN  Never done  . INFLUENZA VACCINE  01/13/2020  . PNA vac Low Risk Adult (2 of 2 - PCV13) 05/28/2020    There are no preventive care reminders to display for this patient.  Lab Results  Component Value Date   TSH 7.027 (H) 03/22/2016   Lab Results  Component Value Date   WBC 9.6 07/14/2020   HGB 12.9 07/14/2020    HCT 38.6 07/14/2020   MCV 91 07/14/2020   PLT 283 07/14/2020   Lab Results  Component Value Date   NA 143 07/14/2020   K 4.0 07/14/2020   CO2 24 07/14/2020   GLUCOSE  117 (H) 07/14/2020   BUN 14 07/14/2020   CREATININE 1.25 (H) 07/14/2020   BILITOT <0.2 07/14/2020   ALKPHOS 104 07/14/2020   AST 25 07/14/2020   ALT 20 07/14/2020   PROT 7.3 07/14/2020   ALBUMIN 4.6 07/14/2020   CALCIUM 9.5 07/14/2020   ANIONGAP 7 07/02/2019   Lab Results  Component Value Date   CHOL 193 07/14/2020   Lab Results  Component Value Date   HDL 68 07/14/2020   Lab Results  Component Value Date   LDLCALC 96 07/14/2020   Lab Results  Component Value Date   TRIG 171 (H) 07/14/2020   No results found for: CHOLHDL Lab Results  Component Value Date   HGBA1C 5.7 (H) 07/13/2011      Assessment & Plan:   Problem List Items Addressed This Visit      Genitourinary   Chronic kidney disease, stage III (moderate) (HCC)    Lab Results  Component Value Date   NA 143 07/14/2020   K 4.0 07/14/2020   CREATININE 1.25 (H) 07/14/2020   GFRNONAA 42 (L) 07/14/2020   GFRAA 49 (L) 07/14/2020   GLUCOSE 117 (H) 07/14/2020   -discussed increasing hydration -if further renal decline, will consider nephrology consult         Other   Anxiety    -last filled valium on 07/02/20; was taking 5 mg TID PRN -reviewed records from Dr. Scharlene Gloss -was taking librium previously, but her pharmacy did not stock this -taking elavil 50 mg qHS -we discussed that she has tried multiple medications over the years and her psychiatric needs have surpassed the relam of primary care -refer to psych, although she may refuse this      Relevant Orders   Ambulatory referral to Psychiatry   Hyperlipidemia    --mild elevation in trigs today -we discussed cutting back on fried/fatty foods -unsure if she fasted for labs as glucose was elevated as well         No orders of the defined types were placed in this  encounter.   Follow-up: Return in about 3 months (around 10/13/2020) for Med check.    Heather Roberts, NP

## 2020-09-01 ENCOUNTER — Other Ambulatory Visit: Payer: Self-pay | Admitting: Nurse Practitioner

## 2020-09-01 ENCOUNTER — Other Ambulatory Visit: Payer: Self-pay

## 2020-09-01 ENCOUNTER — Telehealth: Payer: Self-pay

## 2020-09-01 MED ORDER — PROPRANOLOL HCL 40 MG PO TABS: 40.0000 mg | ORAL_TABLET | Freq: Two times a day (BID) | ORAL | 2 refills | Status: DC

## 2020-09-01 MED ORDER — DIAZEPAM 5 MG PO TABS: 5.0000 mg | ORAL_TABLET | Freq: Three times a day (TID) | ORAL | 2 refills | Status: DC | PRN

## 2020-09-01 NOTE — Telephone Encounter (Signed)
[facility] is the correct pharmacy   Per the Daughter they sent this AM   The original RX went to Dr Marcelo Baldy office --

## 2020-09-01 NOTE — Telephone Encounter (Signed)
Sent in the valium. What is the dosage on her vit D? It may be an OTC

## 2020-09-01 NOTE — Telephone Encounter (Signed)
2000 IU is an OTC, so no Rx required

## 2020-09-01 NOTE — Telephone Encounter (Signed)
Pt daughter informed

## 2020-09-01 NOTE — Telephone Encounter (Signed)
Vitamin d 2,000 IU daily

## 2020-09-01 NOTE — Telephone Encounter (Signed)
Pt daughter states that she is needing a new rx for her Diazepam and her Vitamin D and propranolol sen tot [facility]. I sent the Propranolol because that was the only one I seen on the medication list. Daughter states the other 2 were initially rx'd by Toni Amend at Dr. Scharlene Gloss office and she needs new ones under your name.

## 2020-09-24 ENCOUNTER — Other Ambulatory Visit: Payer: Self-pay

## 2020-09-24 DIAGNOSIS — F419 Anxiety disorder, unspecified: Secondary | ICD-10-CM

## 2020-09-24 DIAGNOSIS — K219 Gastro-esophageal reflux disease without esophagitis: Secondary | ICD-10-CM

## 2020-09-24 MED ORDER — PANTOPRAZOLE SODIUM 40 MG PO TBEC: 40.0000 mg | DELAYED_RELEASE_TABLET | Freq: Every day | ORAL | 5 refills | Status: DC

## 2020-09-24 MED ORDER — AMITRIPTYLINE HCL 50 MG PO TABS: 50.0000 mg | ORAL_TABLET | Freq: Every day | ORAL | 2 refills | Status: DC

## 2020-10-13 ENCOUNTER — Other Ambulatory Visit: Payer: Self-pay

## 2020-10-13 DIAGNOSIS — E781 Pure hyperglyceridemia: Secondary | ICD-10-CM

## 2020-10-13 DIAGNOSIS — I2699 Other pulmonary embolism without acute cor pulmonale: Secondary | ICD-10-CM

## 2020-10-13 MED ORDER — PROPRANOLOL HCL 40 MG PO TABS: 40.0000 mg | ORAL_TABLET | Freq: Two times a day (BID) | ORAL | 2 refills | Status: DC

## 2020-10-13 MED ORDER — SIMVASTATIN 40 MG PO TABS: 40.0000 mg | ORAL_TABLET | Freq: Every day | ORAL | 4 refills | Status: DC

## 2020-10-15 ENCOUNTER — Ambulatory Visit: Payer: Self-pay | Admitting: Nurse Practitioner

## 2020-10-15 ENCOUNTER — Encounter: Payer: Self-pay | Admitting: Nurse Practitioner

## 2020-10-21 ENCOUNTER — Ambulatory Visit: Payer: PPO | Admitting: Nurse Practitioner

## 2020-10-29 ENCOUNTER — Other Ambulatory Visit: Payer: Self-pay

## 2020-10-29 DIAGNOSIS — F419 Anxiety disorder, unspecified: Secondary | ICD-10-CM

## 2020-10-29 DIAGNOSIS — I2699 Other pulmonary embolism without acute cor pulmonale: Secondary | ICD-10-CM

## 2020-10-29 MED ORDER — AMITRIPTYLINE HCL 50 MG PO TABS: 50.0000 mg | ORAL_TABLET | Freq: Every day | ORAL | 2 refills | Status: DC

## 2020-10-29 MED ORDER — PROPRANOLOL HCL 40 MG PO TABS: 40.0000 mg | ORAL_TABLET | Freq: Two times a day (BID) | ORAL | 2 refills | Status: DC

## 2020-11-05 ENCOUNTER — Telehealth: Payer: Self-pay

## 2020-11-05 NOTE — Telephone Encounter (Signed)
Hailey from [facility] [place] left voicemail asking refill sent in for propranolol 40mg  and patient thinks it suppose to take 20 mg. Call back # [phone]

## 2020-11-05 NOTE — Telephone Encounter (Signed)
The record shows 40 mg BID

## 2020-11-06 NOTE — Telephone Encounter (Signed)
Left message for [facility]

## 2020-11-12 NOTE — Telephone Encounter (Signed)
I spoke with Levert Feinstein at [facility] and she documented correct dosage, states she will let Hailey know and they will contact pt.

## 2020-12-29 DIAGNOSIS — M1990 Unspecified osteoarthritis, unspecified site: Secondary | ICD-10-CM | POA: Diagnosis not present

## 2020-12-29 DIAGNOSIS — K589 Irritable bowel syndrome without diarrhea: Secondary | ICD-10-CM | POA: Diagnosis not present

## 2020-12-29 DIAGNOSIS — M797 Fibromyalgia: Secondary | ICD-10-CM | POA: Diagnosis not present

## 2020-12-29 DIAGNOSIS — G629 Polyneuropathy, unspecified: Secondary | ICD-10-CM | POA: Diagnosis not present

## 2021-01-05 ENCOUNTER — Other Ambulatory Visit: Payer: Self-pay

## 2021-01-05 ENCOUNTER — Other Ambulatory Visit (HOSPITAL_COMMUNITY)
Admission: RE | Admit: 2021-01-05 | Discharge: 2021-01-05 | Disposition: A | Discharge status: Home / Self Care | Payer: PPO | Source: Ambulatory Visit | Attending: Internal Medicine | Admitting: Internal Medicine

## 2021-01-05 DIAGNOSIS — G629 Polyneuropathy, unspecified: Secondary | ICD-10-CM | POA: Insufficient documentation

## 2021-01-05 DIAGNOSIS — F419 Anxiety disorder, unspecified: Secondary | ICD-10-CM | POA: Insufficient documentation

## 2021-01-05 DIAGNOSIS — I10 Essential (primary) hypertension: Secondary | ICD-10-CM | POA: Insufficient documentation

## 2021-01-05 DIAGNOSIS — E559 Vitamin D deficiency, unspecified: Secondary | ICD-10-CM | POA: Diagnosis not present

## 2021-01-05 DIAGNOSIS — G894 Chronic pain syndrome: Secondary | ICD-10-CM | POA: Insufficient documentation

## 2021-01-05 DIAGNOSIS — M797 Fibromyalgia: Secondary | ICD-10-CM | POA: Insufficient documentation

## 2021-01-05 LAB — CBC WITH DIFFERENTIAL/PLATELET
Abs Immature Granulocytes: 0.03 10*3/uL (ref 0.00–0.07)
Basophils Absolute: 0.1 10*3/uL (ref 0.0–0.1)
Basophils Relative: 1 %
Eosinophils Absolute: 0.2 10*3/uL (ref 0.0–0.5)
Eosinophils Relative: 2 %
HCT: 41.7 % (ref 36.0–46.0)
Hemoglobin: 13.6 g/dL (ref 12.0–15.0)
Immature Granulocytes: 0 %
Lymphocytes Relative: 22 %
Lymphs Abs: 2 10*3/uL (ref 0.7–4.0)
MCH: 31.4 pg (ref 26.0–34.0)
MCHC: 32.6 g/dL (ref 30.0–36.0)
MCV: 96.3 fL (ref 80.0–100.0)
Monocytes Absolute: 0.6 10*3/uL (ref 0.1–1.0)
Monocytes Relative: 7 %
Neutro Abs: 6.1 10*3/uL (ref 1.7–7.7)
Neutrophils Relative %: 68 %
Platelets: 255 10*3/uL (ref 150–400)
RBC: 4.33 MIL/uL (ref 3.87–5.11)
RDW: 14.5 % (ref 11.5–15.5)
WBC: 9.1 10*3/uL (ref 4.0–10.5)
nRBC: 0 % (ref 0.0–0.2)

## 2021-01-05 LAB — HEPATIC FUNCTION PANEL
ALT: 18 U/L (ref 0–44)
AST: 19 U/L (ref 15–41)
Albumin: 3.7 g/dL (ref 3.5–5.0)
Alkaline Phosphatase: 71 U/L (ref 38–126)
Bilirubin, Direct: <0.1 mg/dL (ref 0.0–0.2)
Total Bilirubin: 0.6 mg/dL (ref 0.3–1.2)
Total Protein: 7.1 g/dL (ref 6.5–8.1)

## 2021-01-05 LAB — LIPID PANEL
Cholesterol: 167 mg/dL (ref 0–200)
HDL: 52 mg/dL (ref >40)
LDL Cholesterol: 78 mg/dL (ref 0–99)
Total CHOL/HDL Ratio: 3.2 RATIO
Triglycerides: 184 mg/dL — ABNORMAL HIGH (ref <150)
VLDL: 37 mg/dL (ref 0–40)

## 2021-01-05 LAB — BASIC METABOLIC PANEL
Anion gap: 7 (ref 5–15)
BUN: 10 mg/dL (ref 8–23)
CO2: 30 mmol/L (ref 22–32)
Calcium: 8.8 mg/dL — ABNORMAL LOW (ref 8.9–10.3)
Chloride: 103 mmol/L (ref 98–111)
Creatinine, Ser: 1.1 mg/dL — ABNORMAL HIGH (ref 0.44–1.00)
GFR, Estimated: 52 mL/min — ABNORMAL LOW (ref >60)
Glucose, Bld: 118 mg/dL — ABNORMAL HIGH (ref 70–99)
Potassium: 3.7 mmol/L (ref 3.5–5.1)
Sodium: 140 mmol/L (ref 135–145)

## 2021-01-05 LAB — VITAMIN D 25 HYDROXY (VIT D DEFICIENCY, FRACTURES): Vit D, 25-Hydroxy: 48.64 ng/mL (ref 30–100)

## 2021-01-12 DIAGNOSIS — G629 Polyneuropathy, unspecified: Secondary | ICD-10-CM | POA: Diagnosis not present

## 2021-01-12 DIAGNOSIS — Z1389 Encounter for screening for other disorder: Secondary | ICD-10-CM | POA: Diagnosis not present

## 2021-01-12 DIAGNOSIS — Z1331 Encounter for screening for depression: Secondary | ICD-10-CM | POA: Diagnosis not present

## 2021-01-12 DIAGNOSIS — K219 Gastro-esophageal reflux disease without esophagitis: Secondary | ICD-10-CM | POA: Diagnosis not present

## 2021-01-12 DIAGNOSIS — I10 Essential (primary) hypertension: Secondary | ICD-10-CM | POA: Diagnosis not present

## 2021-01-12 DIAGNOSIS — Z0001 Encounter for general adult medical examination with abnormal findings: Secondary | ICD-10-CM | POA: Diagnosis not present

## 2021-01-12 DIAGNOSIS — Z23 Encounter for immunization: Secondary | ICD-10-CM | POA: Diagnosis not present

## 2021-01-18 ENCOUNTER — Other Ambulatory Visit: Payer: Self-pay | Admitting: Nurse Practitioner

## 2021-02-04 DIAGNOSIS — F19939 Other psychoactive substance use, unspecified with withdrawal, unspecified: Secondary | ICD-10-CM | POA: Diagnosis not present

## 2021-02-04 DIAGNOSIS — I959 Hypotension, unspecified: Secondary | ICD-10-CM | POA: Diagnosis not present

## 2021-02-04 DIAGNOSIS — R0902 Hypoxemia: Secondary | ICD-10-CM | POA: Diagnosis not present

## 2021-02-04 DIAGNOSIS — R42 Dizziness and giddiness: Secondary | ICD-10-CM | POA: Diagnosis not present

## 2021-02-04 DIAGNOSIS — R001 Bradycardia, unspecified: Secondary | ICD-10-CM | POA: Diagnosis not present

## 2021-02-15 ENCOUNTER — Encounter (HOSPITAL_COMMUNITY): Payer: Self-pay

## 2021-02-15 ENCOUNTER — Encounter: Payer: Self-pay | Admitting: Emergency Medicine

## 2021-02-15 ENCOUNTER — Other Ambulatory Visit: Payer: Self-pay

## 2021-02-15 ENCOUNTER — Inpatient Hospital Stay (HOSPITAL_COMMUNITY)
Admission: EM | Admit: 2021-02-15 | Discharge: 2021-02-20 | DRG: 641 | Disposition: A | Discharge status: Home / Self Care | Payer: PPO | Attending: Internal Medicine | Admitting: Internal Medicine

## 2021-02-15 ENCOUNTER — Ambulatory Visit
Admission: EM | Admit: 2021-02-15 | Discharge: 2021-02-15 | Disposition: A | Discharge status: Home / Self Care | Payer: PPO | Source: Home / Self Care | Attending: Internal Medicine | Admitting: Internal Medicine

## 2021-02-15 DIAGNOSIS — E785 Hyperlipidemia, unspecified: Secondary | ICD-10-CM | POA: Diagnosis present

## 2021-02-15 DIAGNOSIS — M797 Fibromyalgia: Secondary | ICD-10-CM | POA: Diagnosis not present

## 2021-02-15 DIAGNOSIS — R0789 Other chest pain: Secondary | ICD-10-CM | POA: Diagnosis not present

## 2021-02-15 DIAGNOSIS — R112 Nausea with vomiting, unspecified: Secondary | ICD-10-CM

## 2021-02-15 DIAGNOSIS — R630 Anorexia: Secondary | ICD-10-CM | POA: Diagnosis not present

## 2021-02-15 DIAGNOSIS — R251 Tremor, unspecified: Secondary | ICD-10-CM | POA: Diagnosis not present

## 2021-02-15 DIAGNOSIS — F411 Generalized anxiety disorder: Secondary | ICD-10-CM | POA: Diagnosis present

## 2021-02-15 DIAGNOSIS — Z885 Allergy status to narcotic agent status: Secondary | ICD-10-CM | POA: Diagnosis not present

## 2021-02-15 DIAGNOSIS — Z888 Allergy status to other drugs, medicaments and biological substances status: Secondary | ICD-10-CM

## 2021-02-15 DIAGNOSIS — R110 Nausea: Secondary | ICD-10-CM

## 2021-02-15 DIAGNOSIS — R9431 Abnormal electrocardiogram [ECG] [EKG]: Secondary | ICD-10-CM | POA: Diagnosis not present

## 2021-02-15 DIAGNOSIS — Z79899 Other long term (current) drug therapy: Secondary | ICD-10-CM | POA: Diagnosis not present

## 2021-02-15 DIAGNOSIS — F10239 Alcohol dependence with withdrawal, unspecified: Secondary | ICD-10-CM | POA: Diagnosis not present

## 2021-02-15 DIAGNOSIS — Z87891 Personal history of nicotine dependence: Secondary | ICD-10-CM

## 2021-02-15 DIAGNOSIS — N3001 Acute cystitis with hematuria: Secondary | ICD-10-CM | POA: Diagnosis present

## 2021-02-15 DIAGNOSIS — E876 Hypokalemia: Principal | ICD-10-CM

## 2021-02-15 DIAGNOSIS — R6889 Other general symptoms and signs: Secondary | ICD-10-CM

## 2021-02-15 DIAGNOSIS — M549 Dorsalgia, unspecified: Secondary | ICD-10-CM | POA: Diagnosis not present

## 2021-02-15 DIAGNOSIS — N281 Cyst of kidney, acquired: Secondary | ICD-10-CM | POA: Diagnosis not present

## 2021-02-15 DIAGNOSIS — R079 Chest pain, unspecified: Secondary | ICD-10-CM | POA: Diagnosis not present

## 2021-02-15 DIAGNOSIS — F419 Anxiety disorder, unspecified: Secondary | ICD-10-CM | POA: Diagnosis not present

## 2021-02-15 DIAGNOSIS — G629 Polyneuropathy, unspecified: Secondary | ICD-10-CM | POA: Diagnosis not present

## 2021-02-15 DIAGNOSIS — R451 Restlessness and agitation: Secondary | ICD-10-CM | POA: Diagnosis present

## 2021-02-15 DIAGNOSIS — Z20822 Contact with and (suspected) exposure to covid-19: Secondary | ICD-10-CM | POA: Diagnosis not present

## 2021-02-15 DIAGNOSIS — R319 Hematuria, unspecified: Secondary | ICD-10-CM | POA: Diagnosis not present

## 2021-02-15 DIAGNOSIS — E039 Hypothyroidism, unspecified: Secondary | ICD-10-CM | POA: Diagnosis not present

## 2021-02-15 DIAGNOSIS — R002 Palpitations: Secondary | ICD-10-CM | POA: Diagnosis not present

## 2021-02-15 DIAGNOSIS — Z8673 Personal history of transient ischemic attack (TIA), and cerebral infarction without residual deficits: Secondary | ICD-10-CM | POA: Diagnosis not present

## 2021-02-15 DIAGNOSIS — E860 Dehydration: Secondary | ICD-10-CM | POA: Diagnosis not present

## 2021-02-15 DIAGNOSIS — R531 Weakness: Secondary | ICD-10-CM

## 2021-02-15 DIAGNOSIS — F32A Depression, unspecified: Secondary | ICD-10-CM

## 2021-02-15 DIAGNOSIS — K219 Gastro-esophageal reflux disease without esophagitis: Secondary | ICD-10-CM | POA: Diagnosis not present

## 2021-02-15 DIAGNOSIS — Z818 Family history of other mental and behavioral disorders: Secondary | ICD-10-CM

## 2021-02-15 LAB — POCT URINALYSIS DIP (MANUAL ENTRY)
Glucose, UA: NEGATIVE mg/dL
Nitrite, UA: NEGATIVE
Protein Ur, POC: 30 mg/dL — AB
Spec Grav, UA: 1.02 (ref 1.010–1.025)
Urobilinogen, UA: 2 E.U./dL — AB
pH, UA: 6.5 (ref 5.0–8.0)

## 2021-02-15 LAB — CBC WITH DIFFERENTIAL/PLATELET
Abs Immature Granulocytes: 0.04 10*3/uL (ref 0.00–0.07)
Basophils Absolute: 0.1 10*3/uL (ref 0.0–0.1)
Basophils Relative: 1 %
Eosinophils Absolute: 0 10*3/uL (ref 0.0–0.5)
Eosinophils Relative: 0 %
HCT: 38.4 % (ref 36.0–46.0)
Hemoglobin: 12.8 g/dL (ref 12.0–15.0)
Immature Granulocytes: 0 %
Lymphocytes Relative: 14 %
Lymphs Abs: 1.3 10*3/uL (ref 0.7–4.0)
MCH: 31.1 pg (ref 26.0–34.0)
MCHC: 33.3 g/dL (ref 30.0–36.0)
MCV: 93.4 fL (ref 80.0–100.0)
Monocytes Absolute: 0.8 10*3/uL (ref 0.1–1.0)
Monocytes Relative: 9 %
Neutro Abs: 7.1 10*3/uL (ref 1.7–7.7)
Neutrophils Relative %: 76 %
Platelets: 229 10*3/uL (ref 150–400)
RBC: 4.11 MIL/uL (ref 3.87–5.11)
RDW: 13.8 % (ref 11.5–15.5)
WBC: 9.4 10*3/uL (ref 4.0–10.5)
nRBC: 0 % (ref 0.0–0.2)

## 2021-02-15 LAB — COMPREHENSIVE METABOLIC PANEL
ALT: 15 U/L (ref 0–44)
AST: 17 U/L (ref 15–41)
Albumin: 3.9 g/dL (ref 3.5–5.0)
Alkaline Phosphatase: 58 U/L (ref 38–126)
Anion gap: 10 (ref 5–15)
BUN: 14 mg/dL (ref 8–23)
CO2: 28 mmol/L (ref 22–32)
Calcium: 9.1 mg/dL (ref 8.9–10.3)
Chloride: 106 mmol/L (ref 98–111)
Creatinine, Ser: 0.98 mg/dL (ref 0.44–1.00)
GFR, Estimated: >60 mL/min (ref >60)
Glucose, Bld: 124 mg/dL — ABNORMAL HIGH (ref 70–99)
Potassium: 2.4 mmol/L — CL (ref 3.5–5.1)
Sodium: 144 mmol/L (ref 135–145)
Total Bilirubin: 0.8 mg/dL (ref 0.3–1.2)
Total Protein: 7.2 g/dL (ref 6.5–8.1)

## 2021-02-15 LAB — RESP PANEL BY RT-PCR (FLU A&B, COVID) ARPGX2
Influenza A by PCR: NEGATIVE
Influenza B by PCR: NEGATIVE
SARS Coronavirus 2 by RT PCR: NEGATIVE

## 2021-02-15 LAB — TROPONIN I (HIGH SENSITIVITY)
Troponin I (High Sensitivity): 14 ng/L (ref <18)
Troponin I (High Sensitivity): 17 ng/L (ref <18)

## 2021-02-15 LAB — MAGNESIUM: Magnesium: 1.9 mg/dL (ref 1.7–2.4)

## 2021-02-15 LAB — TSH: TSH: 2.038 u[IU]/mL (ref 0.350–4.500)

## 2021-02-15 MED ORDER — LORAZEPAM 2 MG/ML IJ SOLN: 1.0000 mg | INTRAMUSCULAR | Status: DC | PRN

## 2021-02-15 MED ORDER — SIMVASTATIN 40 MG PO TABS
40.0000 mg | ORAL_TABLET | Freq: Every day | ORAL | Status: DC
  Administered 2021-02-15 – 2021-02-19 (×5): 40 mg via ORAL
  Filled 2021-02-15 (×5): qty 1

## 2021-02-15 MED ORDER — SODIUM CHLORIDE 0.9 % IV BOLUS
1000.0000 mL | Freq: Once | INTRAVENOUS | Status: AC
  Administered 2021-02-15: 1000 mL via INTRAVENOUS

## 2021-02-15 MED ORDER — LACTATED RINGERS IV SOLN: INTRAVENOUS | Status: DC

## 2021-02-15 MED ORDER — LORAZEPAM 1 MG PO TABS
1.0000 mg | ORAL_TABLET | ORAL | Status: DC | PRN
  Administered 2021-02-15 – 2021-02-18 (×8): 1 mg via ORAL
  Filled 2021-02-15 (×8): qty 1

## 2021-02-15 MED ORDER — ACETAMINOPHEN 650 MG RE SUPP
650.0000 mg | Freq: Four times a day (QID) | RECTAL | Status: DC | PRN
Start: 2021-02-15 — End: 2021-02-19

## 2021-02-15 MED ORDER — PANTOPRAZOLE SODIUM 40 MG PO TBEC
40.0000 mg | DELAYED_RELEASE_TABLET | Freq: Every day | ORAL | Status: DC
  Administered 2021-02-16 – 2021-02-20 (×5): 40 mg via ORAL
  Filled 2021-02-15 (×5): qty 1

## 2021-02-15 MED ORDER — POTASSIUM CHLORIDE 10 MEQ/100ML IV SOLN
10.0000 meq | INTRAVENOUS | Status: AC
  Administered 2021-02-15 (×3): 10 meq via INTRAVENOUS
  Filled 2021-02-15 (×2): qty 100

## 2021-02-15 MED ORDER — MAGNESIUM SULFATE 2 GM/50ML IV SOLN
2.0000 g | Freq: Once | INTRAVENOUS | Status: AC
  Administered 2021-02-16: 2 g via INTRAVENOUS
  Filled 2021-02-15: qty 50

## 2021-02-15 MED ORDER — PROPRANOLOL HCL 20 MG PO TABS
40.0000 mg | ORAL_TABLET | Freq: Two times a day (BID) | ORAL | Status: DC
  Administered 2021-02-15 – 2021-02-19 (×7): 40 mg via ORAL
  Filled 2021-02-15 (×8): qty 2

## 2021-02-15 MED ORDER — CEPHALEXIN 500 MG PO CAPS: 500.0000 mg | ORAL_CAPSULE | Freq: Two times a day (BID) | ORAL | 0 refills | Status: DC

## 2021-02-15 MED ORDER — ZOLPIDEM TARTRATE 5 MG PO TABS
5.0000 mg | ORAL_TABLET | Freq: Every evening | ORAL | Status: DC | PRN
  Administered 2021-02-15 – 2021-02-19 (×4): 5 mg via ORAL
  Filled 2021-02-15 (×4): qty 1

## 2021-02-15 MED ORDER — METOCLOPRAMIDE HCL 5 MG/ML IJ SOLN
5.0000 mg | Freq: Once | INTRAMUSCULAR | Status: AC
  Administered 2021-02-15: 5 mg via INTRAMUSCULAR

## 2021-02-15 MED ORDER — GABAPENTIN 100 MG PO CAPS
100.0000 mg | ORAL_CAPSULE | Freq: Two times a day (BID) | ORAL | Status: DC
  Administered 2021-02-16 – 2021-02-18 (×5): 100 mg via ORAL
  Filled 2021-02-15 (×5): qty 1

## 2021-02-15 MED ORDER — MELATONIN 3 MG PO TABS
3.0000 mg | ORAL_TABLET | Freq: Every evening | ORAL | Status: DC | PRN
  Administered 2021-02-19: 3 mg via ORAL
  Filled 2021-02-15: qty 1

## 2021-02-15 MED ORDER — ACETAMINOPHEN 325 MG PO TABS
650.0000 mg | ORAL_TABLET | Freq: Four times a day (QID) | ORAL | Status: DC | PRN
  Administered 2021-02-15 – 2021-02-19 (×4): 650 mg via ORAL
  Filled 2021-02-15 (×4): qty 2

## 2021-02-15 MED ORDER — POTASSIUM CHLORIDE 10 MEQ/100ML IV SOLN
10.0000 meq | INTRAVENOUS | Status: AC
  Administered 2021-02-16: 10 meq via INTRAVENOUS
  Filled 2021-02-15 (×2): qty 100

## 2021-02-15 MED ORDER — LORAZEPAM 1 MG PO TABS
1.0000 mg | ORAL_TABLET | Freq: Once | ORAL | Status: AC
  Administered 2021-02-15: 1 mg via ORAL
  Filled 2021-02-15: qty 1

## 2021-02-15 NOTE — Discharge Instructions (Addendum)
Please take medications as prescribed With respect to generalized anxiety disorder with an acute attack now, I will recommend you speak with your daughter to look at your current medications on hand.  Records indicate that she may have some medications that would help with your anxiety attacks.  Follow-up with primary care physician and his psychiatrist for further management of your generalized anxiety disorder.

## 2021-02-15 NOTE — ED Provider Notes (Signed)
[facility]-EMERGENCY DEPT Provider Note   CSN: [id number] Arrival date & time: 02/15/21  1355     History Chief Complaint  Patient presents with   Emesis   Tremors    [name] is a 75 y.o. female.  The history is provided by the patient and a relative. No language interpreter was used.  Emesis Severity:  Mild Associated symptoms: abdominal pain, headaches and myalgias   Associated symptoms: no cough and no diarrhea   Chest Pain Pain location:  Substernal area Pain quality: aching   Pain radiates to:  Does not radiate Timing:  Intermittent Progression:  Waxing and waning Chronicity:  New Relieved by:  Nothing Worsened by:  Nothing Ineffective treatments:  None tried Associated symptoms: abdominal pain, anxiety, diaphoresis, fatigue, headache, nausea, palpitations and vomiting   Associated symptoms: no back pain, no cough, no dysphagia, no lower extremity edema, no shortness of breath and no weakness       Past Medical History:  Diagnosis Date   Anxiety    Back pain    Fibromyalgia    Hyperlipemia    Hypothyroidism    Neuropathy    Sinusitis    Stroke [facility])    no deficits    Patient Active Problem List   Diagnosis Date Noted   Hyperlipidemia 07/16/2020   Chronic kidney disease, stage III (moderate) (HCC) 07/16/2020   PE (pulmonary thromboembolism) (HCC) 05/27/2019   Esophageal dysphagia 08/10/2018   Back pain 05/05/2012   Fibromyalgia 05/05/2012   Chest pressure 04/03/2012   Carotid bruit 04/03/2012   Anxiety 10/05/2011    Past Surgical History:  Procedure Laterality Date   ABDOMINAL HYSTERECTOMY     BIOPSY  08/25/2018   Procedure: BIOPSY;  Surgeon: Malissa Hippo, MD;  Location: [facility];  Service: Endoscopy;;   CATARACT EXTRACTION Bilateral    CHOLECYSTECTOMY     ESOPHAGEAL DILATION N/A 06/27/2015   Procedure: ESOPHAGEAL DILATION;  Surgeon: Malissa Hippo, MD;  Location: [facility];  Service: Endoscopy;   Laterality: N/A;   ESOPHAGEAL DILATION N/A 08/25/2018   Procedure: ESOPHAGEAL DILATION;  Surgeon: Malissa Hippo, MD;  Location: [facility];  Service: Endoscopy;  Laterality: N/A;   ESOPHAGOGASTRODUODENOSCOPY (EGD) WITH PROPOFOL N/A 06/27/2015   Procedure: ESOPHAGOGASTRODUODENOSCOPY (EGD) WITH PROPOFOL;  Surgeon: Malissa Hippo, MD;  Location: [facility];  Service: Endoscopy;  Laterality: N/A;   ESOPHAGOGASTRODUODENOSCOPY (EGD) WITH PROPOFOL N/A 08/25/2018   Procedure: ESOPHAGOGASTRODUODENOSCOPY (EGD) WITH PROPOFOL;  Surgeon: Malissa Hippo, MD;  Location: [facility];  Service: Endoscopy;  Laterality: N/A;  9:30     OB History   No obstetric history on file.     Family History  Problem Relation Age of Onset   Depression Mother    Depression Father    Depression Sister    Anxiety disorder Sister    Anxiety disorder Sister    Alcohol abuse Neg Hx    Drug abuse Neg Hx     Social History   Tobacco Use   Smoking status: Former    Packs/day: 0.25    Years: 50.00    Pack years: 12.50    Types: Cigarettes    Start date: 06/02/2020   Smokeless tobacco: Never   Tobacco comments:    smokes 1/2 pack every 2 day.  Substance Use Topics   Alcohol use: No    Alcohol/week: 0.0 standard drinks   Drug use: No    Home Medications Prior to Admission  medications   Medication Sig Start Date End Date Taking? Authorizing Provider  amitriptyline (ELAVIL) 50 MG tablet Take 1 tablet (50 mg total) by mouth at bedtime. 10/29/20   Heather Roberts, NP  cephALEXin (KEFLEX) 500 MG capsule Take 1 capsule (500 mg total) by mouth 2 (two) times daily for 5 days. 02/15/21 02/20/21  LampteyBritta Mccreedy, MD  diazepam (VALIUM) 5 MG tablet Take 1 tablet (5 mg total) by mouth every 8 (eight) hours as needed for anxiety. 09/01/20   Heather Roberts, NP  furosemide (LASIX) 40 MG tablet Hold until outpatient followup because your blood pressure was low normal. 05/29/19   Darlin Priestly, MD  gabapentin (NEURONTIN) 100  MG capsule Take 100 mg by mouth 3 (three) times daily.     [provider]  meclizine (ANTIVERT) 25 MG tablet Take 25 mg by mouth 3 (three) times daily as needed for dizziness.    [provider]  metoCLOPramide (REGLAN) 5 MG tablet Take 5 mg by mouth every 8 (eight) hours as needed for nausea.     [provider]  pantoprazole (PROTONIX) 40 MG tablet Take 1 tablet (40 mg total) by mouth daily before breakfast. 09/24/20   Heather Roberts, NP  propranolol (INDERAL) 40 MG tablet Take 1 tablet (40 mg total) by mouth 2 (two) times daily. 10/29/20   Heather Roberts, NP  simvastatin (ZOCOR) 40 MG tablet Take 1 tablet (40 mg total) by mouth at bedtime. 10/13/20   Heather Roberts, NP    Allergies    Codeine and Prednisone  Review of Systems   Review of Systems  Constitutional:  Positive for diaphoresis and fatigue.  HENT:  Negative for congestion and trouble swallowing.   Eyes:  Negative for photophobia and visual disturbance.  Respiratory:  Negative for cough, chest tightness and shortness of breath.   Cardiovascular:  Positive for chest pain and palpitations.  Gastrointestinal:  Positive for abdominal pain, nausea and vomiting. Negative for diarrhea.  Genitourinary:  Positive for dysuria and frequency. Negative for flank pain.  Musculoskeletal:  Positive for myalgias. Negative for back pain and neck pain.  Skin:  Negative for rash and wound.  Neurological:  Positive for tremors and headaches. Negative for speech difficulty and weakness.       Tingling  Psychiatric/Behavioral:  Positive for agitation and sleep disturbance.   All other systems reviewed and are negative.  Physical Exam Updated Vital Signs BP (!) 148/82   Pulse 70   Temp 98.3 F (36.8 C)   Resp 18   SpO2 96%   Physical Exam Vitals and nursing note reviewed.  Constitutional:      General: She is not in acute distress.    Appearance: She is well-developed. She is not ill-appearing, toxic-appearing or  diaphoretic.  HENT:     Head: Normocephalic and atraumatic.     Nose: Nose normal.     Mouth/Throat:     Mouth: Mucous membranes are moist.     Pharynx: No oropharyngeal exudate or posterior oropharyngeal erythema.  Eyes:     Extraocular Movements: Extraocular movements intact.     Conjunctiva/sclera: Conjunctivae normal.     Pupils: Pupils are equal, round, and reactive to light.  Cardiovascular:     Rate and Rhythm: Normal rate and regular rhythm.     Heart sounds: No murmur heard. Pulmonary:     Effort: Pulmonary effort is normal. No respiratory distress.     Breath sounds: Normal breath sounds. No  wheezing, rhonchi or rales.  Chest:     Chest wall: No tenderness.  Abdominal:     General: Abdomen is flat. There is no distension.     Palpations: Abdomen is soft.     Tenderness: There is no abdominal tenderness. There is no guarding or rebound.  Musculoskeletal:        General: No tenderness.     Cervical back: Neck supple. No tenderness.  Skin:    General: Skin is warm and dry.     Capillary Refill: Capillary refill takes less than 2 seconds.     Coloration: Skin is not pale.     Findings: No bruising or erythema.  Neurological:     Mental Status: She is alert.  Psychiatric:        Mood and Affect: Mood is anxious.        Behavior: Behavior is agitated.        Thought Content: Thought content does not include homicidal or suicidal ideation. Thought content does not include homicidal or suicidal plan.    ED Results / Procedures / Treatments   Labs (all labs ordered are listed, but only abnormal results are displayed) Labs Reviewed  COMPREHENSIVE METABOLIC PANEL - Abnormal; Notable for the following components:      Result Value   Potassium 2.4 (*)    Glucose, Bld 124 (*)    All other components within normal limits  RESP PANEL BY RT-PCR (FLU A&B, COVID) [id number]  TSH  CBC WITH DIFFERENTIAL/PLATELET  MAGNESIUM  MAGNESIUM  COMPREHENSIVE METABOLIC PANEL  CBC  RAPID  URINE DRUG SCREEN, HOSP PERFORMED  METHYLMALONIC ACID, SERUM  TROPONIN I (HIGH SENSITIVITY)  TROPONIN I (HIGH SENSITIVITY)    EKG EKG Interpretation  Date/Time:  Sunday February 15 2021 15:57:47 EDT Ventricular Rate:  65 PR Interval:  190 QRS Duration: 100 QT Interval:  615 QTC Calculation: 640 R Axis:   55 Text Interpretation: Sinus rhythm Borderline T wave abnormalities Prolonged QT interval When compared to prior, longer QTC and QRS. No STEMI Confirmed by Theda Belfast ([id number]) on 02/15/2021 4:07:45 PM  Radiology No results found.  Procedures Procedures   Medications Ordered in ED Medications  potassium chloride 10 mEq in 100 mL IVPB (10 mEq Intravenous New Bag/Given 02/15/21 2325)  acetaminophen (TYLENOL) tablet 650 mg (650 mg Oral Given 02/15/21 2226)    Or  acetaminophen (TYLENOL) suppository 650 mg ( Rectal See Alternative 02/15/21 2226)  magnesium sulfate IVPB 2 g 50 mL (has no administration in time range)  lactated ringers infusion ( Intravenous New Bag/Given 02/15/21 2214)  gabapentin (NEURONTIN) capsule 100 mg (has no administration in time range)  pantoprazole (PROTONIX) EC tablet 40 mg (has no administration in time range)  propranolol (INDERAL) tablet 40 mg (40 mg Oral Given 02/15/21 2226)  simvastatin (ZOCOR) tablet 40 mg (40 mg Oral Given 02/15/21 2226)  melatonin tablet 3 mg (has no administration in time range)  zolpidem (AMBIEN) tablet 5 mg (5 mg Oral Given 02/15/21 2214)  potassium chloride 10 mEq in 100 mL IVPB (has no administration in time range)  LORazepam (ATIVAN) tablet 1 mg (1 mg Oral Given 02/15/21 2226)  LORazepam (ATIVAN) tablet 1 mg (1 mg Oral Given 02/15/21 1606)  sodium chloride 0.9 % bolus 1,000 mL (1,000 mLs Intravenous New Bag/Given 02/15/21 1949)    ED Course  I have reviewed the triage vital signs and the nursing notes.  Pertinent labs & imaging results that were available during my care of the patient  were reviewed by me and considered in my medical  decision making (see chart for details).    MDM Rules/Calculators/A&P                           Milana Obeylizabeth A Westerhoff is a 75 y.o. female with a past medical history significant for CKD, hyperlipidemia, previous pulmonary embolism, stroke, fibromyalgia, hypothyroidism, and severe anxiety who was diagnosed with urinary tract infection earlier today and was told to come in for further evaluation of her anxiety and withdrawal symptoms.  According to patient, around 1 month ago, she saw a new doctor who stopped her amitriptyline.  She reports that she was given a prescription to start duloxetine but did not take it after several days because she did not like it.  She reports has not been on any other antidepressant medications.  She says that for the last month she has had worsening symptoms including nausea, vomiting, headaches, myalgias, fatigue, insomnia, agitation, anxiety, palpitations, and chest discomfort.  She says that for the last few days she has had some lower abdominal discomfort with dysuria, hematuria, and frequency and was diagnosed with UTI today.  She was given prescription for Keflex which she has not yet started taking.  Her new provider reportedly told her to see a psychiatrist before he will feel comfortable making changes to her antidepressant regimen.  Patient otherwise denies fevers, chills, congestion, cough, constipation, diarrhea, or trauma.  On exam, patient is visibly anxious and shaking.  Her chest was nontender and lungs were clear.  Abdomen was nontender on my exam.  Patient moving all extremities and did not have any focal deficits aside from the agitation.  She is tearful and shaking in the room.  She denies SI or HI but says that she wants some help.  She is very nauseous now.  EKG on arrival shows prolonged QTC of over 600 at 640.  She also has a QRS of around 100 ms.  No STEMI seen.  Due to her prolonged QTC, will give her some Ativan to help with nausea as well as the  acute agitation.  We will get screening labs to look for SNF electrolyte abnormalities.  Clinically I suspect that some of her withdrawal symptoms are due to the loss of tricyclic antidepressant that she was on for many years before stopping abruptly without a taper.  Due to the patient's palpitations and chest discomfort and EKG abnormalities I am concerned that that may be playing a role.  We will get screening labs.  Anticipate TTS consultation to ask for medication recommendations.  7:05 PM Work-up began to return and patient does have hypokalemia with a potassium of 3.4.  This is likely due to her decreased oral intake with nausea and vomiting and may be contributing to her palpitations and chest discomfort.  Her EKG was abnormal as reviewed.  Otherwise her labs showed normal troponin x2 and normal TSH.  CBC reassuring.  Magnesium normal.   I messaged the psychiatry team who recommended a formal psychiatry consult but They agree that her symptoms may be also due to the amitriptyline withdrawal.  They recommended starting a low-dose amitriptyline and formal psychiatry consultation to help determine further management.  Due to the palpitations, chest discomfort, abnormal EKG, low potassium, and her nausea and vomiting, will call for admission to medicine for electrolyte repletion, cardiac monitoring, and formal psychiatry evaluation and recommendations.  Psychiatry via message recommended starting Elavil at 25 mg  daily.  Due to her significant prolonged QTC, will hold initially.  We will give the potassium and fluids and see how her EKG looks before initiating the Elavil.  Medicine team will be called for admission.  Final Clinical Impression(s) / ED Diagnoses Final diagnoses:  Hypokalemia  Withdrawal complaint  Palpitations  Chest pain, unspecified type  Non-intractable vomiting with nausea, unspecified vomiting type    Rx / DC Orders ED Discharge Orders     None      Clinical  Impression: 1. Hypokalemia   2. Withdrawal complaint   3. Palpitations   4. Chest pain, unspecified type   5. Non-intractable vomiting with nausea, unspecified vomiting type     Disposition: Admit  This note was prepared with assistance of Dragon voice recognition software. Occasional wrong-word or sound-a-like substitutions may have occurred due to the inherent limitations of voice recognition software.     Dima Ferrufino, Canary Brim, MD 02/16/21 [phone]

## 2021-02-15 NOTE — H&P (Signed)
History and Physical    PLEASE NOTE THAT DRAGON DICTATION SOFTWARE WAS USED IN THE CONSTRUCTION OF THIS NOTE.   [name]:[id number] DOB: [birth year] DOA: 02/15/2021  PCP: Avon Gully, MD Patient coming from: home   I have personally briefly reviewed patient's old medical records in [facility]  Chief Complaint: Nausea vomiting  HPI: [name] is a 75 y.o. female with medical history significant for generalized anxiety disorder, hyperlipidemia, peripheral neuropathy, GERD, who is admitted to [facility] on 02/15/2021 with hypokalemia after presenting from home to [facility] ED complaining of nausea vomiting.   The patient reports a longstanding history of generalized anxiety disorder which she states was well controlled on scheduled amitriptyline for several years.  However, she reports recently switching PCPs, with new PCP reportedly recommending discontinuation of her amitriptyline in favor different anxiolytic medication.  Correspondingly, the patient reports that her amitriptyline was abruptly discontinued without taper 1 month ago with initiation of an unclear replacement anxiety lytic medication at that time.  However, patient reports her finding that the new anxiolytic medication is not been as efficacious for her in managing her anxiety relative to the previous amitriptyline, prompting her to discontinue this medication a few weeks ago, without incident resumption of the previously taken amitriptyline.    Within 3 to 4 days of the abrupt discontinuation of her amlodipine a month ago, the patient reports development of intermittent nausea and vomiting as well as intermittent headaches.  She reports continued persistence of her intermittent nausea/vomiting, stating that she has typically been experiencing 2-3 episodes of nonbloody, nonbilious emesis per day over the last 3 weeks, with most recent such episode occurring just prior to presentation to [facility] long emergency  department this evening.  She denies any associated abdominal discomfort, diarrhea, melena, or hematochezia.  She also denies any associated subjective fever, chills, rigors, or generalized myalgias.  Denies any associated chest pain, shortness of breath, diaphoresis, palpitations, dizziness, presyncope, or syncope.  No recent traveling or known COVID-19 exposures.  Denies any recent dysuria or change in urinary urgency/frequency.  No recent cough or hemoptysis.  She also denies any recent rhinitis, rhinorrhea, sore throat, neck stiffness, or rash.  She denies any regular or recent alcohol consumption, and denies use of recreational drugs.  In the setting of perpetuation of her nausea/vomiting, she has been taking as needed Reglan for its antiemetic properties, but notes no significant symptomatic relief with this measure.  Over the last week, in the setting of persistence of nausea/vomiting and associated decline in oral intake, the patient reports generalized weakness. Denies any associated acute focal weakness, acute focal numbness, paresthesias, slurred speech, facial droop, expressive aphasia, acute change in vision, dysphagia, or vertigo.  Concomitant with the onset of intermittent nausea/vomiting 3 weeks ago, the patient reports that she has been experiencing frequent symmetrical, frontal lobe headaches on a daily basis in the interval.  She denies any associated unilateral aspect of these headaches, and denies any associated visual or olfactory aura.      ED Course:  Vital signs in the ED were notable for the following: Afebrile; heart rate 64-89; initial blood pressure 92/73, with ensuing increased to 168/67 following interval IV fluids, as further quantified below; respiratory 14-19, oxygen saturation 96 to 100% on room air.  Labs were notable for the following: CMP was notable for the following: Sodium 144, potassium 2.4, bicarbonate 23, creatinine 0.98 relative to 1.1 on 01/05/2021, glucose 124,  liver enzymes found to be within  normal limits.  Serum magnesium level 1.9.  TSH 2.038.  CBC notable for white blood cell count 9400.  Urinalysis showed no white blood cells, while demonstrating specific gravity of 1.020.  Screening nasopharyngeal COVID-19/influenza PCR were checked in the ED today, with result currently pending.  Imaging and additional notable ED work-up: EKG shows sinus rhythm with heart rate 65, QTC 640 ms, compared to 462 on most recent prior EKG from January 2021, while showing no evidence of T wave or ST changes, including no evidence of ST elevation.  EDP discussed the patient's case with the on-call psychiatrist, who felt that the presentation, including ongoing nausea/vomiting and headaches, was most suggestive of continued amitriptyline withdrawal, noting that withdrawal symptoms from abrupt amitriptyline discontinuation can occur for over a month following its discontinuation.  Psychiatry was requested to formally consult, and they recommended resumption of amitriptyline, once improvement in QTC prolongation, but at a lower dose than her discontinuation dose, with ensuing slow taper to reduce risk for furthering of amitriptyline withdrawal symptoms.   While in the ED, the following were administered: Ativan 1 mg p.o. x1, potassium chloride 30 mEq IV over 3 hours x 1 dose, normal saline x1 L bolus.  Subsequently, the patient was admitted to the med telemetry floor for further evaluation and management of presenting hypokalemia in the setting of dehydration stemming from presenting nausea/vomiting, complicated by QTC prolongation.     Review of Systems: As per HPI otherwise 10 point review of systems negative.   Past Medical History:  Diagnosis Date   Anxiety    Back pain    Fibromyalgia    Hyperlipemia    Hypothyroidism    Neuropathy    Sinusitis    Stroke [facility])    no deficits    Past Surgical History:  Procedure Laterality Date   ABDOMINAL HYSTERECTOMY      BIOPSY  08/25/2018   Procedure: BIOPSY;  Surgeon: Malissa Hippo, MD;  Location: [facility];  Service: Endoscopy;;   CATARACT EXTRACTION Bilateral    CHOLECYSTECTOMY     ESOPHAGEAL DILATION N/A 06/27/2015   Procedure: ESOPHAGEAL DILATION;  Surgeon: Malissa Hippo, MD;  Location: [facility];  Service: Endoscopy;  Laterality: N/A;   ESOPHAGEAL DILATION N/A 08/25/2018   Procedure: ESOPHAGEAL DILATION;  Surgeon: Malissa Hippo, MD;  Location: [facility];  Service: Endoscopy;  Laterality: N/A;   ESOPHAGOGASTRODUODENOSCOPY (EGD) WITH PROPOFOL N/A 06/27/2015   Procedure: ESOPHAGOGASTRODUODENOSCOPY (EGD) WITH PROPOFOL;  Surgeon: Malissa Hippo, MD;  [facility]: [facility];  Service: Endoscopy;  Laterality: N/A;   ESOPHAGOGASTRODUODENOSCOPY (EGD) WITH PROPOFOL N/A 08/25/2018   Procedure: ESOPHAGOGASTRODUODENOSCOPY (EGD) WITH PROPOFOL;  Surgeon: Malissa Hippo, MD;  Location: [facility];  Service: Endoscopy;  Laterality: N/A;  9:30    Social History:  reports that she has quit smoking. Her smoking use included cigarettes. She started smoking about 8 months ago. She has a 12.50 pack-year smoking history. She has never used smokeless tobacco. She reports that she does not drink alcohol and does not use drugs.   Allergies  Allergen Reactions   Codeine Nausea Only   Prednisone Palpitations and Hypertension    Family History  Problem Relation Age of Onset   Depression Mother    Depression Father    Depression Sister    Anxiety disorder Sister    Anxiety disorder Sister    Alcohol abuse Neg Hx    Drug abuse Neg Hx     Family history reviewed  and not pertinent    Prior to Admission medications   Medication Sig Start Date End Date Taking? Authorizing Provider  amitriptyline (ELAVIL) 50 MG tablet Take 1 tablet (50 mg total) by mouth at bedtime. 10/29/20   Heather Roberts, NP  cephALEXin (KEFLEX) 500 MG capsule Take 1 capsule (500 mg total) by mouth 2 (two) times daily for 5 days.  02/15/21 02/20/21  LampteyBritta Mccreedy, MD  diazepam (VALIUM) 5 MG tablet Take 1 tablet (5 mg total) by mouth every 8 (eight) hours as needed for anxiety. 09/01/20   Heather Roberts, NP  furosemide (LASIX) 40 MG tablet Hold until outpatient followup because your blood pressure was low normal. 05/29/19   Darlin Priestly, MD  gabapentin (NEURONTIN) 100 MG capsule Take 100 mg by mouth 3 (three) times daily.     [provider]  meclizine (ANTIVERT) 25 MG tablet Take 25 mg by mouth 3 (three) times daily as needed for dizziness.    [provider]  metoCLOPramide (REGLAN) 5 MG tablet Take 5 mg by mouth every 8 (eight) hours as needed for nausea.     [provider]  pantoprazole (PROTONIX) 40 MG tablet Take 1 tablet (40 mg total) by mouth daily before breakfast. 09/24/20   Heather Roberts, NP  propranolol (INDERAL) 40 MG tablet Take 1 tablet (40 mg total) by mouth 2 (two) times daily. 10/29/20   Heather Roberts, NP  simvastatin (ZOCOR) 40 MG tablet Take 1 tablet (40 mg total) by mouth at bedtime. 10/13/20   Heather Roberts, NP     Objective    Physical Exam: Vitals:   02/15/21 1800 02/15/21 1815 02/15/21 1830 02/15/21 1845  BP: 125/70 136/70 (!) 151/59 (!) 162/62  Pulse:  65 64 70  Resp: Temp:      SpO2:  96% 97% 98%    General: appears to be stated age; alert, oriented Skin: warm, dry, no rash Head:  AT/[place] Mouth:  Oral mucosa membranes appear dry, normal dentition Neck: supple; trachea midline Heart:  RRR; did not appreciate any M/R/G Lungs: CTAB, did not appreciate any wheezes, rales, or rhonchi Abdomen: + BS; soft, ND, NT Vascular: 2+ pedal pulses b/l; 2+ radial pulses b/l Extremities: no peripheral edema, no muscle wasting Neuro: strength and sensation intact in upper and lower extremities b/l    Labs on Admission: I have personally reviewed following labs and imaging studies  CBC: Recent Labs  Lab 02/15/21 1555  WBC 9.4  NEUTROABS 7.1  HGB 12.8  HCT  38.4  MCV 93.4  PLT 229   Basic Metabolic Panel: Recent Labs  Lab 02/15/21 1555  NA 144  K 2.4*  CL 106  CO2 28  GLUCOSE 124*  BUN 14  CREATININE 0.98  CALCIUM 9.1  MG 1.9   GFR: CrCl cannot be calculated (Unknown ideal weight.). Liver Function Tests: Recent Labs  Lab 02/15/21 1555  AST 17  ALT 15  ALKPHOS 58  BILITOT 0.8  PROT 7.2  ALBUMIN 3.9   No results for input(s): LIPASE, AMYLASE in the last 168 hours. No results for input(s): AMMONIA in the last 168 hours. Coagulation Profile: No results for input(s): INR, PROTIME in the last 168 hours. Cardiac Enzymes: No results for input(s): CKTOTAL, CKMB, CKMBINDEX, TROPONINI in the last 168 hours. BNP (last 3 results) No results for input(s): PROBNP in the last 8760 hours. HbA1C: No results for input(s): HGBA1C in the last 72 hours. CBG:  No results for input(s): GLUCAP in the last 168 hours. Lipid Profile: No results for input(s): CHOL, HDL, LDLCALC, TRIG, CHOLHDL, LDLDIRECT in the last 72 hours. Thyroid Function Tests: Recent Labs    02/15/21 1555  TSH 2.038   Anemia Panel: No results for input(s): VITAMINB12, FOLATE, FERRITIN, TIBC, IRON, RETICCTPCT in the last 72 hours. Urine analysis:    Component Value Date/Time   COLORURINE YELLOW 07/02/2019 1406   APPEARANCEUR CLEAR 07/02/2019 1406   LABSPEC 1.013 07/02/2019 1406   PHURINE 6.0 07/02/2019 1406   GLUCOSEU NEGATIVE 07/02/2019 1406   HGBUR SMALL (A) 07/02/2019 1406   BILIRUBINUR large (A) 02/15/2021 0832   KETONESUR trace (5) (A) 02/15/2021 0832   KETONESUR NEGATIVE 07/02/2019 1406   PROTEINUR =30 (A) 02/15/2021 0832   PROTEINUR NEGATIVE 07/02/2019 1406   UROBILINOGEN 2.0 (A) 02/15/2021 0832   UROBILINOGEN 0.2 04/01/2012 1815   NITRITE Negative 02/15/2021 0832   NITRITE NEGATIVE 07/02/2019 1406   LEUKOCYTESUR Moderate (2+) (A) 02/15/2021 0832   LEUKOCYTESUR NEGATIVE 07/02/2019 1406    Radiological Exams on Admission: No results  found.   EKG: Independently reviewed, with result as described above.    Assessment/Plan   [name] is a 75 y.o. female with medical history significant for generalized anxiety disorder, hyperlipidemia, peripheral neuropathy, GERD, who is admitted to [facility] on 02/15/2021 with hypokalemia after presenting from home to [facility] ED complaining of nausea vomiting.    Principal Problem:   Hypokalemia Active Problems:   Anxiety   Nausea & vomiting   Dehydration   Prolonged QT interval   Generalized weakness   Neuropathy   GERD (gastroesophageal reflux disease)    #) Hypokalemia: Presenting serum potassium level noted to be 2.4 this appears to be as a consequence of increased GI losses in the setting of 3 weeks of daily nausea/vomiting and associated decline in oral intake.  She received 30 mill equivalents of IV potassium chloride in the ED. we will provide additional IV potassium supplementation overnight, as further quantified below.  Of note, presenting serum magnesium level found to be 1.9.  Further optimizing magnesium level with additional IV magnesium supplementation ordered to provide expedited improvement in her presenting hypokalemic finding.  Plan: I have ordered an additional 20 equivalents of IV potassium chloride to be administered over 2 hours x 1 dose now.  Monitor on telemetry.  Magnesium sulfate 2 g IV every 2 hours x1 dose now.  Repeat serum magnesium level as well as BMP in the morning.  Further evaluation management of presenting nausea/vomiting, as further detailed below.       #) Nausea/vomiting: 3 weeks of intermittent nausea resulting in multiple daily episodes of nonbloody, nonbilious emesis, contributing to presenting dehydration as well as hypokalemia, as further detailed above.  Prior discussions with psychiatry this evening,/vomiting appears to be a consequence of amitriptyline withdrawal, as further detailed below.  Symptomatic management of  patient's nausea/vomiting while attending directly to her amitriptyline withdrawal is complicated by the presence of QTC prolongation lipid A antiemetic options at this time.  In this context, will pursue as needed IV Ativan for its antiemetic and anxiolytic properties.   Plan: As needed IV Ativan, as above.  Continuous lactated Ringer's.  Further evaluation management of presenting hypokalemia, as above.  Repeat CMP and CBC in the morning.  Further evaluation management of suspected amitriptyline withdrawal, as further detailed below.     #) Dehydration: In the setting of patient's report of 3 weeks of daily nausea/vomiting  associated decline in oral intake, she has physical exam and laboratory evidence to commiserated presentation consistent with dehydration, in the form of the appearance of dry oral mucous membranes as well as elevated specific gravity identified on presenting urinalysis.  Additionally, her initial soft blood pressure has improved following IV fluid administration, further consistent with presenting element of dehydration.  Plan: Continuous lactated Ringer's at 75 cc/h.  Further evaluation management of suspected contributory amitriptyline withdrawal, as further detailed below along with symptomatic management of associated nausea/vomiting via as needed IV Ativan.  Repeat CMP and CBC in the morning.  Monitor strict I's and O's.      #) QTC prolongation: Presenting EKG reflects QTC of 640 ms, is relative to most recent prior EKG, which demonstrated a QTC of 462 ms.  Suspect contribution from recent increase in as needed Reglan use for antiemetic properties in the setting of nausea/vomiting as a consequence of perceived amitriptyline withdrawal.  Presence of QTC prolongation complicates psychiatry recommendation for low-dose resumption of amitriptyline, as further detailed below.   Plan: Monitor on telemetry.  Magnesium sulfate 2 g IV every 2 hours x1 dose now.  Hold home as  needed Reglan.  Repeat EKG in the morning for monitoring of ensuing QTc interval.  Refraining from amitriptyline use for now, pending improvement in QTC prolongation.      #) Amitriptyline withdrawal: who felt that the presentation, including ongoing nausea/vomiting and headaches, was most suggestive of continued amitriptyline withdrawal, noting that withdrawal symptoms from abrupt amitriptyline discontinuation can occur for over a month following its discontinuation.  Psychiatry was requested to formally consult, and they recommended resumption of amitriptyline, once improvement in QTC prolongation, but at a lower dose than her discontinuation dose, with ensuing slow taper to reduce risk for furthering of amitriptyline withdrawal symptoms.   Plan: Further evaluation management of QTC prolongation, as further detailed above, including monitoring on telemetry with repeat EKG ordered for the morning.  Refraining from reintroduction of low-dose amitriptyline with associated recommendation for taper until improvement in QTC prolongation.  Psychiatry consulted, as above.  UDS.      #) Generalized anxiety disorder: Per patient, this is been poorly controlled following abrupt transition from amitriptyline to an unclear agent 1 month ago.  In the context of presenting nausea/vomiting as well as QTC prolongation, will treat overnight with as needed IV Ativan, while awaiting additional recommendations from psychiatry consultation, as further detailed above.  It appears that she is also on scheduled propanolol as an outpatient.   Plan: As needed IV Ativan overnight.  Continue home propanolol.      #) Generalized weakness: the patient reports 1 week duration of generalized weakness, in the absence of any evidence of acute focal neurologic deficits, including no evidence of acute focal weakness. Consequently, acute ischemic CVA is felt to be less likely at this time.  Does not appear septic, no evidence of  underlying infectious process at this time, including urinalysis performed earlier today, which was inconsistent with UTI in the absence of any associated white blood cells.  COVID-19 PCR checked in the ED today, with result currently pending.  Overall, suspect contribution from physiologic stress stemming from presenting dehydration as a consequence of her recent nausea/vomiting, as further detailed above.  TSH found to be within normal limits.   Plan: work-up and management of presenting dehydration in the setting of nausea/vomiting, as described above. Physical therapy consult has been placed.  Check MMA.  Continuous IV fluids overnight, as above.  Repeat  CMP and CBC in the morning.      #) Peripheral neuropathy: Documented history of such, with the patient on gabapentin at home.  Plan: Continue gabapentin.  Repeat CMP in the morning.      #) GERD: Documented history of such, with the patient on Protonix at home.  Plan: Continue home PPI.      DVT prophylaxis: SCDs Code Status: Full code Family Communication: none Disposition Plan: Per Rounding Team Consults called: Case discussed with on-call psychiatry, as further detailed above;  Admission status: Inpatient; med telemetry    Of note, this patient was added by me to the following Admit List/Treatment Team: [email or web address].      PLEASE NOTE THAT DRAGON DICTATION SOFTWARE WAS USED IN THE CONSTRUCTION OF THIS NOTE.   Angie FavaJustin B Altagracia Rone DO [facility] Pager [phone] From 6PM - 2AM  Otherwise, please contact night-coverage  [email or web address] Password [email or web address]   02/15/2021, 7:36 PM

## 2021-02-15 NOTE — ED Provider Notes (Addendum)
[facility]    CSN: [id number] Arrival date & time: 02/15/21  0801      History   Chief Complaint No chief complaint on file.   HPI [name] is a 75 y.o. female comes to urgent care with 1 month history of increasing anxiety.  Patient has generalized anxiety disorder and was prescribed Cymbalta by the primary care physician.  The patient stopped taking the Cymbalta a week ago and since then she has developed some vibrating sensation in her legs and arms, increased anxiety, headache and nausea.  She has numbness around her fingers and her feet and has palpitations.  She is unable to sleep.  Patient also complains of blood in the urine with some lower abdominal pain.  No fever or chills.  No flank pain.  HPI  Past Medical History:  Diagnosis Date   Anxiety    Back pain    Fibromyalgia    Hyperlipemia    Hypothyroidism    Neuropathy    Sinusitis    Stroke [facility])    no deficits    Patient Active Problem List   Diagnosis Date Noted   Hyperlipidemia 07/16/2020   Chronic kidney disease, stage III (moderate) (HCC) 07/16/2020   PE (pulmonary thromboembolism) (HCC) 05/27/2019   Esophageal dysphagia 08/10/2018   Back pain 05/05/2012   Fibromyalgia 05/05/2012   Chest pressure 04/03/2012   Carotid bruit 04/03/2012   Anxiety 10/05/2011    Past Surgical History:  Procedure Laterality Date   ABDOMINAL HYSTERECTOMY     BIOPSY  08/25/2018   Procedure: BIOPSY;  Surgeon: Malissa Hippo, MD;  [facility];  Service: Endoscopy;;   CATARACT EXTRACTION Bilateral    CHOLECYSTECTOMY     ESOPHAGEAL DILATION N/A 06/27/2015   Procedure: ESOPHAGEAL DILATION;  Surgeon: Malissa Hippo, MD;  Location: [facility];  Service: Endoscopy;  Laterality: N/A;   ESOPHAGEAL DILATION N/A 08/25/2018   Procedure: ESOPHAGEAL DILATION;  Surgeon: Malissa Hippo, MD;  [facility];  Service: Endoscopy;  Laterality: N/A;   ESOPHAGOGASTRODUODENOSCOPY (EGD) WITH  PROPOFOL N/A 06/27/2015   Procedure: ESOPHAGOGASTRODUODENOSCOPY (EGD) WITH PROPOFOL;  Surgeon: Malissa Hippo, MD;  [facility];  Service: Endoscopy;  Laterality: N/A;   ESOPHAGOGASTRODUODENOSCOPY (EGD) WITH PROPOFOL N/A 08/25/2018   Procedure: ESOPHAGOGASTRODUODENOSCOPY (EGD) WITH PROPOFOL;  Surgeon: Malissa Hippo, MD;  [facility];  Service: Endoscopy;  Laterality: N/A;  9:30    OB History   No obstetric history on file.      Home Medications    Prior to Admission medications   Medication Sig Start Date End Date Taking? Authorizing Provider  cephALEXin (KEFLEX) 500 MG capsule Take 1 capsule (500 mg total) by mouth 2 (two) times daily for 5 days. 02/15/21 02/20/21 Yes Terilyn Sano, Britta Mccreedy, MD  amitriptyline (ELAVIL) 50 MG tablet Take 1 tablet (50 mg total) by mouth at bedtime. 10/29/20   Heather Roberts, NP  diazepam (VALIUM) 5 MG tablet Take 1 tablet (5 mg total) by mouth every 8 (eight) hours as needed for anxiety. 09/01/20   Heather Roberts, NP  furosemide (LASIX) 40 MG tablet Hold until outpatient followup because your blood pressure was low normal. 05/29/19   Darlin Priestly, MD  gabapentin (NEURONTIN) 100 MG capsule Take 100 mg by mouth 3 (three) times daily.     [provider]  meclizine (ANTIVERT) 25 MG tablet Take 25 mg by mouth 3 (three) times daily as needed for dizziness.  [provider]  metoCLOPramide (REGLAN) 5 MG tablet Take 5 mg by mouth every 8 (eight) hours as needed for nausea.     [provider]  pantoprazole (PROTONIX) 40 MG tablet Take 1 tablet (40 mg total) by mouth daily before breakfast. 09/24/20   Heather RobertsGray, Joseph M, NP  propranolol (INDERAL) 40 MG tablet Take 1 tablet (40 mg total) by mouth 2 (two) times daily. 10/29/20   Heather RobertsGray, Joseph M, NP  simvastatin (ZOCOR) 40 MG tablet Take 1 tablet (40 mg total) by mouth at bedtime. 10/13/20   Heather RobertsGray, Joseph M, NP    Family History Family History  Problem Relation Age of Onset    Depression Mother    Depression Father    Depression Sister    Anxiety disorder Sister    Anxiety disorder Sister    Alcohol abuse Neg Hx    Drug abuse Neg Hx     Social History Social History   Tobacco Use   Smoking status: Former    Packs/day: 0.25    Years: 50.00    Pack years: 12.50    Types: Cigarettes    Start date: 06/02/2020   Smokeless tobacco: Never   Tobacco comments:    smokes 1/2 pack every 2 day.  Substance Use Topics   Alcohol use: No    Alcohol/week: 0.0 standard drinks   Drug use: No     Allergies   Codeine and Prednisone   Review of Systems Review of Systems  Constitutional: Negative.   Gastrointestinal:  Positive for nausea. Negative for vomiting.  Genitourinary:  Positive for dysuria, frequency, hematuria and urgency. Negative for flank pain and vaginal discharge.  Neurological:  Positive for headaches.  Psychiatric/Behavioral:  Negative for confusion. The patient is nervous/anxious.     Physical Exam Triage Vital Signs ED Triage Vitals  Enc Vitals Group     BP 02/15/21 0823 (!) 158/86     Pulse Rate 02/15/21 0823 60     Resp 02/15/21 0823 18     Temp 02/15/21 0823 97.9 F (36.6 C)     Temp Source 02/15/21 0823 Oral     SpO2 02/15/21 0823 95 %     Weight --      Height --      Head Circumference --      Peak Flow --      Pain Score 02/15/21 0815 10     Pain Loc --      Pain Edu? --      Excl. in GC? --    No data found.  Updated Vital Signs BP (!) 158/86 (BP Location: Right Arm)   Pulse 60   Temp 97.9 F (36.6 C) (Oral)   Resp 18   SpO2 95%   Visual Acuity Right Eye Distance:   Left Eye Distance:   Bilateral Distance:    Right Eye Near:   Left Eye Near:    Bilateral Near:     Physical Exam Vitals and nursing note reviewed.  Constitutional:      General: She is not in acute distress.    Appearance: She is not ill-appearing.     Comments: Anxious looking patient  HENT:     Right Ear: Tympanic membrane normal.      Left Ear: Tympanic membrane normal.  Cardiovascular:     Rate and Rhythm: Normal rate and regular rhythm.  Abdominal:     General: Bowel sounds are normal.  Musculoskeletal:  General: Normal range of motion.  Skin:    General: Skin is warm.  Neurological:     General: No focal deficit present.     Mental Status: She is alert and oriented to person, place, and time.     UC Treatments / Results  Labs (all labs ordered are listed, but only abnormal results are displayed) Labs Reviewed  POCT URINALYSIS DIP (MANUAL ENTRY) - Abnormal; Notable for the following components:      Result Value   Color, UA red (*)    Clarity, UA cloudy (*)    Bilirubin, UA large (*)    Ketones, POC UA trace (5) (*)    Blood, UA large (*)    Protein Ur, POC =30 (*)    Urobilinogen, UA 2.0 (*)    Leukocytes, UA Moderate (2+) (*)    All other components within normal limits  URINE CULTURE    EKG   Radiology No results found.  Procedures Procedures (including critical care time)  Medications Ordered in UC Medications  metoCLOPramide (REGLAN) injection 5 mg (has no administration in time range)    Initial Impression / Assessment and Plan / UC Course  I have reviewed the triage vital signs and the nursing notes.  Pertinent labs & imaging results that were available during my care of the patient were reviewed by me and considered in my medical decision making (see chart for details).     1.  Acute cystitis with hematuria: Keflex 500 mg twice daily for 5 days Urine cultures have been sent We will call patient with recommendations if labs are abnormal 5 mg Reglan IM x1 dose for nausea  2.  Generalized anxiety disorder: Patient is noncompliant with a prescription medications Patient is on multiple medications for anxiety but she apparently does not have any of them on hand.  Her daughter apparently has those medications and she claims that she does not get any of those medications to  take.  The patient's sister, who was in the room, called the patient's daughter.  The daughter did not respond to the phone calls.  Given the multiple medication she is on I am hesitant to prescribe more medications.  At this time I think the patient symptoms worsen because she stopped taking Cymbalta a few days ago.  She is also stopped following up with her psychiatrist because she says she does not see any benefit to it.  I encouraged her to have a frank discussion with her daughter to give her some of her medications to help with anxiety.  Medication compliance was emphasized.  Patient is not suicidal or homicidal at this time. Final Clinical Impressions(s) / UC Diagnoses   Final diagnoses:  Acute cystitis with hematuria  Generalized anxiety disorder     Discharge Instructions      Please take medications as prescribed With respect to generalized anxiety disorder with an acute attack now, I will recommend you speak with your daughter to look at your current medications on hand.  Records indicate that she may have some medications that would help with your anxiety attacks.  Follow-up with primary care physician and his psychiatrist for further management of your generalized anxiety disorder.   ED Prescriptions     Medication Sig Dispense Auth. Provider   cephALEXin (KEFLEX) 500 MG capsule Take 1 capsule (500 mg total) by mouth 2 (two) times daily for 5 days. 10 capsule Stewart Pimenta, Britta Mccreedy, MD      PDMP not reviewed this encounter.  Merrilee Jansky, MD 02/15/21 6203    Merrilee Jansky, MD 02/15/21 0930    Merrilee Jansky, MD 02/15/21 0930

## 2021-02-15 NOTE — ED Notes (Signed)
K 2.4, MD Tegeler aware.

## 2021-02-15 NOTE — ED Notes (Signed)
Patient transported to floor with monitor.  All personal belongings taken to floor with patient

## 2021-02-15 NOTE — ED Triage Notes (Signed)
Blisters in mouth from dentures x 1 month.  Was placed on Cymbalta a month ago and started to feel worse.  Stopped taking Cymbalta earlier this week.  States her legs and feet are vibrating and now the vibration feeling is moving up her body.  Headache and feeling sick on stomach x 1 month.  States she has blood in her urine since yesterday and now she is having lower abd pain.  States she is having trouble sleeping.  States her nerves are "shot"

## 2021-02-15 NOTE — ED Triage Notes (Signed)
Pt reports emesis, tremors, and weakness since the beginning of August after stopping a medication for her nerve pain.

## 2021-02-16 DIAGNOSIS — R079 Chest pain, unspecified: Secondary | ICD-10-CM

## 2021-02-16 DIAGNOSIS — R9431 Abnormal electrocardiogram [ECG] [EKG]: Secondary | ICD-10-CM | POA: Diagnosis present

## 2021-02-16 DIAGNOSIS — G629 Polyneuropathy, unspecified: Secondary | ICD-10-CM

## 2021-02-16 DIAGNOSIS — R112 Nausea with vomiting, unspecified: Secondary | ICD-10-CM | POA: Diagnosis present

## 2021-02-16 DIAGNOSIS — K219 Gastro-esophageal reflux disease without esophagitis: Secondary | ICD-10-CM | POA: Diagnosis present

## 2021-02-16 DIAGNOSIS — E860 Dehydration: Secondary | ICD-10-CM | POA: Diagnosis present

## 2021-02-16 DIAGNOSIS — R531 Weakness: Secondary | ICD-10-CM

## 2021-02-16 LAB — CBC
HCT: 33.2 % — ABNORMAL LOW (ref 36.0–46.0)
Hemoglobin: 11 g/dL — ABNORMAL LOW (ref 12.0–15.0)
MCH: 31.2 pg (ref 26.0–34.0)
MCHC: 33.1 g/dL (ref 30.0–36.0)
MCV: 94.1 fL (ref 80.0–100.0)
Platelets: 212 10*3/uL (ref 150–400)
RBC: 3.53 MIL/uL — ABNORMAL LOW (ref 3.87–5.11)
RDW: 14.1 % (ref 11.5–15.5)
WBC: 8.5 10*3/uL (ref 4.0–10.5)
nRBC: 0 % (ref 0.0–0.2)

## 2021-02-16 LAB — URINE CULTURE

## 2021-02-16 LAB — COMPREHENSIVE METABOLIC PANEL
ALT: 13 U/L (ref 0–44)
AST: 18 U/L (ref 15–41)
Albumin: 3.3 g/dL — ABNORMAL LOW (ref 3.5–5.0)
Alkaline Phosphatase: 49 U/L (ref 38–126)
Anion gap: 7 (ref 5–15)
BUN: 12 mg/dL (ref 8–23)
CO2: 28 mmol/L (ref 22–32)
Calcium: 8.4 mg/dL — ABNORMAL LOW (ref 8.9–10.3)
Chloride: 107 mmol/L (ref 98–111)
Creatinine, Ser: 0.78 mg/dL (ref 0.44–1.00)
GFR, Estimated: >60 mL/min (ref >60)
Glucose, Bld: 101 mg/dL — ABNORMAL HIGH (ref 70–99)
Potassium: 2.6 mmol/L — CL (ref 3.5–5.1)
Sodium: 142 mmol/L (ref 135–145)
Total Bilirubin: 0.7 mg/dL (ref 0.3–1.2)
Total Protein: 6 g/dL — ABNORMAL LOW (ref 6.5–8.1)

## 2021-02-16 LAB — RAPID URINE DRUG SCREEN, HOSP PERFORMED
Amphetamines: NOT DETECTED
Barbiturates: NOT DETECTED
Benzodiazepines: POSITIVE — AB
Cocaine: NOT DETECTED
Opiates: NOT DETECTED
Tetrahydrocannabinol: NOT DETECTED

## 2021-02-16 LAB — MAGNESIUM: Magnesium: 2.2 mg/dL (ref 1.7–2.4)

## 2021-02-16 MED ORDER — POTASSIUM CHLORIDE 10 MEQ/100ML IV SOLN
10.0000 meq | INTRAVENOUS | Status: AC
  Administered 2021-02-16 (×3): 10 meq via INTRAVENOUS
  Filled 2021-02-16 (×3): qty 100

## 2021-02-16 MED ORDER — FAMOTIDINE IN NACL 20-0.9 MG/50ML-% IV SOLN
20.0000 mg | Freq: Once | INTRAVENOUS | Status: AC
  Administered 2021-02-16: 20 mg via INTRAVENOUS
  Filled 2021-02-16: qty 50

## 2021-02-16 MED ORDER — POTASSIUM CHLORIDE 10 MEQ/100ML IV SOLN
10.0000 meq | INTRAVENOUS | Status: AC
  Filled 2021-02-16 (×2): qty 100

## 2021-02-16 NOTE — Evaluation (Signed)
Physical Therapy Evaluation-1x Patient Details Name: [name] MRN: [id number] DOB: [birth year] Today's Date: 02/16/2021   History of Present Illness  75 yo female admitted with hypokalemia, N/V. Hx of neuropathy, anxiety, fibromyalgia, chronic pain  Clinical Impression  On eval, pt was Supv-Min guard for mobility. She walked ~150 feet without a device. Mildly unsteady but no overt LOB. Pt did c/o L anterior rib area pain once lying back in bed-encouraged her to mention this to MD as well. Recommend daily ambulation in hallway with supervision. 1x eval. Will sign off.     Follow Up Recommendations No PT follow up;Supervision - Intermittent    Equipment Recommendations  None recommended by PT    Recommendations for Other Services       Precautions / Restrictions Precautions Precautions: Fall Restrictions Weight Bearing Restrictions: No      Mobility  Bed Mobility Overal bed mobility: Modified Independent                  Transfers Overall transfer level: Modified independent                  Ambulation/Gait Ambulation/Gait assistance: Min guard;Close Supervision Gait Distance (Feet): 175 Feet Assistive device: None Gait Pattern/deviations: Step-through pattern;Decreased stride length     General Gait Details: Min guard for safety. No overt LOB but pt is mildly unsteady.  Stairs            Wheelchair Mobility    Modified Rankin (Stroke Patients Only)       Balance Overall balance assessment: Mild deficits observed, not formally tested                                           Pertinent Vitals/Pain Pain Assessment: Faces Faces Pain Scale: Hurts little more Pain Location: L anterior ribs area Pain Descriptors / Indicators: Grimacing;Discomfort Pain Intervention(s): Monitored during session    Home Living Family/patient expects to be discharged to:: Private residence Living Arrangements: Alone Available Help at  Discharge: Family Type of Home: House         Home Equipment: Environmental consultant - 2 wheels;Cane - single point;Shower seat      Prior Function Level of Independence: Independent with assistive device(s)         Comments: uses RW PRN. No longer drives     Hand Dominance        Extremity/Trunk Assessment   Upper Extremity Assessment Upper Extremity Assessment: Overall WFL for tasks assessed    Lower Extremity Assessment Lower Extremity Assessment: Overall WFL for tasks assessed    Cervical / Trunk Assessment Cervical / Trunk Assessment: Normal  Communication   Communication: No difficulties  Cognition Arousal/Alertness: Awake/alert Behavior During Therapy: WFL for tasks assessed/performed Overall Cognitive Status: Within Functional Limits for tasks assessed                                        General Comments      Exercises     Assessment/Plan    PT Assessment Patent does not need any further PT services  PT Problem List         PT Treatment Interventions      PT Goals (Current goals can be found in the Care Plan section)  Acute Rehab PT Goals Patient  Stated Goal: to get better and get home PT Goal Formulation: All assessment and education complete, DC therapy    Frequency     Barriers to discharge        Co-evaluation               AM-PAC PT "6 Clicks" Mobility  Outcome Measure Help needed turning from your back to your side while in a flat bed without using bedrails?: None Help needed moving from lying on your back to sitting on the side of a flat bed without using bedrails?: None Help needed moving to and from a bed to a chair (including a wheelchair)?: None Help needed standing up from a chair using your arms (e.g., wheelchair or bedside chair)?: A Little Help needed to walk in hospital room?: A Little Help needed climbing 3-5 steps with a railing? : A Little 6 Click Score: 21    End of Session Equipment Utilized During  Treatment: Gait belt Activity Tolerance: Patient tolerated treatment well Patient left: in bed;with call bell/phone within reach;with bed alarm set   PT Visit Diagnosis: Unsteadiness on feet (R26.81)    Time: 2620-3559 PT Time Calculation (min) (ACUTE ONLY): 14 min   Charges:   PT Evaluation $PT Eval Low Complexity: 1 Low            Faye Ramsay, PT Acute Rehabilitation  Office: [phone] Pager: [phone]

## 2021-02-16 NOTE — Plan of Care (Signed)
Pt is alert and oriented X 4. VS within baseline. Pt is on continuous tele monitoring with no calls from tele. Her potassium this morning was 2.6, provider on call was notified, and initiated potassium replacement.  She denies chest pain or SOB. She verbalized that her insomnia was relieved with Ambien and had a restful night. Will continue to monitor    Problem: Education: Goal: Knowledge of General Education information will improve Description: Including pain rating scale, medication(s)/side effects and non-pharmacologic comfort measures Outcome: Progressing   Problem: Health Behavior/Discharge Planning: Goal: Ability to manage health-related needs will improve Outcome: Progressing   Problem: Clinical Measurements: Goal: Ability to maintain clinical measurements within normal limits will improve Outcome: Progressing Goal: Will remain free from infection Outcome: Progressing Goal: Diagnostic test results will improve Outcome: Progressing Goal: Respiratory complications will improve Outcome: Progressing Goal: Cardiovascular complication will be avoided Outcome: Progressing

## 2021-02-16 NOTE — Progress Notes (Signed)
[facility]  PROGRESS NOTE  [name] TWS:[id number] DOB: 1946/03/29 DOA: 02/15/2021 PCP: Avon Gully, MD   Brief HPI:   75 year old female with a history of generalized anxiety disorder, hyperlipidemia, peripheral neuropathy, GERD admitted to hospital on 02/15/2021 with hypokalemia after having poor p.o. intake and nausea at home.  Patient states that she was taking amitriptyline which was changed to Cymbalta by her psychiatrist 3 weeks ago and since that she had nausea and poor p.o. intake.   In the ED patient was found to have prolonged QTC on EKG and severe hypokalemia    Subjective   Patient seen and examined, denies chest pain or shortness of breath.  EKG this morning shows QTC 527.  Still has the potassium.  Potassium being replaced.   Assessment/Plan:    Severe hypokalemia -Potassium being replaced, serum magnesium was 1.9 -She also received 2 g of magnesium sulfate IV x1 -We will follow BMP in am  Intractable nausea/poor p.o. intake -Unclear etiology -Continue Protonix 40 mg p.o. twice daily -Likely from withdrawal from amitriptyline -Continue LR at 75 mill per hour  QTC prolongation -QTC was prolonged to 640 ms -Likely from severe hypokalemia as above -Potassium being replaced -Repeat EKG today shows QTC 527 ms -We will repeat EKG in a.m.  Amitriptyline withdrawal -Patient said that she has having symptoms of poor p.o. intake and nausea since amitriptyline withdrawal -Psychiatry was consulted for restarting amitriptyline; psych recommends not to restart amitriptyline at this time -We will continue to monitor.  Generalized weakness -Methylmalonic acid ordered -TSH 2.038 -Urine culture grew multiple species    Scheduled medications:    gabapentin  100 mg Oral BID   pantoprazole  40 mg Oral QAC breakfast   propranolol  40 mg Oral BID   simvastatin  40 mg Oral QHS         Data Reviewed:   CBG:  No results for input(s): GLUCAP in the  last 168 hours.  SpO2: 99 %    Vitals:   02/16/21 0015 02/16/21 0447 02/16/21 0845 02/16/21 1253  BP: 114/64 (!) 142/67 (!) 155/65 (!) 148/69  Pulse: 66 62 63 (!) 52  Resp: 16 16 17 17   Temp: 98 F (36.7 C) 98.2 F (36.8 C) 99.2 F (37.3 C) 99.4 F (37.4 C)  TempSrc: Oral Oral Oral Oral  SpO2: 99% 97% 99% 99%     Intake/Output Summary (Last 24 hours) at 02/16/2021 1629 Last data filed at 02/16/2021 1500 Gross per 24 hour  Intake 2066.51 ml  Output --  Net 2066.51 ml    No intake/output data recorded.  There were no vitals filed for this visit.  CBC:  Recent Labs  Lab 02/15/21 1555 02/16/21 0439  WBC 9.4 8.5  HGB 12.8 11.0*  HCT 38.4 33.2*  PLT 229 212  MCV 93.4 94.1  MCH 31.1 31.2  MCHC 33.3 33.1  RDW 13.8 14.1  LYMPHSABS 1.3  --   MONOABS 0.8  --   EOSABS 0.0  --   BASOSABS 0.1  --     Complete metabolic panel:  Recent Labs  Lab 02/15/21 1555 02/16/21 0439  NA 144 142  K 2.4* 2.6*  CL 106 107  CO2 28 28  GLUCOSE 124* 101*  BUN 14 12  CREATININE 0.98 0.78  CALCIUM 9.1 8.4*  AST 17 18  ALT 15 13  ALKPHOS 58 49  BILITOT 0.8 0.7  ALBUMIN 3.9 3.3*  MG 1.9 2.2  TSH 2.038  --  No results for input(s): LIPASE, AMYLASE in the last 168 hours.  Recent Labs  Lab 02/15/21 1950  SARSCOV2NAA NEGATIVE    ------------------------------------------------------------------------------------------------------------------ No results for input(s): CHOL, HDL, LDLCALC, TRIG, CHOLHDL, LDLDIRECT in the last 72 hours.  Lab Results  Component Value Date   HGBA1C 5.7 (H) 07/13/2011   ------------------------------------------------------------------------------------------------------------------ Recent Labs    02/15/21 1555  TSH 2.038   ------------------------------------------------------------------------------------------------------------------ No results for input(s): VITAMINB12, FOLATE, FERRITIN, TIBC, IRON, RETICCTPCT in the last 72  hours.  Coagulation profile No results for input(s): INR, PROTIME in the last 168 hours. No results for input(s): DDIMER in the last 72 hours.  Cardiac Enzymes No results for input(s): CKTOTAL, CKMB, CKMBINDEX, TROPONINI in the last 168 hours.  ------------------------------------------------------------------------------------------------------------------ No results found for: BNP   Antibiotics: Anti-infectives (From admission, onward)    None        Radiology Reports  No results found.    DVT prophylaxis: SCDs  Code Status: Full code  Family Communication: No family at bedside   Consultants:   Procedures:     Objective    Physical Examination:  General-appears in no acute distress Heart-S1-S2, regular, no murmur auscultated Lungs-clear to auscultation bilaterally, no wheezing or crackles auscultated Abdomen-soft, nontender, no organomegaly Extremities-no edema in the lower extremities Neuro-alert, oriented x3, no focal deficit noted  Status is: Inpatient  Dispo: The patient is from: Home              Anticipated d/c is to: Home              Anticipated d/c date is: 02/18/2021              Patient currently not stable for discharge  Barrier to discharge-severe hypokalemia  COVID-19 Labs  No results for input(s): DDIMER, FERRITIN, LDH, CRP in the last 72 hours.  Lab Results  Component Value Date   SARSCOV2NAA NEGATIVE 02/15/2021   SARSCOV2NAA NEGATIVE 05/27/2019    Microbiology  Recent Results (from the past 240 hour(s))  Urine Culture     Status: Abnormal   Collection Time: 02/15/21  8:59 AM   Specimen: Urine, Clean Catch  Result Value Ref Range Status   Specimen Description   Final    URINE, CLEAN CATCH Performed at [facility], [address], [place], [place] [address]    Special Requests   Final    NONE Performed at [facility], [address], [place], [place] [address]    Culture MULTIPLE SPECIES PRESENT, SUGGEST  RECOLLECTION (A)  Final   Report Status 02/16/2021 FINAL  Final  Resp Panel by RT-PCR (Flu A&B, Covid) Nasopharyngeal Swab     Status: None   Collection Time: 02/15/21  7:50 PM   Specimen: Nasopharyngeal Swab; Nasopharyngeal(NP) swabs in vial transport medium  Result Value Ref Range Status   SARS Coronavirus 2 by RT PCR NEGATIVE NEGATIVE Final    Comment: (NOTE) SARS-CoV-2 target nucleic acids are NOT DETECTED.  The SARS-CoV-2 RNA is generally detectable in upper respiratory specimens during the acute phase of infection. The lowest concentration of SARS-CoV-2 viral copies this assay can detect is 138 copies/mL. A negative result does not preclude SARS-Cov-2 infection and should not be used as the sole basis for treatment or other patient management decisions. A negative result may occur with  improper specimen collection/handling, submission of specimen other than nasopharyngeal swab, presence of viral mutation(s) within the areas targeted by this assay, and inadequate number of viral copies(<138 copies/mL). A negative result must be  combined with clinical observations, patient history, and epidemiological information. The expected result is Negative.  Fact Sheet for Patients:  BloggerCourse.com  Fact Sheet for Healthcare Providers:  SeriousBroker.it  This test is no t yet approved or cleared by the [place] FDA and  has been authorized for detection and/or diagnosis of SARS-CoV-2 by FDA under an Emergency Use Authorization (EUA). This EUA will remain  in effect (meaning this test can be used) for the duration of the COVID-19 declaration under Section 564(b)(1) of the Act, 21 U.S.C.section 360bbb-3(b)(1), unless the authorization is terminated  or revoked sooner.       Influenza A by PCR NEGATIVE NEGATIVE Final   Influenza B by PCR NEGATIVE NEGATIVE Final    Comment: (NOTE) The Xpert Xpress SARS-CoV-2/FLU/RSV plus assay  is intended as an aid in the diagnosis of influenza from Nasopharyngeal swab specimens and should not be used as a sole basis for treatment. Nasal washings and aspirates are unacceptable for Xpert Xpress SARS-CoV-2/FLU/RSV testing.  Fact Sheet for Patients: BloggerCourse.com  Fact Sheet for Healthcare Providers: SeriousBroker.it  This test is not yet approved or cleared by the [place] FDA and has been authorized for detection and/or diagnosis of SARS-CoV-2 by FDA under an Emergency Use Authorization (EUA). This EUA will remain in effect (meaning this test can be used) for the duration of the COVID-19 declaration under Section 564(b)(1) of the Act, 21 U.S.C. section 360bbb-3(b)(1), unless the authorization is terminated or revoked.  Performed at [facility], [address], [place], [place] [address]              Meredeth Ide   [facility] If 7PM-7AM, please contact night-coverage at [email or web address], Office  [phone]   02/16/2021, 4:29 PM  LOS: 1 day

## 2021-02-17 ENCOUNTER — Inpatient Hospital Stay (HOSPITAL_COMMUNITY): Payer: PPO

## 2021-02-17 LAB — LIPASE, BLOOD: Lipase: 37 U/L (ref 11–51)

## 2021-02-17 LAB — URINALYSIS, ROUTINE W REFLEX MICROSCOPIC
Bacteria, UA: NONE SEEN
Bilirubin Urine: NEGATIVE
Glucose, UA: NEGATIVE mg/dL
Ketones, ur: NEGATIVE mg/dL
Nitrite: NEGATIVE
Protein, ur: NEGATIVE mg/dL
RBC / HPF: >50 RBC/hpf — ABNORMAL HIGH (ref 0–5)
Specific Gravity, Urine: 1.01 (ref 1.005–1.030)
pH: 6.5 (ref 5.0–8.0)

## 2021-02-17 LAB — COMPREHENSIVE METABOLIC PANEL
ALT: 13 U/L (ref 0–44)
AST: 16 U/L (ref 15–41)
Albumin: 2.8 g/dL — ABNORMAL LOW (ref 3.5–5.0)
Alkaline Phosphatase: 42 U/L (ref 38–126)
Anion gap: 8 (ref 5–15)
BUN: 11 mg/dL (ref 8–23)
CO2: 26 mmol/L (ref 22–32)
Calcium: 8.3 mg/dL — ABNORMAL LOW (ref 8.9–10.3)
Chloride: 108 mmol/L (ref 98–111)
Creatinine, Ser: 0.91 mg/dL (ref 0.44–1.00)
GFR, Estimated: >60 mL/min (ref >60)
Glucose, Bld: 109 mg/dL — ABNORMAL HIGH (ref 70–99)
Potassium: 2.6 mmol/L — CL (ref 3.5–5.1)
Sodium: 142 mmol/L (ref 135–145)
Total Bilirubin: 0.4 mg/dL (ref 0.3–1.2)
Total Protein: 5.5 g/dL — ABNORMAL LOW (ref 6.5–8.1)

## 2021-02-17 MED ORDER — BUSPIRONE HCL 10 MG PO TABS
10.0000 mg | ORAL_TABLET | Freq: Three times a day (TID) | ORAL | Status: DC
  Administered 2021-02-17: 10 mg via ORAL
  Filled 2021-02-17: qty 1

## 2021-02-17 MED ORDER — FLUOXETINE HCL 10 MG PO CAPS
10.0000 mg | ORAL_CAPSULE | Freq: Every day | ORAL | Status: DC
  Administered 2021-02-17 – 2021-02-20 (×4): 10 mg via ORAL
  Filled 2021-02-17 (×4): qty 1

## 2021-02-17 MED ORDER — POTASSIUM CHLORIDE CRYS ER 10 MEQ PO TBCR
40.0000 meq | EXTENDED_RELEASE_TABLET | ORAL | Status: AC
  Administered 2021-02-17 (×4): 40 meq via ORAL
  Filled 2021-02-17 (×4): qty 4

## 2021-02-17 MED ORDER — HYDROXYZINE HCL 25 MG PO TABS
25.0000 mg | ORAL_TABLET | Freq: Three times a day (TID) | ORAL | Status: DC
  Administered 2021-02-17 – 2021-02-20 (×9): 25 mg via ORAL
  Filled 2021-02-17 (×9): qty 1

## 2021-02-17 MED ORDER — POTASSIUM CHLORIDE 10 MEQ/100ML IV SOLN
10.0000 meq | INTRAVENOUS | Status: DC
  Administered 2021-02-17: 10 meq via INTRAVENOUS
  Filled 2021-02-17 (×2): qty 100

## 2021-02-17 NOTE — Consult Note (Signed)
  Psych consult placed for appropriate medication management for anxiety. Patient recently stopped Elavil 50mg  po daily after 5 years, and was started on cymbalta. She reports not taking either of the medications at this time, since 4 weeks. She is interested in something short term to help with her ongoing acute symptoms.   Will start hydroxyzine 25mg  po TID for anxiety. We have also ordered daily serial EKGs to monitor QTc. Will benefit from outpatient geropsych referrals.   -Fluoxetine 10mg  po daily. Hydroxyzine 25mg  po TID for anxiety -Outpatient referral geriatric psychiatrist.  -Serial EKGS x 3.

## 2021-02-17 NOTE — Progress Notes (Addendum)
[facility]  PROGRESS NOTE  [name] [id number]:[id number] DOB: 08/24/1945 DOA: 02/15/2021 PCP: Avon Gully, MD   Brief HPI:   75 year old female with a history of generalized anxiety disorder, hyperlipidemia, peripheral neuropathy, GERD admitted to hospital on 02/15/2021 with hypokalemia after having poor p.o. intake and nausea at home.  Patient states that she was taking amitriptyline which was changed to Cymbalta by her psychiatrist 3 weeks ago and since that she had nausea and poor p.o. intake.   In the ED patient was found to have prolonged QTC on EKG and severe hypokalemia    Subjective   Patient seen and examined, very anxious this morning.  Potassium still low.   Assessment/Plan:    Severe hypokalemia -Presents with potassium of 2.6, received 7 rounds of IV KCl 10 mEq yesterday -Potassium is still low 2.6 this morning -Her magnesium was 1.9, she received 2 g of IV mag sulfate x1 -We will start K-Dur 40 mEq p.o. every 4 hours x4 doses -Follow BMP in am  Hematuria -Patient developed hematuria -Urine culture from 02/15/2021 grew multiple species -UA shows more than 50 RBCs per high-power field -We will obtain CT stone protocol  Anxiety -Patient was taken off amitriptyline by her psychiatrist and started on duloxetine -patient states that she could not tolerate duloxetine -Patient seen by psychiatry -Started on hydroxyzine 25 mg 3 times daily for anxiety -Fluoxetine 10 mg daily  Intractable nausea/poor p.o. intake -Unclear etiology -Continue Protonix 40 mg p.o. twice daily -Likely from withdrawal from amitriptyline -Continue LR at 75 mill per hour  QTC prolongation -QTC was prolonged to 640 ms -Likely from severe hypokalemia as above -Potassium being replaced -Repeat EKG on 02/16/2021 showed QTC 527 ms -We will repeat EKG in a.m.   Generalized weakness -Methylmalonic acid ordered; result pending -TSH 2.038 -Urine culture grew multiple  species    Scheduled medications:    busPIRone  10 mg Oral TID   FLUoxetine  10 mg Oral Daily   gabapentin  100 mg Oral BID   hydrOXYzine  25 mg Oral TID   pantoprazole  40 mg Oral QAC breakfast   potassium chloride  40 mEq Oral Q4H   propranolol  40 mg Oral BID   simvastatin  40 mg Oral QHS    For long time veteran long time  Data Reviewed:   CBG:  No results for input(s): GLUCAP in the last 168 hours.  SpO2: 97 %    Vitals:   02/16/21 2155 02/17/21 0500 02/17/21 0502 02/17/21 1322  BP: (!) 159/72  122/65 (!) 138/59  Pulse: (!) 56  72 65  Resp:   18   Temp:   97.7 F (36.5 C) 98 F (36.7 C)  TempSrc:   Axillary Oral  SpO2: 99%  96% 97%  Weight: 62 kg 62.6 kg    Height: 5\' 3"  (1.6 m)        Intake/Output Summary (Last 24 hours) at 02/17/2021 1516 Last data filed at 02/17/2021 1500 Gross per 24 hour  Intake 1198.29 ml  Output 1600 ml  Net -401.71 ml    09/04 1901 - 09/06 0700 In: 2546.5 [P.O.:952; I.V.:1228.1] Out: 1000 [Urine:1000]  Filed Weights   02/16/21 2008 02/16/21 2155 02/17/21 0500  Weight: 62.6 kg 62 kg 62.6 kg    CBC:  Recent Labs  Lab 02/15/21 1555 02/16/21 0439  WBC 9.4 8.5  HGB 12.8 11.0*  HCT 38.4 33.2*  PLT 229 212  MCV 93.4 94.1  MCH 31.1 31.2  MCHC 33.3 33.1  RDW 13.8 14.1  LYMPHSABS 1.3  --   MONOABS 0.8  --   EOSABS 0.0  --   BASOSABS 0.1  --     Complete metabolic panel:  Recent Labs  Lab 02/15/21 1555 02/16/21 0439 02/17/21 0429  NA 144 142 142  K 2.4* 2.6* 2.6*  CL 106 107 108  CO2 28 28 26   GLUCOSE 124* 101* 109*  BUN 14 12 11   CREATININE 0.98 0.78 0.91  CALCIUM 9.1 8.4* 8.3*  AST 17 18 16   ALT 15 13 13   ALKPHOS 58 49 42  BILITOT 0.8 0.7 0.4  ALBUMIN 3.9 3.3* 2.8*  MG 1.9 2.2  --   TSH 2.038  --   --     Recent Labs  Lab 02/17/21 0429  LIPASE 37    Recent Labs  Lab 02/15/21 1950  SARSCOV2NAA NEGATIVE     ------------------------------------------------------------------------------------------------------------------ No results for input(s): CHOL, HDL, LDLCALC, TRIG, CHOLHDL, LDLDIRECT in the last 72 hours.  Lab Results  Component Value Date   HGBA1C 5.7 (H) 07/13/2011   ------------------------------------------------------------------------------------------------------------------ Recent Labs    02/15/21 1555  TSH 2.038   ------------------------------------------------------------------------------------------------------------------ No results for input(s): VITAMINB12, FOLATE, FERRITIN, TIBC, IRON, RETICCTPCT in the last 72 hours.  Coagulation profile No results for input(s): INR, PROTIME in the last 168 hours. No results for input(s): DDIMER in the last 72 hours.  Cardiac Enzymes No results for input(s): CKTOTAL, CKMB, CKMBINDEX, TROPONINI in the last 168 hours.  ------------------------------------------------------------------------------------------------------------------ No results found for: BNP   Antibiotics: Anti-infectives (From admission, onward)    None        Radiology Reports  No results found.    DVT prophylaxis: SCDs  Code Status: Full code  Family Communication: No family at bedside   Consultants:   Procedures:     Objective    Physical Examination:  General-appears in no acute distress Heart-S1-S2, regular, no murmur auscultated Lungs-clear to auscultation bilaterally, no wheezing or crackles auscultated Abdomen-soft, nontender, no organomegaly Extremities-no edema in the lower extremities Neuro-alert, oriented x3, no focal deficit noted  Status is: Inpatient  Dispo: The patient is from: Home              Anticipated d/c is to: Home              Anticipated d/c date is: 02/19/2021              Patient currently not stable for discharge  Barrier to discharge-severe hypokalemia  COVID-19 Labs  No results for  input(s): DDIMER, FERRITIN, LDH, CRP in the last 72 hours.  Lab Results  Component Value Date   SARSCOV2NAA NEGATIVE 02/15/2021   SARSCOV2NAA NEGATIVE 05/27/2019    Microbiology  Recent Results (from the past 240 hour(s))  Urine Culture     Status: Abnormal   Collection Time: 02/15/21  8:59 AM   Specimen: Urine, Clean Catch  Result Value Ref Range Status   Specimen Description   Final    URINE, CLEAN CATCH Performed at [facility], [address], [place], 04/17/21 [facility]    Special Requests   Final    NONE Performed at [facility], [address], [place], [address] [facility]    Culture MULTIPLE SPECIES PRESENT, SUGGEST RECOLLECTION (A)  Final   Report Status 02/16/2021 FINAL  Final  Resp Panel by RT-PCR (Flu A&B, Covid) Nasopharyngeal Swab     Status: None   Collection Time: 02/15/21  7:50 PM   Specimen:  Nasopharyngeal Swab; Nasopharyngeal(NP) swabs in vial transport medium  Result Value Ref Range Status   SARS Coronavirus 2 by RT PCR NEGATIVE NEGATIVE Final    Comment: (NOTE) SARS-CoV-2 target nucleic acids are NOT DETECTED.  The SARS-CoV-2 RNA is generally detectable in upper respiratory specimens during the acute phase of infection. The lowest concentration of SARS-CoV-2 viral copies this assay can detect is 138 copies/mL. A negative result does not preclude SARS-Cov-2 infection and should not be used as the sole basis for treatment or other patient management decisions. A negative result may occur with  improper specimen collection/handling, submission of specimen other than nasopharyngeal swab, presence of viral mutation(s) within the areas targeted by this assay, and inadequate number of viral copies(<138 copies/mL). A negative result must be combined with clinical observations, patient history, and epidemiological information. The expected result is Negative.  Fact Sheet for Patients:  BloggerCourse.com  Fact Sheet for Healthcare  Providers:  SeriousBroker.it  This test is no t yet approved or cleared by the [place] FDA and  has been authorized for detection and/or diagnosis of SARS-CoV-2 by FDA under an Emergency Use Authorization (EUA). This EUA will remain  in effect (meaning this test can be used) for the duration of the COVID-19 declaration under Section 564(b)(1) of the Act, 21 U.S.C.section 360bbb-3(b)(1), unless the authorization is terminated  or revoked sooner.       Influenza A by PCR NEGATIVE NEGATIVE Final   Influenza B by PCR NEGATIVE NEGATIVE Final    Comment: (NOTE) The Xpert Xpress SARS-CoV-2/FLU/RSV plus assay is intended as an aid in the diagnosis of influenza from Nasopharyngeal swab specimens and should not be used as a sole basis for treatment. Nasal washings and aspirates are unacceptable for Xpert Xpress SARS-CoV-2/FLU/RSV testing.  Fact Sheet for Patients: BloggerCourse.com  Fact Sheet for Healthcare Providers: SeriousBroker.it  This test is not yet approved or cleared by the [place] FDA and has been authorized for detection and/or diagnosis of SARS-CoV-2 by FDA under an Emergency Use Authorization (EUA). This EUA will remain in effect (meaning this test can be used) for the duration of the COVID-19 declaration under Section 564(b)(1) of the Act, 21 U.S.C. section 360bbb-3(b)(1), unless the authorization is terminated or revoked.  Performed at [facility] [address] [place], [place] [address]              Meredeth Ide   [facility] If 7PM-7AM, please contact night-coverage at [email or web address], Office  [phone]   02/17/2021, 3:16 PM  LOS: 2 days

## 2021-02-17 NOTE — Progress Notes (Signed)
    OVERNIGHT PROGRESS REPORT  UA reordered due to return value of contaminated specimen and continued symptoms.  Chinita Greenland MSNA MSN ACNPC-AG Acute Care Nurse Practitioner [facility]

## 2021-02-17 NOTE — Progress Notes (Signed)
Mobility Specialist - Progress Note    02/17/21 1533  Mobility  Activity Ambulated in hall  Level of Assistance Contact guard assist, steadying assist  Assistive Device None  Distance Ambulated (ft) 100 ft  Mobility Ambulated with assistance in hallway  Mobility Response Tolerated well  Mobility performed by Mobility specialist  $Mobility charge 1 Mobility    Upon entry pt was agreeable to ambulate in hallway and required no assistive device to walk ~100 ft. Pt required 3 standing rest breaks during session and had a sudden onset of dizziness and generalized weakness which required pt to sit immediately. Pt stated not wanting to get weaker and feeling "like a failure" due to mobility performance. Pt was rolled back to room using chair and returned to bed after session. Encouragement was provided to help pt feel better. Pt left in room with call bell at side and NT in room.   Arliss Journey Mobility Specialist Acute Rehabilitation Services Phone: [phone] 02/17/21, 3:36 PM

## 2021-02-18 LAB — BASIC METABOLIC PANEL
Anion gap: 6 (ref 5–15)
BUN: 10 mg/dL (ref 8–23)
CO2: 21 mmol/L — ABNORMAL LOW (ref 22–32)
Calcium: 8.7 mg/dL — ABNORMAL LOW (ref 8.9–10.3)
Chloride: 117 mmol/L — ABNORMAL HIGH (ref 98–111)
Creatinine, Ser: 0.85 mg/dL (ref 0.44–1.00)
GFR, Estimated: >60 mL/min (ref >60)
Glucose, Bld: 100 mg/dL — ABNORMAL HIGH (ref 70–99)
Potassium: 4 mmol/L (ref 3.5–5.1)
Sodium: 144 mmol/L (ref 135–145)

## 2021-02-18 MED ORDER — METOCLOPRAMIDE HCL 5 MG/ML IJ SOLN
5.0000 mg | Freq: Once | INTRAMUSCULAR | Status: AC
  Administered 2021-02-18: 5 mg via INTRAVENOUS
  Filled 2021-02-18: qty 2

## 2021-02-18 MED ORDER — METHOCARBAMOL 500 MG PO TABS
500.0000 mg | ORAL_TABLET | Freq: Once | ORAL | Status: AC
  Administered 2021-02-18: 500 mg via ORAL
  Filled 2021-02-18: qty 1

## 2021-02-18 MED ORDER — IBUPROFEN 200 MG PO TABS
600.0000 mg | ORAL_TABLET | Freq: Four times a day (QID) | ORAL | Status: DC | PRN
  Administered 2021-02-18 – 2021-02-19 (×2): 600 mg via ORAL
  Filled 2021-02-18 (×3): qty 3

## 2021-02-18 MED ORDER — DIAZEPAM 5 MG/ML IJ SOLN
2.5000 mg | Freq: Once | INTRAMUSCULAR | Status: AC
  Administered 2021-02-18: 2.5 mg via INTRAVENOUS
  Filled 2021-02-18: qty 2

## 2021-02-18 MED ORDER — METOCLOPRAMIDE HCL 5 MG/ML IJ SOLN
5.0000 mg | Freq: Four times a day (QID) | INTRAMUSCULAR | Status: DC | PRN
  Administered 2021-02-19: 5 mg via INTRAVENOUS
  Filled 2021-02-18: qty 2

## 2021-02-18 MED ORDER — GABAPENTIN 100 MG PO CAPS
200.0000 mg | ORAL_CAPSULE | Freq: Two times a day (BID) | ORAL | Status: DC
  Administered 2021-02-18 – 2021-02-20 (×4): 200 mg via ORAL
  Filled 2021-02-18 (×4): qty 2

## 2021-02-18 NOTE — Progress Notes (Signed)
Progress Note    [name]  ZOX:[id number] DOB: 07/04/1945  DOA: 02/15/2021 PCP: Avon GullyFanta, Tesfaye, MD    Brief Narrative:     Medical records reviewed and are as summarized below:  [name] is an 75 y.o. female with medical history significant for generalized anxiety disorder, hyperlipidemia, peripheral neuropathy, GERD, who is admitted to [facility] on 02/15/2021 with hypokalemia after [facility] complaining of nausea vomiting.   Assessment/Plan:   Principal Problem:   Hypokalemia Active Problems:   Anxiety   Nausea & vomiting   Dehydration   Prolonged QT interval   Generalized weakness   Neuropathy   GERD (gastroesophageal reflux disease)   Severe hypokalemia -replete agressively   Hematuria -Patient developed hematuria -Urine culture from 02/15/2021 grew multiple species -UA shows more than 50 RBCs per high-power field -outpatient urology follow up   Anxiety -Patient was taken off amitriptyline by her psychiatrist and started on duloxetine -patient states that she could not tolerate duloxetine -Patient seen by psychiatry -Started on hydroxyzine 25 mg 3 times daily for anxiety -Fluoxetine 10 mg daily -? If needs a taper of valium-- suspect she was taking more than prescribed at times but not recently   Intractable nausea/poor p.o. intake -Unclear etiology -Continue Protonix 40 mg p.o. twice daily -? withdrawal from amitriptyline -PRN reglan   QTC prolongation -QTC was prolonged to 640 ms -Likely from severe hypokalemia as above -Potassium being replaced -resolved -monitor PRN   Generalized weakness -Methylmalonic acid ordered; result pending -TSH 2.038 -Urine culture grew multiple species         Family Communication/Anticipated D/C date and plan/Code Status   DVT prophylaxis: scd Code Status: Full Code.  Disposition Plan: Status is: Inpatient  Remains inpatient appropriate because:Inpatient level of  care appropriate due to severity of illness  Dispo: The patient is from: Home              Anticipated d/c is to: Home              Patient currently is not medically stable to d/c.   Difficult to place patient No         Medical Consultants:   psych     Subjective:   Very anxious- crying  Objective:    Vitals:   02/17/21 2019 02/18/21 0115 02/18/21 0500 02/18/21 1249  BP: (!) 143/83  (!) 158/55 (!) 152/72  Pulse: 63  63 (!) 57  Resp: 20  18 20   Temp: 97.8 F (36.6 C)  98.6 F (37 C) 98.1 F (36.7 C)  TempSrc: Axillary  Axillary Oral  SpO2: 97%  97% 98%  Weight: 63.6 kg 63.6 kg    Height:        Intake/Output Summary (Last 24 hours) at 02/18/2021 1448 Last data filed at 02/18/2021 [id number] Gross per 24 hour  Intake 838.29 ml  Output 1600 ml  Net -761.71 ml   Filed Weights   02/17/21 0500 02/17/21 2019 02/18/21 0115  Weight: 62.6 kg 63.6 kg 63.6 kg    Exam:  General: Appearance:    Well developed, well nourished female in no acute distress     Lungs:     respirations unlabored  Heart:    Bradycardic.   MS:   All extremities are intact.    Neurologic:   Awake, alert, tremulous     Data Reviewed:   I have personally reviewed following labs and imaging studies:  Labs: Labs  show the following:   Basic Metabolic Panel: Recent Labs  Lab 02/15/21 1555 02/16/21 0439 02/17/21 0429 02/18/21 0457  NA 144 142 142 144  K 2.4* 2.6* 2.6* 4.0  CL 106 107 108 117*  CO2 28 28 26  21*  GLUCOSE 124* 101* 109* 100*  BUN 14 12 11 10   CREATININE 0.98 0.78 0.91 0.85  CALCIUM 9.1 8.4* 8.3* 8.7*  MG 1.9 2.2  --   --    GFR Estimated Creatinine Clearance: 51.4 mL/min (by C-G formula based on SCr of 0.85 mg/dL). Liver Function Tests: Recent Labs  Lab 02/15/21 1555 02/16/21 0439 02/17/21 0429  AST 17 18 16   ALT 15 13 13   ALKPHOS 58 49 42  BILITOT 0.8 0.7 0.4  PROT 7.2 6.0* 5.5*  ALBUMIN 3.9 3.3* 2.8*   Recent Labs  Lab 02/17/21 0429  LIPASE 37    No results for input(s): AMMONIA in the last 168 hours. Coagulation profile No results for input(s): INR, PROTIME in the last 168 hours.  CBC: Recent Labs  Lab 02/15/21 1555 02/16/21 0439  WBC 9.4 8.5  NEUTROABS 7.1  --   HGB 12.8 11.0*  HCT 38.4 33.2*  MCV 93.4 94.1  PLT 229 212   Cardiac Enzymes: No results for input(s): CKTOTAL, CKMB, CKMBINDEX, TROPONINI in the last 168 hours. BNP (last 3 results) No results for input(s): PROBNP in the last 8760 hours. CBG: No results for input(s): GLUCAP in the last 168 hours. D-Dimer: No results for input(s): DDIMER in the last 72 hours. Hgb A1c: No results for input(s): HGBA1C in the last 72 hours. Lipid Profile: No results for input(s): CHOL, HDL, LDLCALC, TRIG, CHOLHDL, LDLDIRECT in the last 72 hours. Thyroid function studies: Recent Labs    02/15/21 1555  TSH 2.038   Anemia work up: No results for input(s): VITAMINB12, FOLATE, FERRITIN, TIBC, IRON, RETICCTPCT in the last 72 hours. Sepsis Labs: Recent Labs  Lab 02/15/21 1555 02/16/21 0439  WBC 9.4 8.5    Microbiology Recent Results (from the past 240 hour(s))  Urine Culture     Status: Abnormal   Collection Time: 02/15/21  8:59 AM   Specimen: Urine, Clean Catch  Result Value Ref Range Status   Specimen Description   Final    URINE, CLEAN CATCH Performed at [facility], [address], [place], 04/17/21 [facility]    Special Requests   Final    NONE Performed at [facility], [address] [place], [address] [facility]    Culture MULTIPLE SPECIES PRESENT, SUGGEST RECOLLECTION (A)  Final   Report Status 02/16/2021 FINAL  Final  Resp Panel by RT-PCR (Flu A&B, Covid) Nasopharyngeal Swab     Status: None   Collection Time: 02/15/21  7:50 PM   Specimen: Nasopharyngeal Swab; Nasopharyngeal(NP) swabs in vial transport medium  Result Value Ref Range Status   SARS Coronavirus 2 by RT PCR NEGATIVE NEGATIVE Final    Comment: (NOTE) SARS-CoV-2 target nucleic acids are NOT  DETECTED.  The SARS-CoV-2 RNA is generally detectable in upper respiratory specimens during the acute phase of infection. The lowest concentration of SARS-CoV-2 viral copies this assay can detect is 138 copies/mL. A negative result does not preclude SARS-Cov-2 infection and should not be used as the sole basis for treatment or other patient management decisions. A negative result may occur with  improper specimen collection/handling, submission of specimen other than nasopharyngeal swab, presence of viral mutation(s) within the areas targeted by this assay, and inadequate number of viral  copies(<138 copies/mL). A negative result must be combined with clinical observations, patient history, and epidemiological information. The expected result is Negative.  Fact Sheet for Patients:  BloggerCourse.com  Fact Sheet for Healthcare Providers:  SeriousBroker.it  This test is no t yet approved or cleared by the [place] FDA and  has been authorized for detection and/or diagnosis of SARS-CoV-2 by FDA under an Emergency Use Authorization (EUA). This EUA will remain  in effect (meaning this test can be used) for the duration of the COVID-19 declaration under Section 564(b)(1) of the Act, 21 U.S.C.section 360bbb-3(b)(1), unless the authorization is terminated  or revoked sooner.       Influenza A by PCR NEGATIVE NEGATIVE Final   Influenza B by PCR NEGATIVE NEGATIVE Final    Comment: (NOTE) The Xpert Xpress SARS-CoV-2/FLU/RSV plus assay is intended as an aid in the diagnosis of influenza from Nasopharyngeal swab specimens and should not be used as a sole basis for treatment. Nasal washings and aspirates are unacceptable for Xpert Xpress SARS-CoV-2/FLU/RSV testing.  Fact Sheet for Patients: BloggerCourse.com  Fact Sheet for Healthcare Providers: SeriousBroker.it  This test is not yet  approved or cleared by the [place] FDA and has been authorized for detection and/or diagnosis of SARS-CoV-2 by FDA under an Emergency Use Authorization (EUA). This EUA will remain in effect (meaning this test can be used) for the duration of the COVID-19 declaration under Section 564(b)(1) of the Act, 21 U.S.C. section 360bbb-3(b)(1), unless the authorization is terminated or revoked.  Performed at [facility], [address] [place], [place] [address]     Procedures and diagnostic studies:  CT RENAL STONE STUDY  Result Date: 02/17/2021 CLINICAL DATA:  Hematuria with loss of appetite and nausea. EXAM: CT ABDOMEN AND PELVIS WITHOUT CONTRAST TECHNIQUE: Multidetector CT imaging of the abdomen and pelvis was performed following the standard protocol without IV contrast. COMPARISON:  Chest CT May 27, 2019 and CT April 01, 2012 FINDINGS: Lower chest: Motion degraded examination of the lung bases. Partially visualized hyperdensity in the left breast reflects inferior margin of the peripherally calcified left breast prosthesis seen on chest CT May 27, 2019. Hepatobiliary: Unremarkable noncontrast appearance of the hepatic parenchyma. Gallbladder surgically absent. Mild prominence of the biliary tree likely reflecting reservoir effect post cholecystectomy Pancreas: Within normal limits. Spleen: Within normal limits. Adrenals/Urinary Tract: Bilateral adrenal glands are unremarkable. No hydronephrosis. No renal, ureteral or bladder calculi visualized. Exophytic 6.2 cm right renal cyst and 3.4 cm left renal cyst. Urinary bladder is unremarkable for degree of distension. Stomach/Bowel: Ingested tablets in the stomach. No pathologic dilation of small or large bowel. The appendix and terminal ileum appear normal. No evidence of acute bowel inflammation. Vascular/Lymphatic: Aortic and branch vessel atherosclerosis without abdominal aortic aneurysm. No pathologically enlarged  abdominal or pelvic lymph nodes visualized. Reproductive: Status post hysterectomy. No adnexal masses. Other: No significant abdominal or pelvic ascites. No pneumoperitoneum. No portal venous gas. Postsurgical changes in the anterior abdominal wall. Musculoskeletal: Diffuse demineralization of bone. Lumbar spondylosis. No acute osseous abnormality. IMPRESSION: 1. No acute findings in the abdomen or pelvis. Specifically no evidence of obstructive uropathy. 2. Bilateral renal cysts. 3.  Aortic Atherosclerosis (ICD10-I70.0). Electronically Signed   By: Maudry Mayhew M.D.   On: 02/17/2021 19:43    Medications:    FLUoxetine  10 mg Oral Daily   gabapentin  200 mg Oral BID   hydrOXYzine  25 mg Oral TID   pantoprazole  40 mg Oral QAC breakfast  propranolol  40 mg Oral BID   simvastatin  40 mg Oral QHS   Continuous Infusions:   LOS: 3 days   Joseph Art  [facility]   How to contact the [facility] Attending or Consulting provider 7A - 7P or covering provider during after hours 7P -7A, for this patient?  Check the care team in [facility] and look for a) attending/consulting [facility] provider listed and b) the [facility] team listed Log into [email or web address] and use 's universal password to access. If you do not have the password, please contact the hospital operator. Locate the [facility] provider you are looking for under [facility] and page to a number that you can be directly reached. If you still have difficulty reaching the provider, please page the [facility] (Director on Call) for the Hospitalists listed on amion for assistance.  02/18/2021, 2:48 PM

## 2021-02-18 NOTE — Progress Notes (Signed)
   02/18/21 1200  Mobility  Activity Refused mobility   Pt mobilized with NT prior to my arrival, RN administering meds. Hold mobility until 9/8.

## 2021-02-18 NOTE — Progress Notes (Signed)
Wasted 1.5 mL out of 2 mL of Valium with Gerri Spore, RN.

## 2021-02-18 NOTE — Care Management Important Message (Signed)
Important Message  Patient Details IM Letter given to the Patient. Name: CHARICE ZUNO MRN: [id number] Date of Birth: 1945/11/15   Medicare Important Message Given:  Yes     Caren Macadam 02/18/2021, 12:06 PM

## 2021-02-18 NOTE — Progress Notes (Signed)
3rd EKG not performed as pt with increase anxiety this AM.

## 2021-02-19 ENCOUNTER — Other Ambulatory Visit: Payer: Self-pay

## 2021-02-19 LAB — METHYLMALONIC ACID, SERUM: Methylmalonic Acid, Quantitative: 162 nmol/L (ref 0–378)

## 2021-02-19 MED ORDER — ACETAMINOPHEN 650 MG RE SUPP: 650.0000 mg | Freq: Four times a day (QID) | RECTAL | Status: DC | PRN

## 2021-02-19 MED ORDER — KETOROLAC TROMETHAMINE 15 MG/ML IJ SOLN
15.0000 mg | Freq: Once | INTRAMUSCULAR | Status: AC
  Administered 2021-02-19: 15 mg via INTRAVENOUS
  Filled 2021-02-19: qty 1

## 2021-02-19 MED ORDER — ACETAMINOPHEN 500 MG PO TABS: 1000.0000 mg | ORAL_TABLET | Freq: Four times a day (QID) | ORAL | Status: DC | PRN

## 2021-02-19 MED ORDER — METHOCARBAMOL 500 MG PO TABS
500.0000 mg | ORAL_TABLET | Freq: Three times a day (TID) | ORAL | Status: DC | PRN
  Administered 2021-02-19 – 2021-02-20 (×3): 500 mg via ORAL
  Filled 2021-02-19 (×3): qty 1

## 2021-02-19 MED ORDER — PHENAZOPYRIDINE HCL 100 MG PO TABS: 100.0000 mg | ORAL_TABLET | Freq: Three times a day (TID) | ORAL | Status: DC

## 2021-02-19 MED ORDER — PHENAZOPYRIDINE HCL 100 MG PO TABS
100.0000 mg | ORAL_TABLET | Freq: Once | ORAL | Status: AC
  Administered 2021-02-19: 100 mg via ORAL
  Filled 2021-02-19: qty 1

## 2021-02-19 MED ORDER — PROPRANOLOL HCL 20 MG PO TABS
20.0000 mg | ORAL_TABLET | Freq: Two times a day (BID) | ORAL | Status: DC
  Administered 2021-02-19: 20 mg via ORAL
  Filled 2021-02-19: qty 1

## 2021-02-19 NOTE — Progress Notes (Signed)
Dr Benjamine Mola aware pt c/o severe HA not relieved by tylenol. See new order entered per MD.

## 2021-02-19 NOTE — Progress Notes (Signed)
Progress Note    [name]  XTK:[id number] DOB: 12/10/1945  DOA: 02/15/2021 PCP: Avon Gully, MD    Brief Narrative:     Medical records reviewed and are as summarized below:  [name] is an 75 y.o. female with medical history significant for generalized anxiety disorder, hyperlipidemia, peripheral neuropathy, GERD, who is admitted to [facility] on 02/15/2021 with hypokalemia after presenting from home to [facility] ED complaining of nausea/vomiting.    Assessment/Plan:   Principal Problem:   Hypokalemia Active Problems:   Anxiety   Nausea & vomiting   Dehydration   Prolonged QT interval   Generalized weakness   Neuropathy   GERD (gastroesophageal reflux disease)   Severe hypokalemia -replete agressively   Hematuria -Patient developed hematuria -Urine culture from 02/15/2021 grew multiple species-repeat culture pending -UA shows more than 50 RBCs per high-power field -outpatient urology follow up   Anxiety -Patient was taken off amitriptyline by her PCP and started on duloxetine -patient states that she could not tolerate duloxetine -Patient seen by psychiatry -Started on hydroxyzine 25 mg 3 times daily for anxiety -Fluoxetine 10 mg daily -seems improved today   Intractable nausea/poor p.o. intake -resolved   QTC prolongation -QTC was prolonged to 640 ms -Likely from severe hypokalemia as above -Potassium being replaced -resolved -monitor PRN   Generalized weakness -improved Ambulate q shift         Family Communication/Anticipated D/C date and plan/Code Status   DVT prophylaxis: scd Code Status: Full Code.  Disposition Plan: Status is: Inpatient  Remains inpatient appropriate because:Inpatient level of care appropriate due to severity of illness  Dispo: The patient is from: Home              Anticipated d/c is to: Home              Patient currently is not medically stable to d/c. In AM?   Difficult to place patient  No         Medical Consultants:   psych     Subjective:   Much improved this AM  Objective:    Vitals:   02/18/21 1249 02/18/21 2036 02/19/21 0302 02/19/21 0501  BP: (!) 152/72 (!) 155/54  (!) 131/52  Pulse: (!) 57 65  67  Resp: 20 18  18   Temp: 98.1 F (36.7 C) 98.8 F (37.1 C)  97.7 F (36.5 C)  TempSrc: Oral Oral  Axillary  SpO2: 98% 98%  97%  Weight:  62.1 kg 62.1 kg   Height:       No intake or output data in the 24 hours ending 02/19/21 1346  Filed Weights   02/18/21 0115 02/18/21 2036 02/19/21 0302  Weight: 63.6 kg 62.1 kg 62.1 kg    Exam:  General: Appearance:    Well developed, well nourished female in no acute distress     Lungs:      respirations unlabored  Heart:    Normal heart rate.    MS:   All extremities are intact.    Neurologic:   Awake, alert, oriented x 3. No apparent focal neurological           defect.        Data Reviewed:   I have personally reviewed following labs and imaging studies:  Labs: Labs show the following:   Basic Metabolic Panel: Recent Labs  Lab 02/15/21 1555 02/16/21 0439 02/17/21 0429 02/18/21 0457  NA 144 142 142 144  K 2.4*  2.6* 2.6* 4.0  CL 106 107 108 117*  CO2 28 28 26  21*  GLUCOSE 124* 101* 109* 100*  BUN 14 12 11 10   CREATININE 0.98 0.78 0.91 0.85  CALCIUM 9.1 8.4* 8.3* 8.7*  MG 1.9 2.2  --   --    GFR Estimated Creatinine Clearance: 47.3 mL/min (by C-G formula based on SCr of 0.85 mg/dL). Liver Function Tests: Recent Labs  Lab 02/15/21 1555 02/16/21 0439 02/17/21 0429  AST 17 18 16   ALT 15 13 13   ALKPHOS 58 49 42  BILITOT 0.8 0.7 0.4  PROT 7.2 6.0* 5.5*  ALBUMIN 3.9 3.3* 2.8*   Recent Labs  Lab 02/17/21 0429  LIPASE 37   No results for input(s): AMMONIA in the last 168 hours. Coagulation profile No results for input(s): INR, PROTIME in the last 168 hours.  CBC: Recent Labs  Lab 02/15/21 1555 02/16/21 0439  WBC 9.4 8.5  NEUTROABS 7.1  --   HGB 12.8 11.0*  HCT  38.4 33.2*  MCV 93.4 94.1  PLT 229 212   Cardiac Enzymes: No results for input(s): CKTOTAL, CKMB, CKMBINDEX, TROPONINI in the last 168 hours. BNP (last 3 results) No results for input(s): PROBNP in the last 8760 hours. CBG: No results for input(s): GLUCAP in the last 168 hours. D-Dimer: No results for input(s): DDIMER in the last 72 hours. Hgb A1c: No results for input(s): HGBA1C in the last 72 hours. Lipid Profile: No results for input(s): CHOL, HDL, LDLCALC, TRIG, CHOLHDL, LDLDIRECT in the last 72 hours. Thyroid function studies: No results for input(s): TSH, T4TOTAL, T3FREE, THYROIDAB in the last 72 hours.  Invalid input(s): FREET3  Anemia work up: No results for input(s): VITAMINB12, FOLATE, FERRITIN, TIBC, IRON, RETICCTPCT in the last 72 hours. Sepsis Labs: Recent Labs  Lab 02/15/21 1555 02/16/21 0439  WBC 9.4 8.5    Microbiology Recent Results (from the past 240 hour(s))  Urine Culture     Status: Abnormal   Collection Time: 02/15/21  8:59 AM   Specimen: Urine, Clean Catch  Result Value Ref Range Status   Specimen Description   Final    URINE, CLEAN CATCH Performed at [facility], [address], [place], 04/17/21 [facility]    Special Requests   Final    NONE Performed at [facility], [address], [place], [address] [facility]    Culture MULTIPLE SPECIES PRESENT, SUGGEST RECOLLECTION (A)  Final   Report Status 02/16/2021 FINAL  Final  Resp Panel by RT-PCR (Flu A&B, Covid) Nasopharyngeal Swab     Status: None   Collection Time: 02/15/21  7:50 PM   Specimen: Nasopharyngeal Swab; Nasopharyngeal(NP) swabs in vial transport medium  Result Value Ref Range Status   SARS Coronavirus 2 by RT PCR NEGATIVE NEGATIVE Final    Comment: (NOTE) SARS-CoV-2 target nucleic acids are NOT DETECTED.  The SARS-CoV-2 RNA is generally detectable in upper respiratory specimens during the acute phase of infection. The lowest concentration of SARS-CoV-2 viral copies this assay can  detect is 138 copies/mL. A negative result does not preclude SARS-Cov-2 infection and should not be used as the sole basis for treatment or other patient management decisions. A negative result may occur with  improper specimen collection/handling, submission of specimen other than nasopharyngeal swab, presence of viral mutation(s) within the areas targeted by this assay, and inadequate number of viral copies(<138 copies/mL). A negative result must be combined with clinical observations, patient history, and epidemiological information. The expected result is Negative.  Fact  Sheet for Patients:  BloggerCourse.comhttps://www.fda.gov/media/152166/download  Fact Sheet for Healthcare Providers:  SeriousBroker.ithttps://www.fda.gov/media/152162/download  This test is no t yet approved or cleared by the [place] FDA and  has been authorized for detection and/or diagnosis of SARS-CoV-2 by FDA under an Emergency Use Authorization (EUA). This EUA will remain  in effect (meaning this test can be used) for the duration of the COVID-19 declaration under Section 564(b)(1) of the Act, 21 U.S.C.section 360bbb-3(b)(1), unless the authorization is terminated  or revoked sooner.       Influenza A by PCR NEGATIVE NEGATIVE Final   Influenza B by PCR NEGATIVE NEGATIVE Final    Comment: (NOTE) The Xpert Xpress SARS-CoV-2/FLU/RSV plus assay is intended as an aid in the diagnosis of influenza from Nasopharyngeal swab specimens and should not be used as a sole basis for treatment. Nasal washings and aspirates are unacceptable for Xpert Xpress SARS-CoV-2/FLU/RSV testing.  Fact Sheet for Patients: BloggerCourse.comhttps://www.fda.gov/media/152166/download  Fact Sheet for Healthcare Providers: SeriousBroker.ithttps://www.fda.gov/media/152162/download  This test is not yet approved or cleared by the [place] FDA and has been authorized for detection and/or diagnosis of SARS-CoV-2 by FDA under an Emergency Use Authorization (EUA). This EUA will remain in  effect (meaning this test can be used) for the duration of the COVID-19 declaration under Section 564(b)(1) of the Act, 21 U.S.C. section 360bbb-3(b)(1), unless the authorization is terminated or revoked.  Performed at [facility], [address] [place], [place] [id number]     Procedures and diagnostic studies:  CT RENAL STONE STUDY  Result Date: 02/17/2021 CLINICAL DATA:  Hematuria with loss of appetite and nausea. EXAM: CT ABDOMEN AND PELVIS WITHOUT CONTRAST TECHNIQUE: Multidetector CT imaging of the abdomen and pelvis was performed following the standard protocol without IV contrast. COMPARISON:  Chest CT May 27, 2019 and CT April 01, 2012 FINDINGS: Lower chest: Motion degraded examination of the lung bases. Partially visualized hyperdensity in the left breast reflects inferior margin of the peripherally calcified left breast prosthesis seen on chest CT May 27, 2019. Hepatobiliary: Unremarkable noncontrast appearance of the hepatic parenchyma. Gallbladder surgically absent. Mild prominence of the biliary tree likely reflecting reservoir effect post cholecystectomy Pancreas: Within normal limits. Spleen: Within normal limits. Adrenals/Urinary Tract: Bilateral adrenal glands are unremarkable. No hydronephrosis. No renal, ureteral or bladder calculi visualized. Exophytic 6.2 cm right renal cyst and 3.4 cm left renal cyst. Urinary bladder is unremarkable for degree of distension. Stomach/Bowel: Ingested tablets in the stomach. No pathologic dilation of small or large bowel. The appendix and terminal ileum appear normal. No evidence of acute bowel inflammation. Vascular/Lymphatic: Aortic and branch vessel atherosclerosis without abdominal aortic aneurysm. No pathologically enlarged abdominal or pelvic lymph nodes visualized. Reproductive: Status post hysterectomy. No adnexal masses. Other: No significant abdominal or pelvic ascites. No pneumoperitoneum. No portal venous  gas. Postsurgical changes in the anterior abdominal wall. Musculoskeletal: Diffuse demineralization of bone. Lumbar spondylosis. No acute osseous abnormality. IMPRESSION: 1. No acute findings in the abdomen or pelvis. Specifically no evidence of obstructive uropathy. 2. Bilateral renal cysts. 3.  Aortic Atherosclerosis (ICD10-I70.0). Electronically Signed   By: Maudry MayhewJeffrey  Waltz M.D.   On: 02/17/2021 19:43    Medications:    FLUoxetine  10 mg Oral Daily   gabapentin  200 mg Oral BID   hydrOXYzine  25 mg Oral TID   pantoprazole  40 mg Oral QAC breakfast   propranolol  40 mg Oral BID   simvastatin  40 mg Oral QHS   Continuous Infusions:   LOS:  4 days   Joseph Art  [facility]   How to contact the [facility] Attending or Consulting provider 7A - 7P or covering provider during after hours 7P -7A, for this patient?  Check the care team in [facility] and look for a) attending/consulting [facility] provider listed and b) the [facility] team listed Log into [email or web address] and use [facility] universal password to access. If you do not have the password, please contact the hospital operator. Locate the [facility] provider you are looking for under [facility] and page to a number that you can be directly reached. If you still have difficulty reaching the provider, please page the [facility] (Director on Call) for the Hospitalists listed on amion for assistance.  02/19/2021, 1:46 PM

## 2021-02-19 NOTE — Progress Notes (Signed)
Dr Benjamine Mola aware pt still c/o severe HA. See new orders.

## 2021-02-20 ENCOUNTER — Other Ambulatory Visit: Payer: Self-pay | Admitting: Nurse Practitioner

## 2021-02-20 LAB — URINE CULTURE: Culture: NO GROWTH

## 2021-02-20 LAB — BASIC METABOLIC PANEL
Anion gap: 8 (ref 5–15)
BUN: 13 mg/dL (ref 8–23)
CO2: 24 mmol/L (ref 22–32)
Calcium: 8.6 mg/dL — ABNORMAL LOW (ref 8.9–10.3)
Chloride: 109 mmol/L (ref 98–111)
Creatinine, Ser: 0.82 mg/dL (ref 0.44–1.00)
GFR, Estimated: >60 mL/min (ref >60)
Glucose, Bld: 102 mg/dL — ABNORMAL HIGH (ref 70–99)
Potassium: 3.1 mmol/L — ABNORMAL LOW (ref 3.5–5.1)
Sodium: 141 mmol/L (ref 135–145)

## 2021-02-20 LAB — MAGNESIUM: Magnesium: 2 mg/dL (ref 1.7–2.4)

## 2021-02-20 MED ORDER — ATORVASTATIN CALCIUM 10 MG PO TABS: 20.0000 mg | ORAL_TABLET | Freq: Every day | ORAL | Status: DC

## 2021-02-20 MED ORDER — PROPRANOLOL HCL 10 MG PO TABS
10.0000 mg | ORAL_TABLET | Freq: Two times a day (BID) | ORAL | Status: DC
  Administered 2021-02-20: 10 mg via ORAL
  Filled 2021-02-20: qty 1

## 2021-02-20 MED ORDER — BD ASSURE BPM/AUTO ARM CUFF MISC: 0 refills | Status: DC

## 2021-02-20 MED ORDER — POTASSIUM CHLORIDE CRYS ER 10 MEQ PO TBCR: 40.0000 meq | EXTENDED_RELEASE_TABLET | Freq: Once | ORAL | Status: DC

## 2021-02-20 MED ORDER — ACETAMINOPHEN 500 MG PO TABS: 1000.0000 mg | ORAL_TABLET | Freq: Four times a day (QID) | ORAL | 0 refills | Status: DC | PRN

## 2021-02-20 MED ORDER — ATORVASTATIN CALCIUM 20 MG PO TABS: 20.0000 mg | ORAL_TABLET | Freq: Every day | ORAL | 0 refills | Status: DC

## 2021-02-20 MED ORDER — HYDROXYZINE HCL 25 MG PO TABS
25.0000 mg | ORAL_TABLET | Freq: Four times a day (QID) | ORAL | Status: DC
  Administered 2021-02-20: 25 mg via ORAL
  Filled 2021-02-20: qty 1

## 2021-02-20 MED ORDER — POTASSIUM CHLORIDE 20 MEQ PO PACK
40.0000 meq | PACK | Freq: Once | ORAL | Status: AC
  Administered 2021-02-20: 40 meq via ORAL
  Filled 2021-02-20: qty 2

## 2021-02-20 MED ORDER — MELATONIN 5 MG PO TABS: 5.0000 mg | ORAL_TABLET | Freq: Every evening | ORAL | Status: DC | PRN

## 2021-02-20 MED ORDER — METHOCARBAMOL 500 MG PO TABS: 500.0000 mg | ORAL_TABLET | Freq: Three times a day (TID) | ORAL | 0 refills | Status: DC | PRN

## 2021-02-20 MED ORDER — AMLODIPINE BESYLATE 10 MG PO TABS: 10.0000 mg | ORAL_TABLET | Freq: Every day | ORAL | Status: DC

## 2021-02-20 MED ORDER — PROCHLORPERAZINE MALEATE 5 MG PO TABS: 5.0000 mg | ORAL_TABLET | Freq: Four times a day (QID) | ORAL | 0 refills | Status: DC | PRN

## 2021-02-20 MED ORDER — HYDROXYZINE HCL 25 MG PO TABS: 25.0000 mg | ORAL_TABLET | Freq: Four times a day (QID) | ORAL | 0 refills | Status: DC

## 2021-02-20 MED ORDER — FLUOXETINE HCL 10 MG PO CAPS: 10.0000 mg | ORAL_CAPSULE | Freq: Every day | ORAL | 0 refills | Status: DC

## 2021-02-20 MED ORDER — AMLODIPINE BESYLATE 5 MG PO TABS
5.0000 mg | ORAL_TABLET | Freq: Once | ORAL | Status: AC
  Administered 2021-02-20: 5 mg via ORAL
  Filled 2021-02-20: qty 1

## 2021-02-20 MED ORDER — AMLODIPINE BESYLATE 5 MG PO TABS
5.0000 mg | ORAL_TABLET | Freq: Every day | ORAL | 0 refills | Status: DC
Start: 2021-02-21 — End: 2023-03-10

## 2021-02-20 MED ORDER — POTASSIUM CHLORIDE CRYS ER 20 MEQ PO TBCR
40.0000 meq | EXTENDED_RELEASE_TABLET | Freq: Once | ORAL | Status: DC
  Filled 2021-02-20: qty 2

## 2021-02-20 MED ORDER — AMLODIPINE BESYLATE 5 MG PO TABS
5.0000 mg | ORAL_TABLET | Freq: Every day | ORAL | Status: DC
  Administered 2021-02-20: 5 mg via ORAL
  Filled 2021-02-20: qty 1

## 2021-02-20 NOTE — Discharge Summary (Signed)
Physician Discharge Summary  [name] JJO:[id number] DOB: [birth year] DOA: 02/15/2021  PCP: Avon Gully, MD  Admit date: 02/15/2021 Discharge date: 02/20/2021  Admitted From: home Discharge disposition: home   Recommendations for Outpatient Follow-Up:   Referral to geri psych-- patient agreeable and will consider CBT Titrate medications as needed for BP, anxiety/depression EKG prn BMP 1 week re: K Referral to urology if hematuria not resolved   Discharge Diagnosis:   Principal Problem:   Hypokalemia Active Problems:   Anxiety   Nausea & vomiting   Dehydration   Prolonged QT interval   Generalized weakness   Neuropathy   GERD (gastroesophageal reflux disease)    Discharge Condition: Improved.  Diet recommendation: Low sodium, heart healthy   Wound care: None.  Code status: Full.   History of Present Illness:   [name] is a 75 y.o. female with medical history significant for generalized anxiety disorder, hyperlipidemia, peripheral neuropathy, GERD, who is admitted to [facility] on 02/15/2021 with hypokalemia after presenting from home to [facility] ED complaining of nausea vomiting.    The patient reports a longstanding history of generalized anxiety disorder which she states was well controlled on scheduled amitriptyline for several years.  However, she reports recently switching PCPs, with new PCP reportedly recommending discontinuation of her amitriptyline in favor different anxiolytic medication.  Correspondingly, the patient reports that her amitriptyline was abruptly discontinued without taper 1 month ago with initiation of an unclear replacement anxiety lytic medication at that time.  However, patient reports her finding that the new anxiolytic medication is not been as efficacious for her in managing her anxiety relative to the previous amitriptyline, prompting her to discontinue this medication a few weeks ago, without incident resumption of  the previously taken amitriptyline.     Within 3 to 4 days of the abrupt discontinuation of her amlodipine a month ago, the patient reports development of intermittent nausea and vomiting as well as intermittent headaches.  She reports continued persistence of her intermittent nausea/vomiting, stating that she has typically been experiencing 2-3 episodes of nonbloody, nonbilious emesis per day over the last 3 weeks, with most recent such episode occurring just prior to presentation to [facility] emergency department this evening.  She denies any associated abdominal discomfort, diarrhea, melena, or hematochezia.  She also denies any associated subjective fever, chills, rigors, or generalized myalgias.  Denies any associated chest pain, shortness of breath, diaphoresis, palpitations, dizziness, presyncope, or syncope.  No recent traveling or known COVID-19 exposures.  Denies any recent dysuria or change in urinary urgency/frequency.  No recent cough or hemoptysis.  She also denies any recent rhinitis, rhinorrhea, sore throat, neck stiffness, or rash.  She denies any regular or recent alcohol consumption, and denies use of recreational drugs.  In the setting of perpetuation of her nausea/vomiting, she has been taking as needed Reglan for its antiemetic properties, but notes no significant symptomatic relief with this measure.  Over the last week, in the setting of persistence of nausea/vomiting and associated decline in oral intake, the patient reports generalized weakness. Denies any associated acute focal weakness, acute focal numbness, paresthesias, slurred speech, facial droop, expressive aphasia, acute change in vision, dysphagia, or vertigo.   Concomitant with the onset of intermittent nausea/vomiting 3 weeks ago, the patient reports that she has been experiencing frequent symmetrical, frontal lobe headaches on a daily basis in the interval.  She denies any associated unilateral aspect of these headaches,  and denies any  associated visual or olfactory aura.   Hospital Course by Problem:   Severe hypokalemia -replete aggressively -eating well   Hematuria -Patient developed hematuria -Urine culture from 02/15/2021 grew multiple species-repeat culture pending, repeat negative  -UA shows more than 50 RBCs per high-power field -outpatient urology follow up if not resolved   Anxiety -Patient was taken off amitriptyline by her PCP and started on duloxetine -patient states that she could not tolerate duloxetine -Patient seen by psychiatry inpt -Started on hydroxyzine 25 mg  for anxiety- will change to 4x for now as improved  but developed symptoms prior to 4 pm dose -Fluoxetine 10 mg daily -seems improved today   Intractable nausea/poor p.o. intake -resolved -PRN medication for home   QTC prolongation -QTC was prolonged to 640 ms -Likely from severe hypokalemia as above -Potassium being replaced -resolved -monitor PRN   Generalized weakness -improved -needs to increase exercise   HA -improved -? TMJ per patient -prior to hydroxizine so do not think related -? Medication withdrawal as was on goody's at home    Medical Consultants:      Discharge Exam:   Vitals:   02/20/21 1009 02/20/21 1500  BP: (!) 162/90 (!) 153/65  Pulse:  (!) 49  Resp: 17 16  Temp:  98.5 F (36.9 C)  SpO2:  96%     General exam: Appears calm and comfortable.    The results of significant diagnostics from this hospitalization (including imaging, microbiology, ancillary and laboratory) are listed below for reference.     Procedures and Diagnostic Studies:   No results found.   Labs:   Basic Metabolic Panel: Recent Labs  Lab 02/15/21 1555 02/16/21 0439 02/17/21 0429 02/18/21 0457 02/20/21 0446  NA 144 142 142 144 141  K 2.4* 2.6* 2.6* 4.0 3.1*  CL 106 107 108 117* 109  CO2 28 28 26  21* 24  GLUCOSE 124* 101* 109* 100* 102*  BUN 14 12 11 10 13   CREATININE 0.98 0.78 0.91  0.85 0.82  CALCIUM 9.1 8.4* 8.3* 8.7* 8.6*  MG 1.9 2.2  --   --  2.0   GFR Estimated Creatinine Clearance: 49 mL/min (by C-G formula based on SCr of 0.82 mg/dL). Liver Function Tests: Recent Labs  Lab 02/15/21 1555 02/16/21 0439 02/17/21 0429  AST 17 18 16   ALT 15 13 13   ALKPHOS 58 49 42  BILITOT 0.8 0.7 0.4  PROT 7.2 6.0* 5.5*  ALBUMIN 3.9 3.3* 2.8*   Recent Labs  Lab 02/17/21 0429  LIPASE 37   No results for input(s): AMMONIA in the last 168 hours. Coagulation profile No results for input(s): INR, PROTIME in the last 168 hours.  CBC: Recent Labs  Lab 02/15/21 1555 02/16/21 0439  WBC 9.4 8.5  NEUTROABS 7.1  --   HGB 12.8 11.0*  HCT 38.4 33.2*  MCV 93.4 94.1  PLT 229 212   Cardiac Enzymes: No results for input(s): CKTOTAL, CKMB, CKMBINDEX, TROPONINI in the last 168 hours. BNP: Invalid input(s): POCBNP CBG: No results for input(s): GLUCAP in the last 168 hours. D-Dimer No results for input(s): DDIMER in the last 72 hours. Hgb A1c No results for input(s): HGBA1C in the last 72 hours. Lipid Profile No results for input(s): CHOL, HDL, LDLCALC, TRIG, CHOLHDL, LDLDIRECT in the last 72 hours. Thyroid function studies No results for input(s): TSH, T4TOTAL, T3FREE, THYROIDAB in the last 72 hours.  Invalid input(s): FREET3 Anemia work up No results for input(s): VITAMINB12, FOLATE, FERRITIN, TIBC, IRON, RETICCTPCT in  the last 72 hours. Microbiology Recent Results (from the past 240 hour(s))  Urine Culture     Status: Abnormal   Collection Time: 02/15/21  8:59 AM   Specimen: Urine, Clean Catch  Result Value Ref Range Status   Specimen Description   Final    URINE, CLEAN CATCH Performed at [facility], [address] [place], [place] [address]    Special Requests   Final    NONE Performed at [facility], [address], [place], [place] [address]    Culture MULTIPLE SPECIES PRESENT, SUGGEST RECOLLECTION (A)  Final   Report Status 02/16/2021 FINAL  Final   Resp Panel by RT-PCR (Flu A&B, Covid) Nasopharyngeal Swab     Status: None   Collection Time: 02/15/21  7:50 PM   Specimen: Nasopharyngeal Swab; Nasopharyngeal(NP) swabs in vial transport medium  Result Value Ref Range Status   SARS Coronavirus 2 by RT PCR NEGATIVE NEGATIVE Final    Comment: (NOTE) SARS-CoV-2 target nucleic acids are NOT DETECTED.  The SARS-CoV-2 RNA is generally detectable in upper respiratory specimens during the acute phase of infection. The lowest concentration of SARS-CoV-2 viral copies this assay can detect is 138 copies/mL. A negative result does not preclude SARS-Cov-2 infection and should not be used as the sole basis for treatment or other patient management decisions. A negative result may occur with  improper specimen collection/handling, submission of specimen other than nasopharyngeal swab, presence of viral mutation(s) within the areas targeted by this assay, and inadequate number of viral copies(<138 copies/mL). A negative result must be combined with clinical observations, patient history, and epidemiological information. The expected result is Negative.  Fact Sheet for Patients:  BloggerCourse.com  Fact Sheet for Healthcare Providers:  SeriousBroker.it  This test is no t yet approved or cleared by the [place] FDA and  has been authorized for detection and/or diagnosis of SARS-CoV-2 by FDA under an Emergency Use Authorization (EUA). This EUA will remain  in effect (meaning this test can be used) for the duration of the COVID-19 declaration under Section 564(b)(1) of the Act, 21 U.S.C.section 360bbb-3(b)(1), unless the authorization is terminated  or revoked sooner.       Influenza A by PCR NEGATIVE NEGATIVE Final   Influenza B by PCR NEGATIVE NEGATIVE Final    Comment: (NOTE) The Xpert Xpress SARS-CoV-2/FLU/RSV plus assay is intended as an aid in the diagnosis of influenza from  Nasopharyngeal swab specimens and should not be used as a sole basis for treatment. Nasal washings and aspirates are unacceptable for Xpert Xpress SARS-CoV-2/FLU/RSV testing.  Fact Sheet for Patients: BloggerCourse.com  Fact Sheet for Healthcare Providers: SeriousBroker.it  This test is not yet approved or cleared by the [place] FDA and has been authorized for detection and/or diagnosis of SARS-CoV-2 by FDA under an Emergency Use Authorization (EUA). This EUA will remain in effect (meaning this test can be used) for the duration of the COVID-19 declaration under Section 564(b)(1) of the Act, 21 U.S.C. section 360bbb-3(b)(1), unless the authorization is terminated or revoked.  Performed at [facility], [address], [place], [place] [address]   Urine Culture     Status: None   Collection Time: 02/17/21  3:20 AM   Specimen: Urine, Clean Catch  Result Value Ref Range Status   Specimen Description   Final    URINE, CLEAN CATCH Performed at [facility], [address], [place], [place] [address]    Special Requests   Final  NONE Performed at [facility], [address], [place], [place] [address]    Culture   Final    NO GROWTH Performed at [facility], [address] [place], [place] [address]    Report Status 02/20/2021 FINAL  Final     Discharge Instructions:   Discharge Instructions     Ambulatory referral to Behavioral Health   Complete by: As directed    geripsych   Diet - low sodium heart healthy   Complete by: As directed    Discharge instructions   Complete by: As directed    Monitor blood pressure and bring log to PCP   Increase activity slowly   Complete by: As directed       Allergies as of 02/20/2021       Reactions   Codeine Nausea Only   Prednisone Palpitations, Hypertension        Medication List     STOP taking  these medications    BC HEADACHE POWDER PO   cephALEXin 500 MG capsule Commonly known as: KEFLEX   furosemide 40 MG tablet Commonly known as: LASIX   meclizine 25 MG tablet Commonly known as: ANTIVERT   ondansetron 4 MG tablet Commonly known as: ZOFRAN   simvastatin 40 MG tablet Commonly known as: ZOCOR       TAKE these medications    acetaminophen 500 MG tablet Commonly known as: TYLENOL Take 2 tablets (1,000 mg total) by mouth every 6 (six) hours as needed for mild pain (or Fever >/= 101).   amLODipine 5 MG tablet Commonly known as: NORVASC Take 1 tablet (5 mg total) by mouth daily. Start taking on: February 21, 2021   atorvastatin 20 MG tablet Commonly known as: LIPITOR Take 1 tablet (20 mg total) by mouth at bedtime.   B-D ASSURE BPM/AUTO ARM CUFF Misc Check BP daily and bring log to PCP   FLUoxetine 10 MG capsule Commonly known as: PROZAC Take 1 capsule (10 mg total) by mouth daily. Start taking on: February 21, 2021   gabapentin 100 MG capsule Commonly known as: NEURONTIN Take 100 mg by mouth 3 (three) times daily. What changed: Another medication with the same name was removed. Continue taking this medication, and follow the directions you see here.   hydrOXYzine 25 MG tablet Commonly known as: ATARAX/VISTARIL Take 1 tablet (25 mg total) by mouth 4 (four) times daily.   melatonin 5 MG Tabs Take 1 tablet (5 mg total) by mouth at bedtime as needed (insomnia).   methocarbamol 500 MG tablet Commonly known as: ROBAXIN Take 1 tablet (500 mg total) by mouth every 8 (eight) hours as needed for muscle spasms.   pantoprazole 40 MG tablet Commonly known as: PROTONIX Take 1 tablet (40 mg total) by mouth daily before breakfast.   potassium chloride 10 MEQ tablet Commonly known as: KLOR-CON Take 20 mEq by mouth every morning.   prochlorperazine 5 MG tablet Commonly known as: COMPAZINE Take 1 tablet (5 mg total) by mouth every 6 (six) hours as needed  for nausea or vomiting.   propranolol 20 MG tablet Commonly known as: INDERAL Take 10 mg by mouth 2 (two) times daily. What changed: Another medication with the same name was removed. Continue taking this medication, and follow the directions you see here.   Vitamin D (Cholecalciferol) 10 MCG (400 UNIT) Tabs Take 400 Units by mouth at bedtime.        Follow-up Information     Felecia Shelling,  [place], MD Follow up in 1 week(s).   Specialty: Internal Medicine Why: BMP 1 week Contact information: [address] [place] [id number] [phone]                  Time coordinating discharge: 35 min  Signed:  Joseph ArtJessica U Oceanna Arruda DO  [facility] 02/20/2021, 4:48 PM

## 2021-02-20 NOTE — Progress Notes (Signed)
   02/20/21 1400  Mobility  Activity Refused mobility    Pt IND and being d/c.   [facility] Specialist Acute Rehab Services Office: [phone]

## 2021-02-20 NOTE — Progress Notes (Signed)
Called daughter, no answer and voicemail full. Marlin Canary DO

## 2021-02-20 NOTE — Progress Notes (Signed)
Pt being discharged to home with daughter. Discharge instructions and medication education provided to pt.  

## 2021-03-05 DIAGNOSIS — R634 Abnormal weight loss: Secondary | ICD-10-CM | POA: Diagnosis not present

## 2021-03-05 DIAGNOSIS — K219 Gastro-esophageal reflux disease without esophagitis: Secondary | ICD-10-CM | POA: Diagnosis not present

## 2021-03-05 DIAGNOSIS — F411 Generalized anxiety disorder: Secondary | ICD-10-CM | POA: Diagnosis not present

## 2021-03-05 DIAGNOSIS — I10 Essential (primary) hypertension: Secondary | ICD-10-CM | POA: Diagnosis not present

## 2021-03-06 DIAGNOSIS — I10 Essential (primary) hypertension: Secondary | ICD-10-CM | POA: Diagnosis not present

## 2021-03-12 ENCOUNTER — Other Ambulatory Visit: Payer: Self-pay | Admitting: Nurse Practitioner

## 2021-03-12 DIAGNOSIS — E781 Pure hyperglyceridemia: Secondary | ICD-10-CM

## 2021-03-23 ENCOUNTER — Other Ambulatory Visit: Payer: Self-pay | Admitting: Nurse Practitioner

## 2021-03-23 DIAGNOSIS — K219 Gastro-esophageal reflux disease without esophagitis: Secondary | ICD-10-CM

## 2021-04-04 DIAGNOSIS — F411 Generalized anxiety disorder: Secondary | ICD-10-CM | POA: Diagnosis not present

## 2021-04-04 DIAGNOSIS — I10 Essential (primary) hypertension: Secondary | ICD-10-CM | POA: Diagnosis not present

## 2021-04-16 DIAGNOSIS — Z79891 Long term (current) use of opiate analgesic: Secondary | ICD-10-CM | POA: Diagnosis not present

## 2021-04-16 DIAGNOSIS — F4312 Post-traumatic stress disorder, chronic: Secondary | ICD-10-CM | POA: Diagnosis not present

## 2021-04-16 DIAGNOSIS — F411 Generalized anxiety disorder: Secondary | ICD-10-CM | POA: Diagnosis not present

## 2021-04-25 IMAGING — CT CT HEAD WITHOUT CONTRAST
3 series · 16 of 47 positions shown, 19 images · non-contrast
Comparison: MR brain dated January 26, 2017. CT head dated September 13, 2014.

CLINICAL DATA: Persistent headache since fall 2-3 weeks ago.

EXAM:
CT HEAD WITHOUT CONTRAST
TECHNIQUE: Contiguous axial images were obtained from the base of the skull
through the vertex without intravenous contrast.

[Series 2: head w o · axial · 0.39mm/px · z∈[+81,+206]mm · 10 of 30 slices shown, 13 images]
[im 3/30  brain]
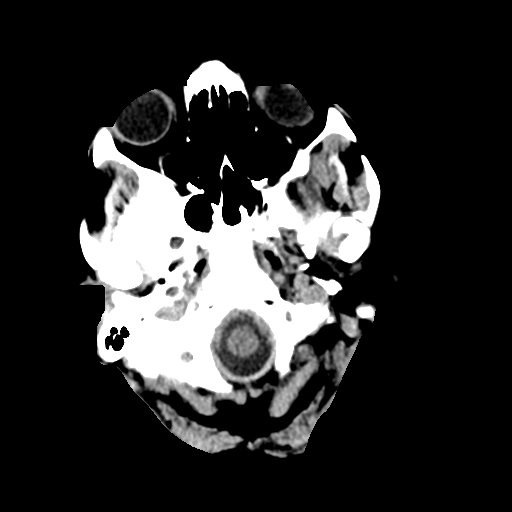
[im 3/30  bone]
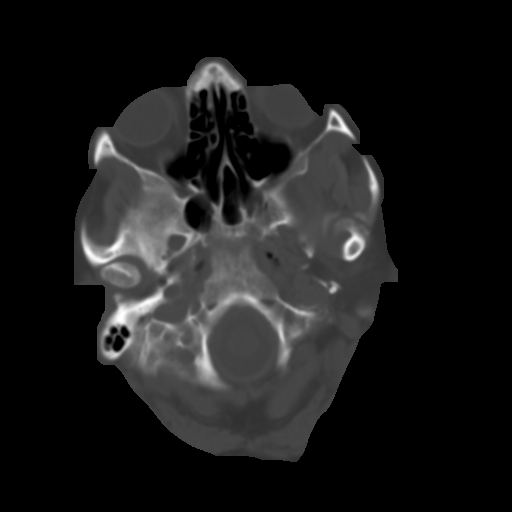
[im 6/30  brain]
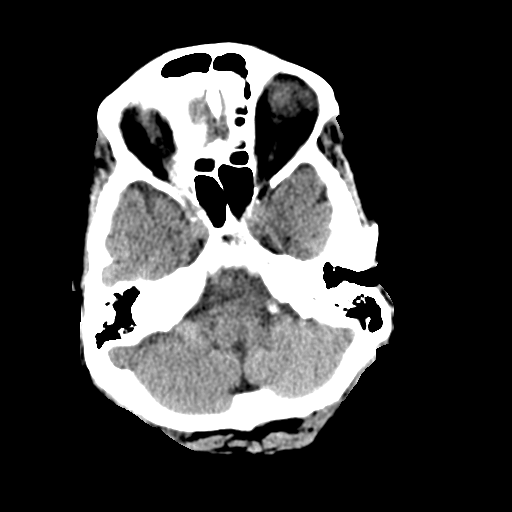
[im 9/30  brain]
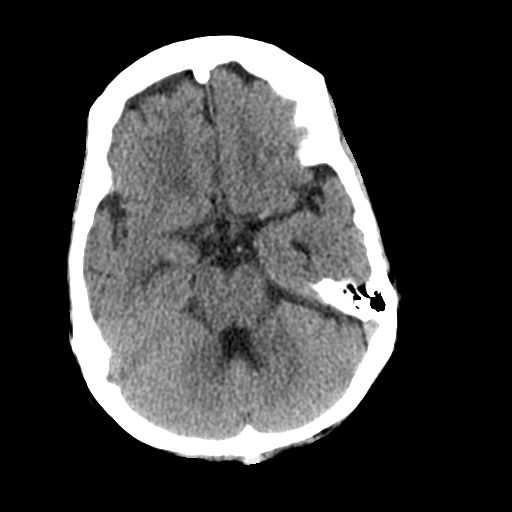
[im 11/30  brain]
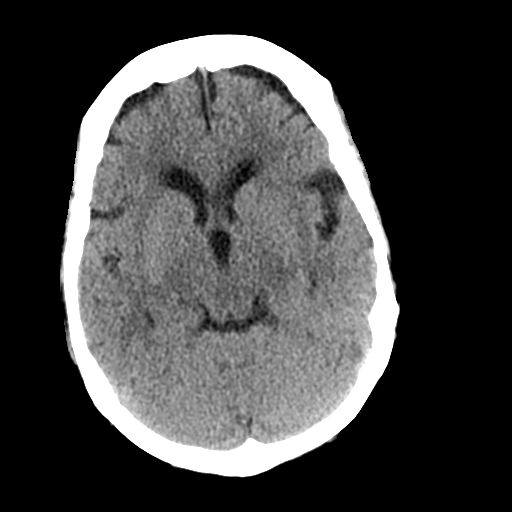
[im 14/30  brain]
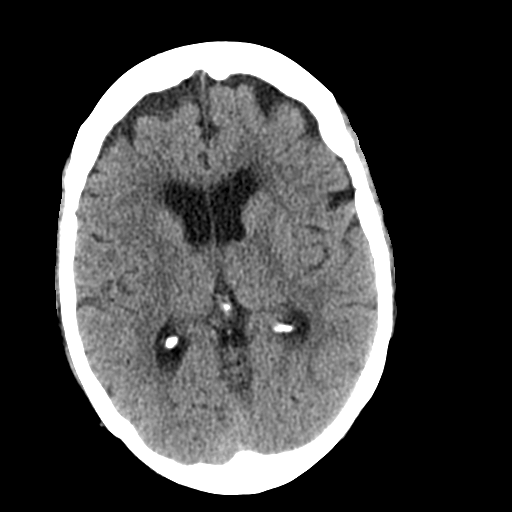
[im 14/30  bone]
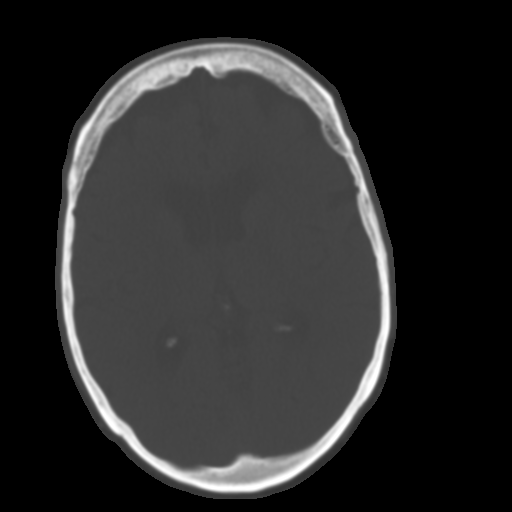
[im 17/30  brain]
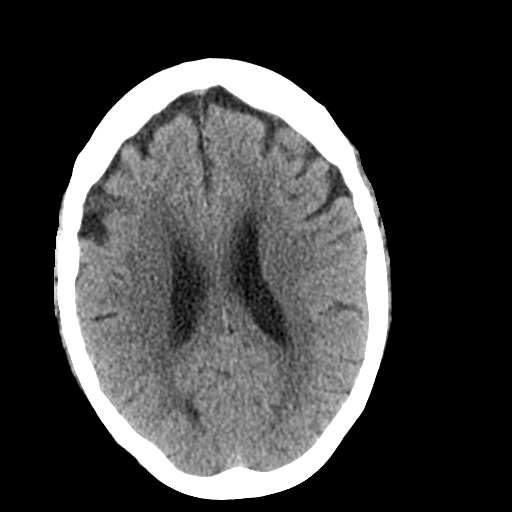
[im 20/30  brain]
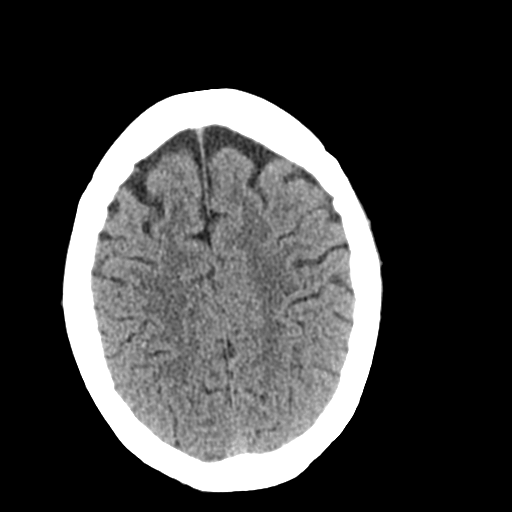
[im 23/30  brain]
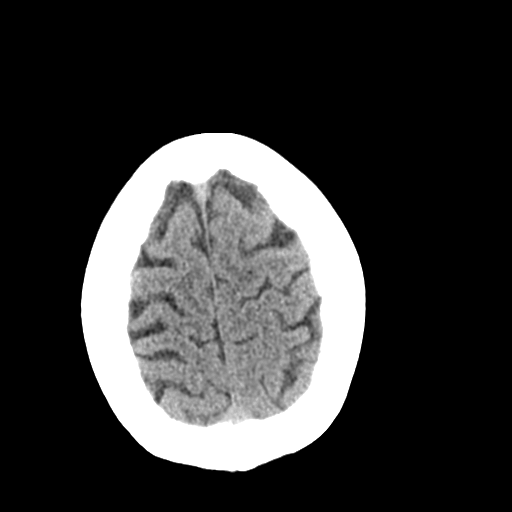
[im 25/30  brain]
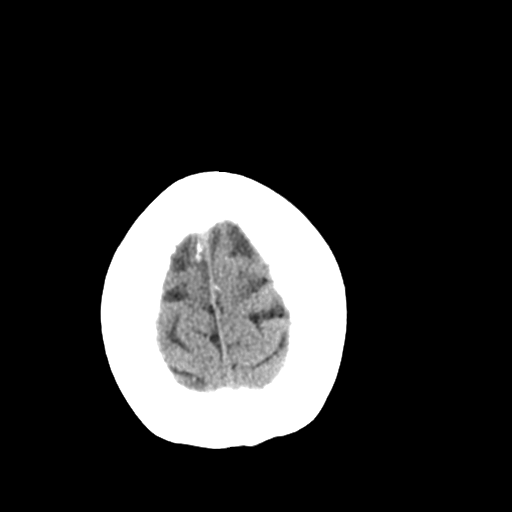
[im 25/30  bone]
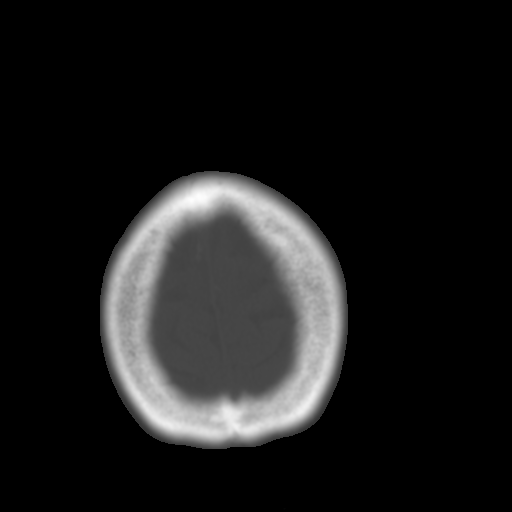
[im 28/30  brain]
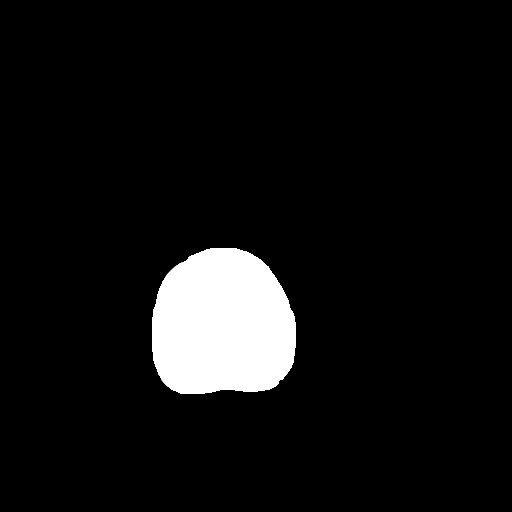

[Series 4: coronal soft · coronal · 0.30mm/px · 3 of 66 slices shown]
[im 22/66  brain]
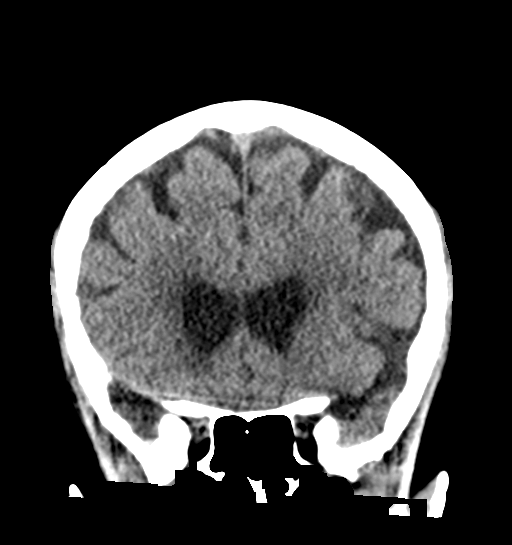
[im 29/66  brain]
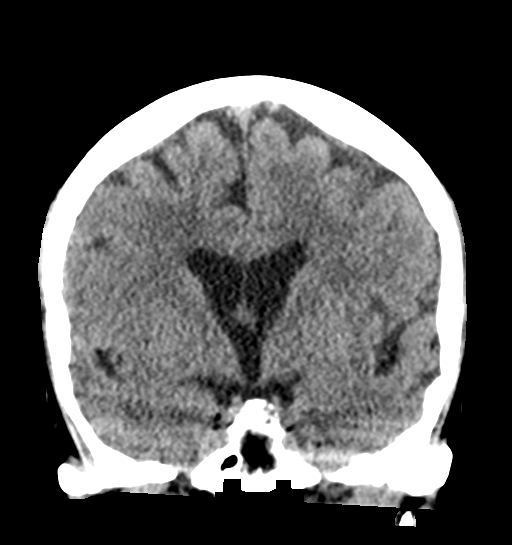
[im 37/66  brain]
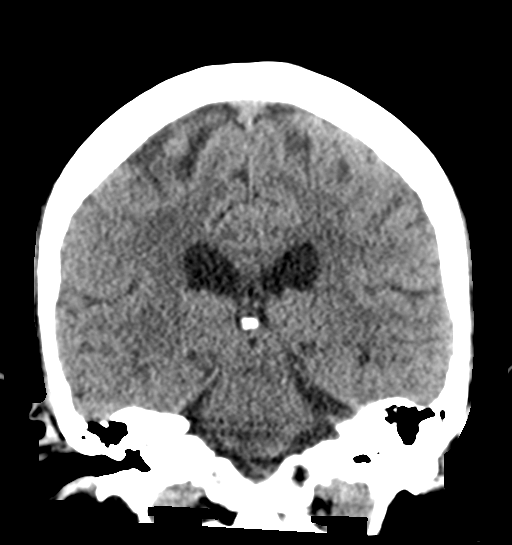

[Series 5: sagittal soft · sagittal · 0.33mm/px · 3 of 52 slices shown]
[im 18/52  brain]
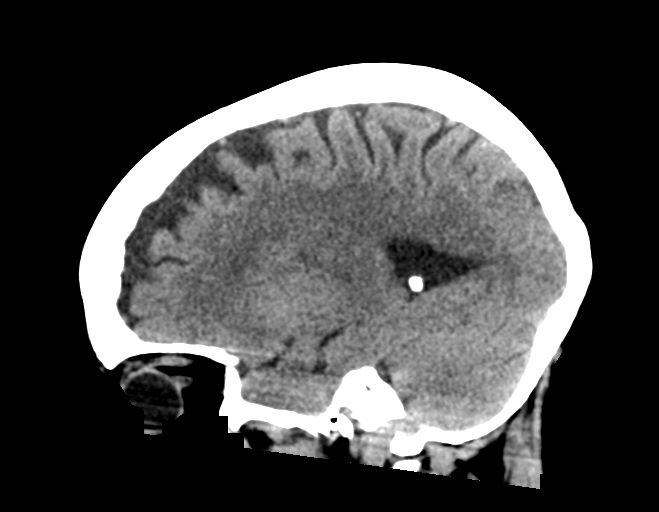
[im 26/52  brain]
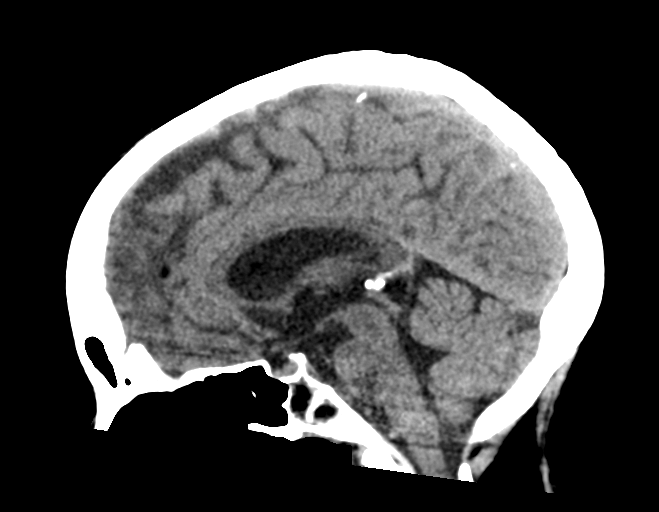
[im 35/52  brain]
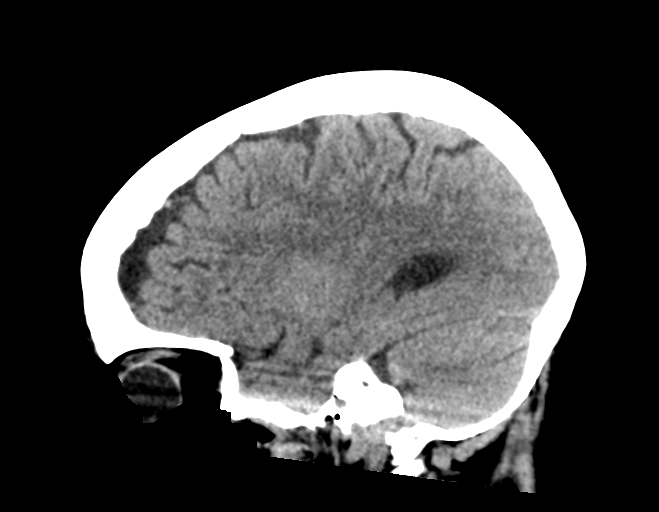

[16 of 47 positions shown; findings below may reference images not displayed]

FINDINGS: Brain: No evidence of acute infarction, hemorrhage, hydrocephalus,
extra-axial collection or mass lesion/mass effect.

Vascular: No hyperdense vessel or unexpected calcification.

Skull: Normal. Negative for fracture or focal lesion.

Sinuses/Orbits: No acute finding.

Other: None.
IMPRESSION: 1.  No acute intracranial abnormality.

## 2021-05-05 DIAGNOSIS — M1990 Unspecified osteoarthritis, unspecified site: Secondary | ICD-10-CM | POA: Diagnosis not present

## 2021-05-05 DIAGNOSIS — I10 Essential (primary) hypertension: Secondary | ICD-10-CM | POA: Diagnosis not present

## 2021-05-14 DIAGNOSIS — F411 Generalized anxiety disorder: Secondary | ICD-10-CM | POA: Diagnosis not present

## 2021-05-14 DIAGNOSIS — F4312 Post-traumatic stress disorder, chronic: Secondary | ICD-10-CM | POA: Diagnosis not present

## 2021-08-27 IMAGING — DX DG CHEST 1V PORT
1 series · 1 of 1 positions shown · non-contrast
Comparison: 05/07/2019.

CLINICAL DATA: Pt states she has been having back pain and chest
pressure for several hours. Pt was recently taken off her pain
medications due to pt overtaking them.

EXAM:
PORTABLE CHEST 1 VIEW

[chest ap]
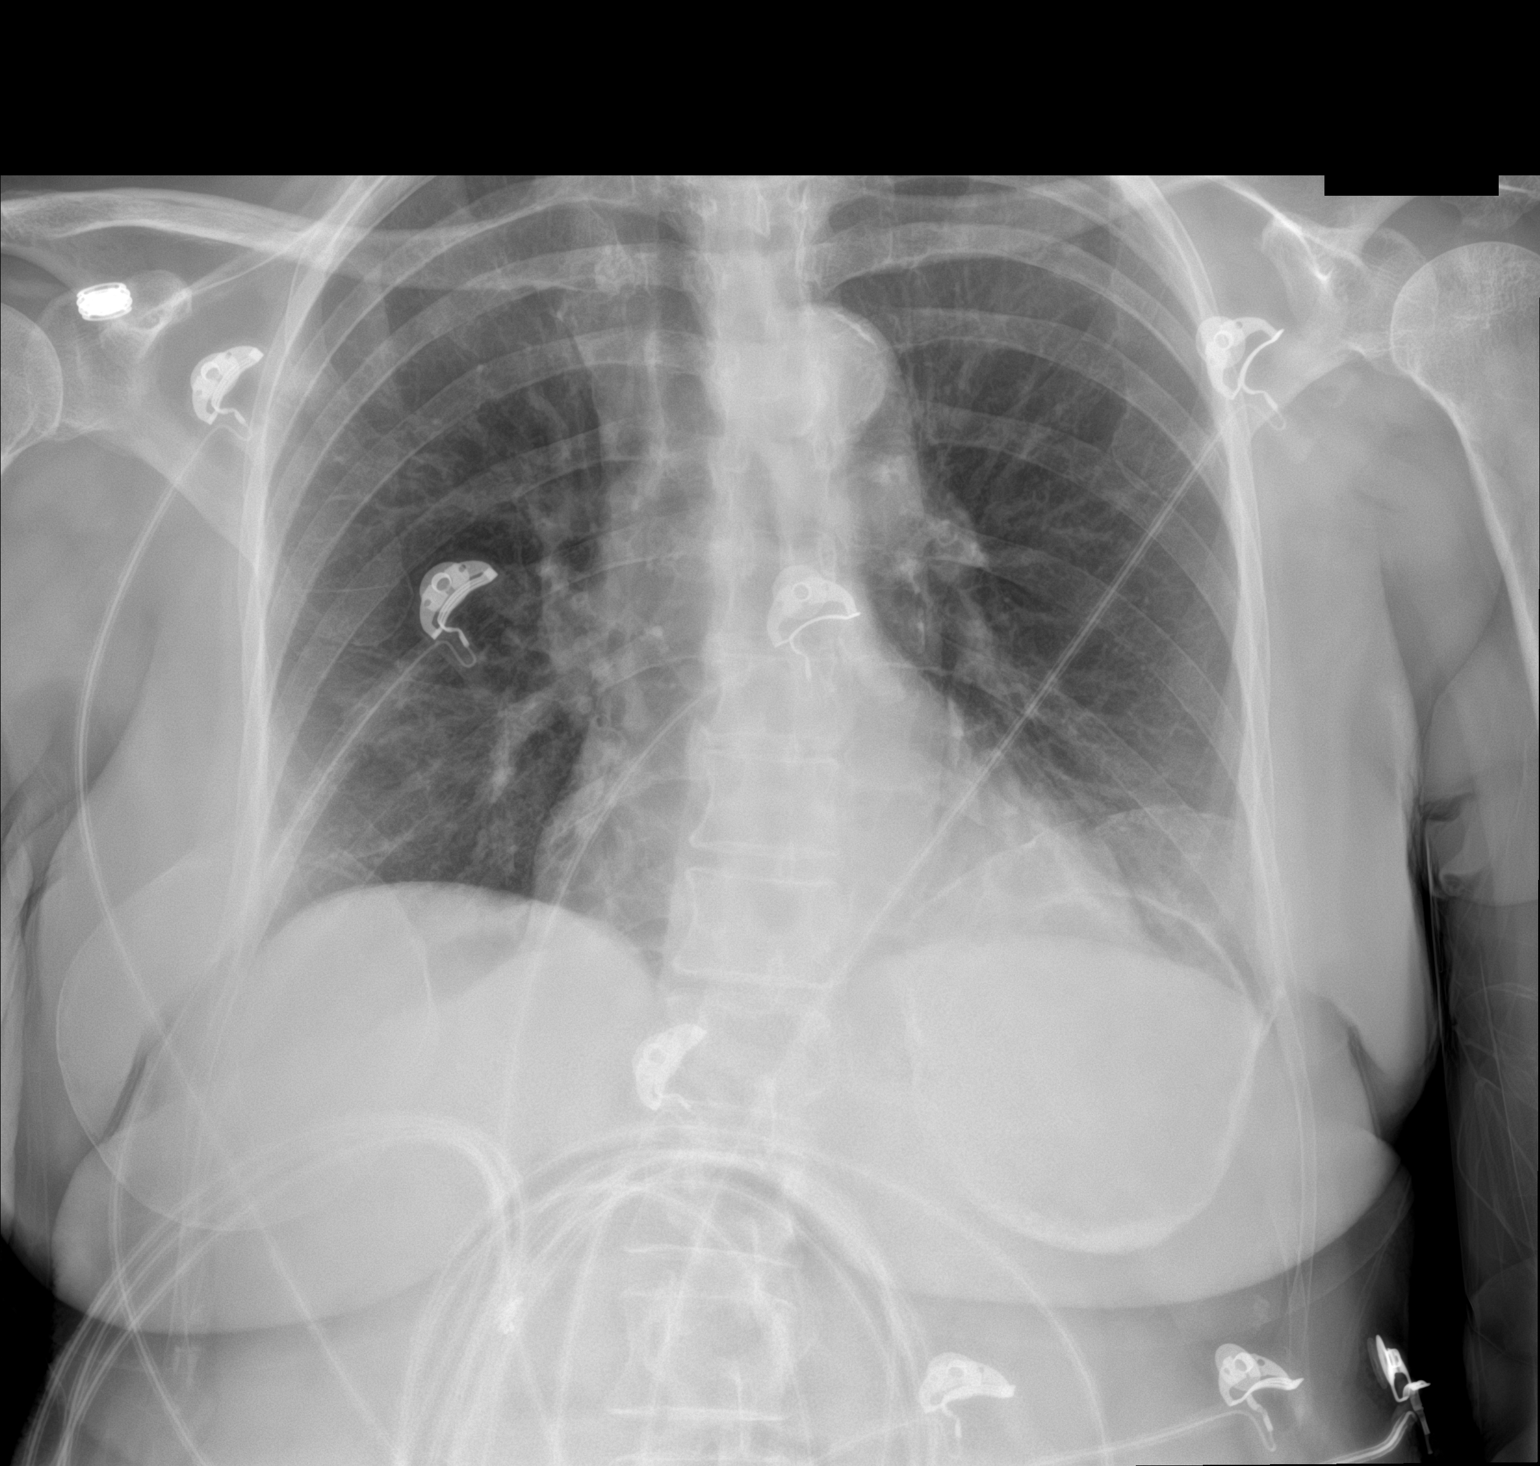

[1 of 1 positions shown; findings below may reference images not displayed]

FINDINGS: Cardiac silhouette is normal in size. No mediastinal or hilar
masses. No evidence of adenopathy.

Clear lungs.  No pleural effusion or pneumothorax.

Skeletal structures are grossly intact.
IMPRESSION: No active disease.

## 2021-08-28 IMAGING — US US EXTREM LOW VENOUS
1 series · 13 of 24 positions shown · non-contrast
Comparison: None.

CLINICAL DATA: Pulmonary embolism. History of previous DVT.
Evaluate acute or chronic DVT.



[Series 1: us extrem low venous · 0.08mm/px · 13 of 88 slices shown]
[im 1/88]
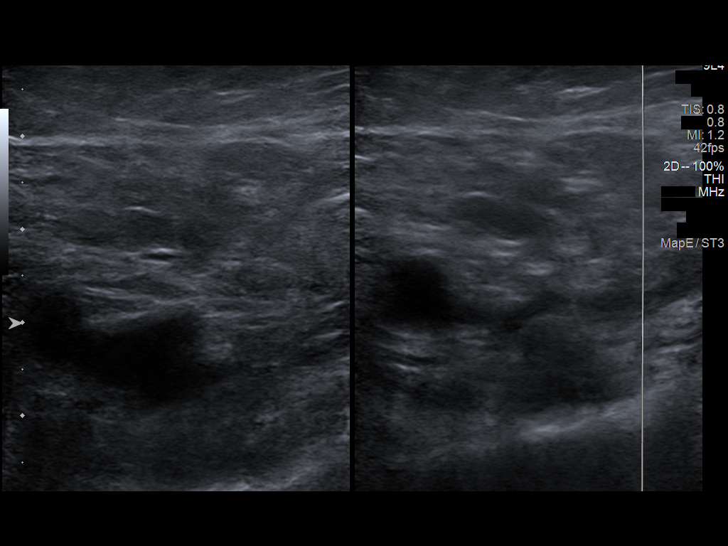
[im 8/88]
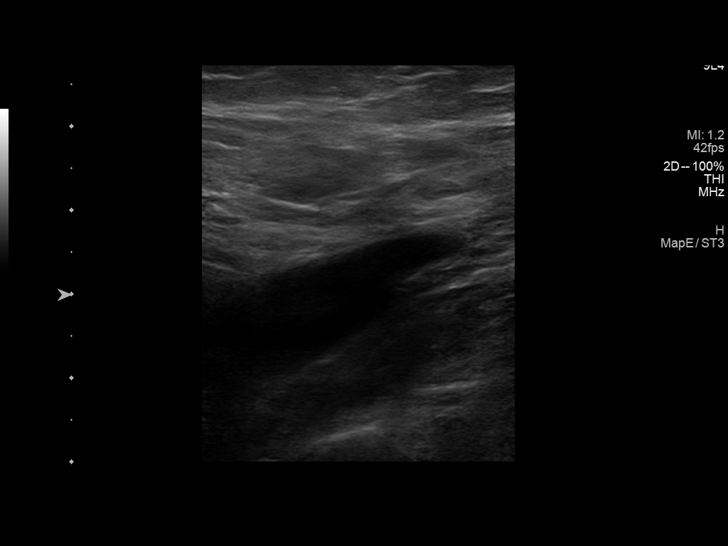
[im 16/88]
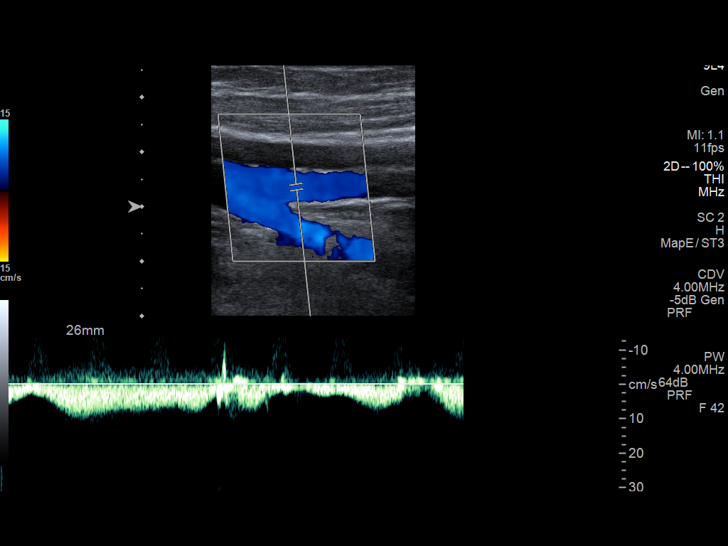
[im 23/88]
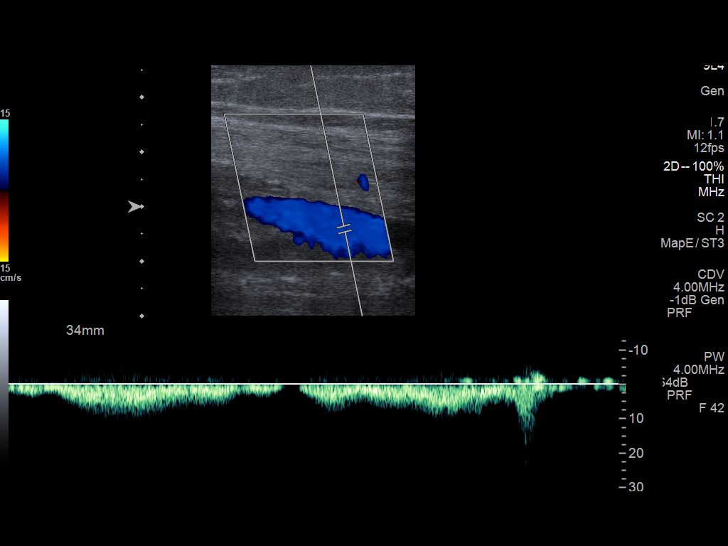
[im 31/88]
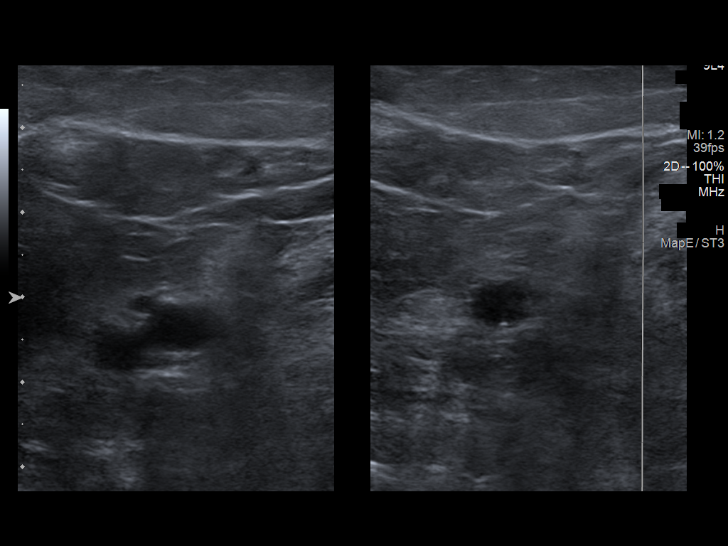
[im 38/88]
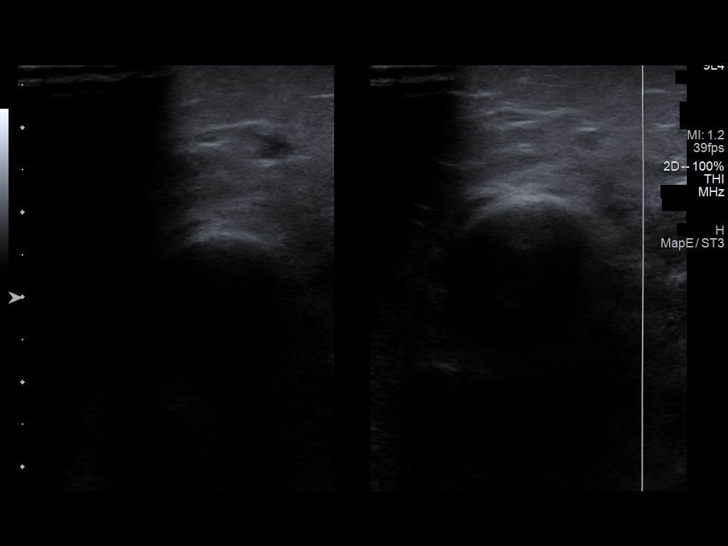
[im 46/88]
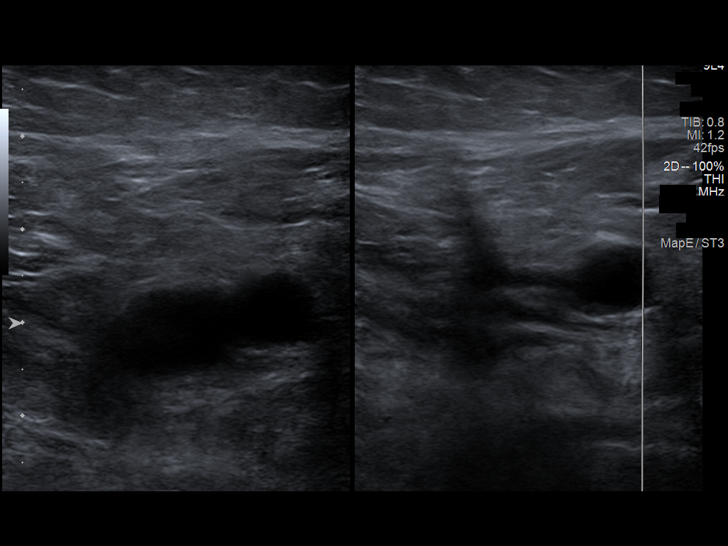
[im 50/88]
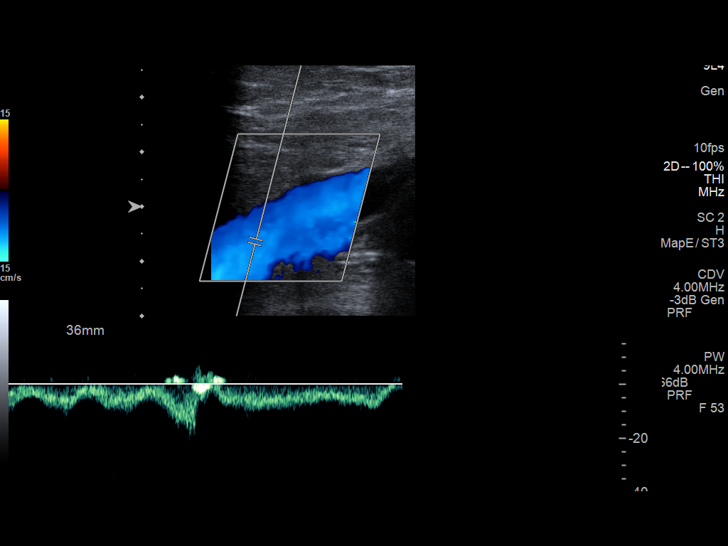
[im 57/88]
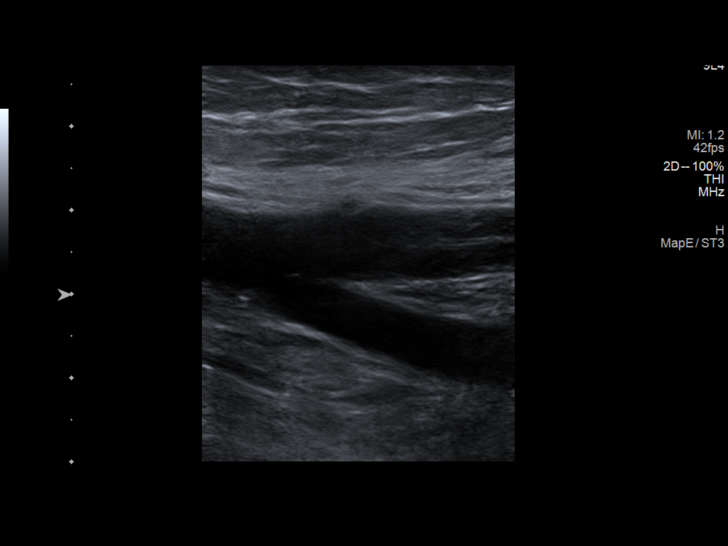
[im 65/88]
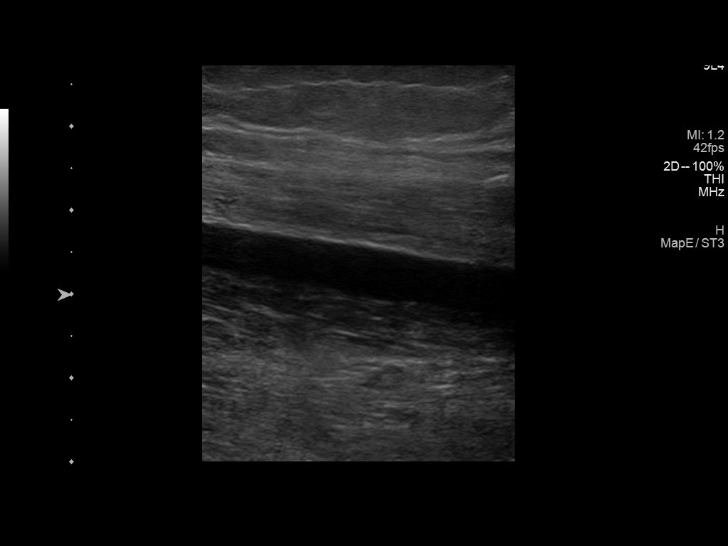
[im 72/88]
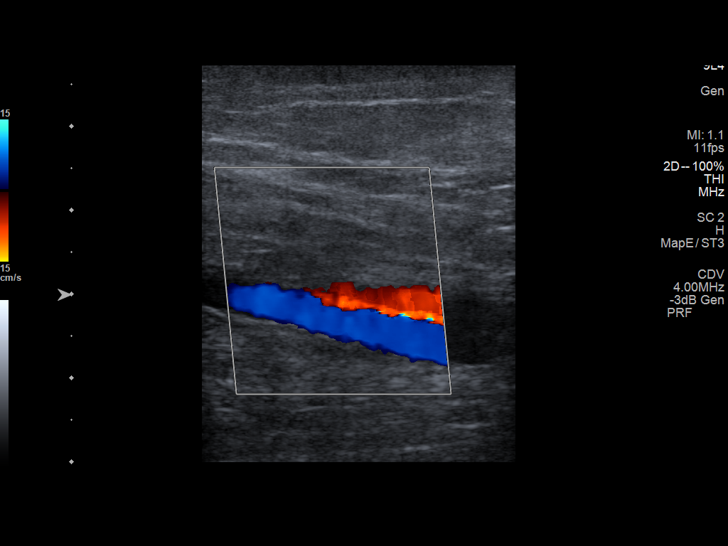
[im 80/88]
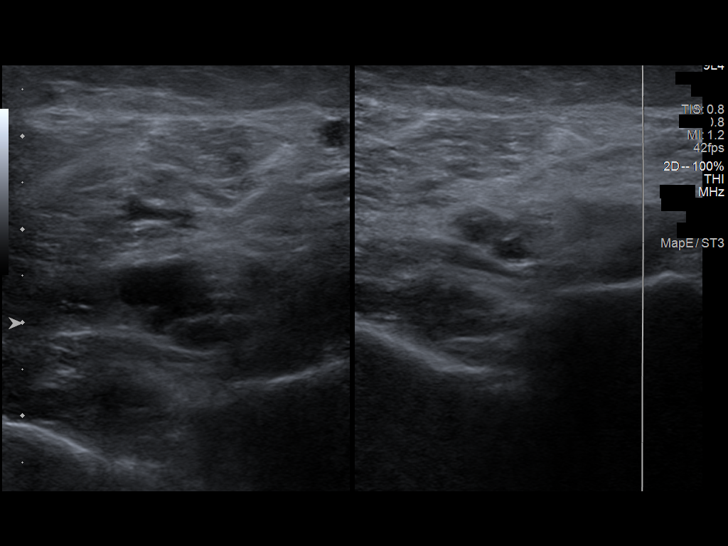
[im 88/88]
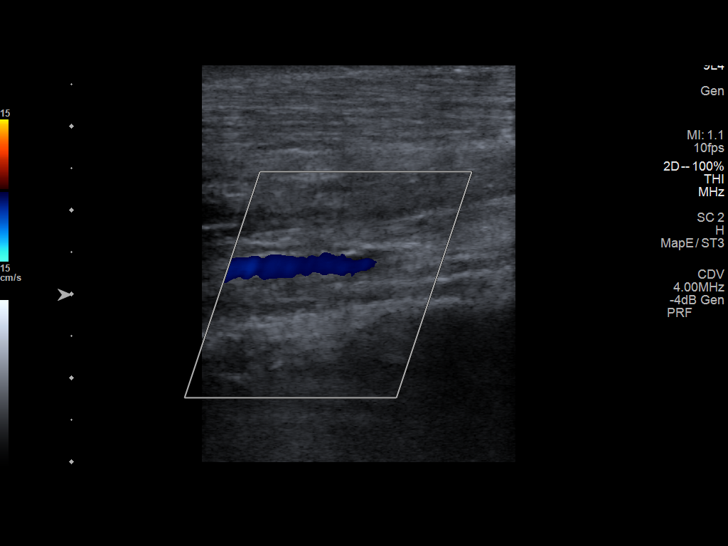

[13 of 24 positions shown; findings below may reference images not displayed]

FINDINGS: RIGHT LOWER EXTREMITY

Common Femoral Vein: No evidence of thrombus. Normal
compressibility, respiratory phasicity and response to augmentation.

Saphenofemoral Junction: No evidence of thrombus. Normal
compressibility and flow on color Doppler imaging.

Profunda Femoral Vein: No evidence of thrombus. Normal
compressibility and flow on color Doppler imaging.

Femoral Vein: No evidence of thrombus. Normal compressibility,
respiratory phasicity and response to augmentation.

Popliteal Vein: No evidence of thrombus. Normal compressibility,
respiratory phasicity and response to augmentation.

Calf Veins: No evidence of thrombus. Normal compressibility and flow
on color Doppler imaging.

Superficial Great Saphenous Vein: No evidence of thrombus. Normal
compressibility.

Venous Reflux:  None.

Other Findings:  None.

LEFT LOWER EXTREMITY

Common Femoral Vein: No evidence of thrombus. Normal
compressibility, respiratory phasicity and response to augmentation.

Saphenofemoral Junction: No evidence of thrombus. Normal
compressibility and flow on color Doppler imaging.

Profunda Femoral Vein: No evidence of thrombus. Normal
compressibility and flow on color Doppler imaging.

Femoral Vein: No evidence of thrombus. Normal compressibility,
respiratory phasicity and response to augmentation.

Popliteal Vein: No evidence of thrombus. Normal compressibility,
respiratory phasicity and response to augmentation.

Calf Veins: No evidence of thrombus. Normal compressibility and flow
on color Doppler imaging.

Superficial Great Saphenous Vein: No evidence of thrombus. Normal
compressibility.

Venous Reflux:  None.

Other Findings:  None.
IMPRESSION: No evidence of acute or chronic DVT within either lower extremity.

## 2022-12-06 ENCOUNTER — Ambulatory Visit
Admission: EM | Admit: 2022-12-06 | Discharge: 2022-12-06 | Disposition: A | Discharge status: Home / Self Care | Payer: PPO | Attending: Nurse Practitioner | Admitting: Nurse Practitioner

## 2022-12-06 ENCOUNTER — Telehealth: Payer: Self-pay

## 2022-12-06 ENCOUNTER — Ambulatory Visit (HOSPITAL_COMMUNITY)
Admission: RE | Admit: 2022-12-06 | Discharge: 2022-12-06 | Disposition: A | Discharge status: Home / Self Care | Payer: PPO | Source: Ambulatory Visit | Attending: Internal Medicine | Admitting: Internal Medicine

## 2022-12-06 DIAGNOSIS — M79604 Pain in right leg: Secondary | ICD-10-CM | POA: Insufficient documentation

## 2022-12-06 DIAGNOSIS — M7989 Other specified soft tissue disorders: Secondary | ICD-10-CM | POA: Insufficient documentation

## 2022-12-06 NOTE — Discharge Instructions (Addendum)
Please go to [facility] as scheduled to have the ultrasound of your right leg.  As we discussed, I am concerned you may have a blood clot in that leg.   If so, we can get you in with the DVT clinic in [place].  If the ultrasound is negative, recommend follow up with PCP for further evaluation/management.

## 2022-12-06 NOTE — ED Triage Notes (Signed)
Pt reports she has right leg swelling to her knees x 1 month.

## 2022-12-06 NOTE — ED Provider Notes (Signed)
[facility]    CSN: [id number] Arrival date & time: 12/06/22  1343      History   Chief Complaint No chief complaint on file.   HPI [name] is a 77 y.o. female.   Patient presents today with sister for 1 month history of right lower extremity swelling.  No redness or warmth.  No recent fall, accident, or injury to the leg that she knows of.  Sister reports the patient has been falling a lot at home and is not telling her every time she falls.  No inability to bear weight or recent surgery.  Patient denies shortness of breath or chest pain today.  Reports she does not know what medication she takes, she sees a PCP in [place].  Medical history significant for stroke, neuropathy, fibromyalgia, pulmonary embolism, and CKD stage III.  She is a former cigarette smoker.    Past Medical History:  Diagnosis Date   Anxiety    Back pain    Fibromyalgia    Hyperlipemia    Hypothyroidism    Neuropathy    Sinusitis    Stroke [facility])    no deficits    Patient Active Problem List   Diagnosis Date Noted   Nausea & vomiting 02/16/2021   Dehydration 02/16/2021   Prolonged QT interval 02/16/2021   Generalized weakness 02/16/2021   Neuropathy    GERD (gastroesophageal reflux disease)    Hypokalemia 02/15/2021   Hyperlipidemia 07/16/2020   Chronic kidney disease, stage III (moderate) (HCC) 07/16/2020   PE (pulmonary thromboembolism) (HCC) 05/27/2019   Esophageal dysphagia 08/10/2018   Back pain 05/05/2012   Fibromyalgia 05/05/2012   Chest pressure 04/03/2012   Carotid bruit 04/03/2012   Anxiety 10/05/2011    Past Surgical History:  Procedure Laterality Date   ABDOMINAL HYSTERECTOMY     BIOPSY  08/25/2018   Procedure: BIOPSY;  Surgeon: Malissa Hippo, MD;  [facility];  Service: Endoscopy;;   CATARACT EXTRACTION Bilateral    CHOLECYSTECTOMY     ESOPHAGEAL DILATION N/A 06/27/2015   Procedure: ESOPHAGEAL DILATION;  Surgeon: Malissa Hippo, MD;   [facility];  Service: Endoscopy;  Laterality: N/A;   ESOPHAGEAL DILATION N/A 08/25/2018   Procedure: ESOPHAGEAL DILATION;  Surgeon: Malissa Hippo, MD;  [facility];  Service: Endoscopy;  Laterality: N/A;   ESOPHAGOGASTRODUODENOSCOPY (EGD) WITH PROPOFOL N/A 06/27/2015   Procedure: ESOPHAGOGASTRODUODENOSCOPY (EGD) WITH PROPOFOL;  Surgeon: Malissa Hippo, MD;  [facility];  Service: Endoscopy;  Laterality: N/A;   ESOPHAGOGASTRODUODENOSCOPY (EGD) WITH PROPOFOL N/A 08/25/2018   Procedure: ESOPHAGOGASTRODUODENOSCOPY (EGD) WITH PROPOFOL;  Surgeon: Malissa Hippo, MD;  [facility];  Service: Endoscopy;  Laterality: N/A;  9:30    OB History   No obstetric history on file.      Home Medications    Prior to Admission medications   Medication Sig Start Date End Date Taking? Authorizing Provider  acetaminophen (TYLENOL) 500 MG tablet Take 2 tablets (1,000 mg total) by mouth every 6 (six) hours as needed for mild pain (or Fever >/= 101). 02/20/21   Marlin Canary U, DO  amLODipine (NORVASC) 5 MG tablet Take 1 tablet (5 mg total) by mouth daily. 02/21/21   Joseph Art, DO  atorvastatin (LIPITOR) 20 MG tablet Take 1 tablet (20 mg total) by mouth at bedtime. 02/20/21   Joseph Art, DO  Blood Pressure Monitoring (B-D ASSURE BPM/AUTO ARM CUFF) MISC Check BP daily and  bring log to PCP 02/20/21   Joseph Art, DO  FLUoxetine (PROZAC) 10 MG capsule Take 1 capsule (10 mg total) by mouth daily. 02/21/21   Joseph Art, DO  gabapentin (NEURONTIN) 100 MG capsule Take 100 mg by mouth 3 (three) times daily.  Patient not taking: No sig reported    [provider]  hydrOXYzine (ATARAX/VISTARIL) 25 MG tablet Take 1 tablet (25 mg total) by mouth 4 (four) times daily. 02/20/21   Marlin Canary U, DO  melatonin 5 MG TABS Take 1 tablet (5 mg total) by mouth at bedtime as needed (insomnia). 02/20/21   Joseph Art, DO  methocarbamol (ROBAXIN) 500 MG tablet Take  1 tablet (500 mg total) by mouth every 8 (eight) hours as needed for muscle spasms. 02/20/21   Joseph Art, DO  pantoprazole (PROTONIX) 40 MG tablet Take 1 tablet (40 mg total) by mouth daily before breakfast. 09/24/20   Heather Roberts, NP  potassium chloride (KLOR-CON) 10 MEQ tablet Take 20 mEq by mouth every morning. 01/22/21   [provider]  prochlorperazine (COMPAZINE) 5 MG tablet Take 1 tablet (5 mg total) by mouth every 6 (six) hours as needed for nausea or vomiting. 02/20/21   Joseph Art, DO  propranolol (INDERAL) 20 MG tablet Take 10 mg by mouth 2 (two) times daily. 01/22/21   [provider]  Vitamin D, Cholecalciferol, 10 MCG (400 UNIT) TABS Take 400 Units by mouth at bedtime.    [provider]    Family History Family History  Problem Relation Age of Onset   Depression Mother    Depression Father    Depression Sister    Anxiety disorder Sister    Anxiety disorder Sister    Alcohol abuse Neg Hx    Drug abuse Neg Hx     Social History Social History   Tobacco Use   Smoking status: Former    Packs/day: 0.25    Years: 50.00    Additional pack years: 0.00    Total pack years: 12.50    Types: Cigarettes    Start date: 06/02/2020   Smokeless tobacco: Never  Substance Use Topics   Alcohol use: No    Alcohol/week: 0.0 standard drinks of alcohol   Drug use: No     Allergies   Codeine and Prednisone   Review of Systems Review of Systems Per HPI  Physical Exam Triage Vital Signs ED Triage Vitals  Enc Vitals Group     BP 12/06/22 1352 116/74     Pulse Rate 12/06/22 1352 73     Resp 12/06/22 1352 18     Temp 12/06/22 1352 98.1 F (36.7 C)     Temp Source 12/06/22 1352 Oral     SpO2 12/06/22 1352 96 %     Weight --      Height --      Head Circumference --      Peak Flow --      Pain Score 12/06/22 1354 0     Pain Loc --      Pain Edu? --      Excl. in GC? --    No data found.  Updated Vital Signs BP 116/74 (BP  Location: Right Arm)   Pulse 73   Temp 98.1 F (36.7 C) (Oral)   Resp 18   SpO2 96%   Visual Acuity Right Eye Distance:   Left Eye Distance:   Bilateral Distance:    Right  Eye Near:   Left Eye Near:    Bilateral Near:     Physical Exam Vitals and nursing note reviewed.  Constitutional:      General: She is not in acute distress.    Appearance: Normal appearance. She is not toxic-appearing.  HENT:     Head: Normocephalic and atraumatic.     Mouth/Throat:     Mouth: Mucous membranes are moist.     Pharynx: Oropharynx is clear.  Cardiovascular:     Rate and Rhythm: Normal rate and regular rhythm.  Pulmonary:     Effort: Pulmonary effort is normal. No respiratory distress.  Musculoskeletal:     Cervical back: Normal range of motion.     Right lower leg: 3+ Edema present.     Left lower leg: No edema.     Comments: Positive Denna Haggard' sign right lower extremity.  No erythema, warmth, red streaking.  Right lower extremity is neurovascularly intact.  Lymphadenopathy:     Cervical: No cervical adenopathy.  Skin:    General: Skin is warm and dry.     Capillary Refill: Capillary refill takes less than 2 seconds.     Coloration: Skin is not jaundiced or pale.     Findings: No erythema or rash.  Neurological:     Mental Status: She is alert and oriented to person, place, and time.  Psychiatric:        Behavior: Behavior is cooperative.      UC Treatments / Results  Labs (all labs ordered are listed, but only abnormal results are displayed) Labs Reviewed - No data to display  EKG   Radiology No results found.  Procedures Procedures (including critical care time)  Medications Ordered in UC Medications - No data to display  Initial Impression / Assessment and Plan / UC Course  I have reviewed the triage vital signs and the nursing notes.  Pertinent labs & imaging results that were available during my care of the patient were reviewed by me and considered in my  medical decision making (see chart for details).   Patient is well-appearing, normotensive, afebrile, not tachycardic, not tachypneic, oxygenating well on room air.    1. Pain and swelling of right lower extremity Outpatient ultrasound ordered of right lower extremity to rule out DVT If positive, will refer to DVT clinic given history of CKD stage III If negative, recommend follow-up with PCP for further evaluation/management  The patient was given the opportunity to ask questions.  All questions answered to their satisfaction.  The patient is in agreement to this plan.    Final Clinical Impressions(s) / UC Diagnoses   Final diagnoses:  Pain and swelling of right lower extremity     Discharge Instructions      Please go to [facility] as scheduled to have the ultrasound of your right leg.  As we discussed, I am concerned you may have a blood clot in that leg.   If so, we can get you in with the DVT clinic in [place].  If the ultrasound is negative, recommend follow up with PCP for further evaluation/management.    ED Prescriptions   None    PDMP not reviewed this encounter.   Valentino Nose, NP 12/06/22 1423

## 2022-12-06 NOTE — Telephone Encounter (Signed)
Provider Anderson Malta. went over pts test results and pt was not able to answer her call at that time, I called pt back to inform her of results of DVT workup. Informed of negative test for DVT and to follow up with her PCP. Pt verbalized understanding and will follow up with PCP.

## 2022-12-07 ENCOUNTER — Ambulatory Visit
Admission: EM | Admit: 2022-12-07 | Discharge: 2022-12-07 | Disposition: A | Discharge status: Home / Self Care | Payer: PPO

## 2022-12-07 ENCOUNTER — Other Ambulatory Visit: Payer: Self-pay

## 2022-12-07 ENCOUNTER — Encounter: Payer: Self-pay | Admitting: Emergency Medicine

## 2022-12-07 DIAGNOSIS — M7989 Other specified soft tissue disorders: Secondary | ICD-10-CM | POA: Diagnosis not present

## 2022-12-07 NOTE — ED Triage Notes (Signed)
Pt reports continued RLE swelling x1 month. Pt seen for same yesterday.

## 2022-12-07 NOTE — ED Provider Notes (Signed)
[facility]    CSN: [id number] Arrival date & time: 12/07/22  4098      History   Chief Complaint Chief Complaint  Patient presents with   Leg Swelling    HPI [name] is a 78 y.o. female.   Patient presents today with continued right lower extremity swelling.  She continues denies chest pain or shortness of breath.  She was seen by me in urgent care yesterday where we did an ultrasound of the right lower extremity that was negative for DVT.  Reports the swelling is no better and she wants it to be fixed.  She is requesting a "water pill."  She has not contacted her primary care provider.    Past Medical History:  Diagnosis Date   Anxiety    Back pain    Fibromyalgia    Hyperlipemia    Hypothyroidism    Neuropathy    Sinusitis    Stroke [facility])    no deficits    Patient Active Problem List   Diagnosis Date Noted   Nausea & vomiting 02/16/2021   Dehydration 02/16/2021   Prolonged QT interval 02/16/2021   Generalized weakness 02/16/2021   Neuropathy    GERD (gastroesophageal reflux disease)    Hypokalemia 02/15/2021   Hyperlipidemia 07/16/2020   Chronic kidney disease, stage III (moderate) (HCC) 07/16/2020   PE (pulmonary thromboembolism) (HCC) 05/27/2019   Esophageal dysphagia 08/10/2018   Back pain 05/05/2012   Fibromyalgia 05/05/2012   Chest pressure 04/03/2012   Carotid bruit 04/03/2012   Anxiety 10/05/2011    Past Surgical History:  Procedure Laterality Date   ABDOMINAL HYSTERECTOMY     BIOPSY  08/25/2018   Procedure: BIOPSY;  Surgeon: Malissa Hippo, MD;  [facility];  Service: Endoscopy;;   CATARACT EXTRACTION Bilateral    CHOLECYSTECTOMY     ESOPHAGEAL DILATION N/A 06/27/2015   Procedure: ESOPHAGEAL DILATION;  Surgeon: Malissa Hippo, MD;  [facility];  Service: Endoscopy;  Laterality: N/A;   ESOPHAGEAL DILATION N/A 08/25/2018   Procedure: ESOPHAGEAL DILATION;  Surgeon: Malissa Hippo, MD;  [facility];  Service: Endoscopy;  Laterality: N/A;   ESOPHAGOGASTRODUODENOSCOPY (EGD) WITH PROPOFOL N/A 06/27/2015   Procedure: ESOPHAGOGASTRODUODENOSCOPY (EGD) WITH PROPOFOL;  Surgeon: Malissa Hippo, MD;  [facility];  Service: Endoscopy;  Laterality: N/A;   ESOPHAGOGASTRODUODENOSCOPY (EGD) WITH PROPOFOL N/A 08/25/2018   Procedure: ESOPHAGOGASTRODUODENOSCOPY (EGD) WITH PROPOFOL;  Surgeon: Malissa Hippo, MD;  [facility];  Service: Endoscopy;  Laterality: N/A;  9:30    OB History   No obstetric history on file.      Home Medications    Prior to Admission medications   Medication Sig Start Date End Date Taking? Authorizing Provider  acetaminophen (TYLENOL) 500 MG tablet Take 2 tablets (1,000 mg total) by mouth every 6 (six) hours as needed for mild pain (or Fever >/= 101). 02/20/21   Marlin Canary U, DO  amLODipine (NORVASC) 5 MG tablet Take 1 tablet (5 mg total) by mouth daily. 02/21/21   Joseph Art, DO  atorvastatin (LIPITOR) 20 MG tablet Take 1 tablet (20 mg total) by mouth at bedtime. 02/20/21   Joseph Art, DO  Blood Pressure Monitoring (B-D ASSURE BPM/AUTO ARM CUFF) MISC Check BP daily and bring log to PCP 02/20/21   Joseph Art, DO  FLUoxetine (PROZAC) 10 MG capsule Take 1 capsule (10 mg total) by mouth daily. 02/21/21   Benjamine Mola,  Madesyn Ast U, DO  gabapentin (NEURONTIN) 100 MG capsule Take 100 mg by mouth 3 (three) times daily.  Patient not taking: No sig reported    [provider]  hydrOXYzine (ATARAX/VISTARIL) 25 MG tablet Take 1 tablet (25 mg total) by mouth 4 (four) times daily. 02/20/21   Marlin Canary U, DO  melatonin 5 MG TABS Take 1 tablet (5 mg total) by mouth at bedtime as needed (insomnia). 02/20/21   Joseph Art, DO  methocarbamol (ROBAXIN) 500 MG tablet Take 1 tablet (500 mg total) by mouth every 8 (eight) hours as needed for muscle spasms. 02/20/21   Joseph Art, DO  pantoprazole (PROTONIX) 40 MG tablet Take 1 tablet (40 mg total)  by mouth daily before breakfast. 09/24/20   Heather Roberts, NP  potassium chloride (KLOR-CON) 10 MEQ tablet Take 20 mEq by mouth every morning. 01/22/21   [provider]  prochlorperazine (COMPAZINE) 5 MG tablet Take 1 tablet (5 mg total) by mouth every 6 (six) hours as needed for nausea or vomiting. 02/20/21   Joseph Art, DO  propranolol (INDERAL) 20 MG tablet Take 10 mg by mouth 2 (two) times daily. 01/22/21   [provider]  Vitamin D, Cholecalciferol, 10 MCG (400 UNIT) TABS Take 400 Units by mouth at bedtime.    [provider]    Family History Family History  Problem Relation Age of Onset   Depression Mother    Depression Father    Depression Sister    Anxiety disorder Sister    Anxiety disorder Sister    Alcohol abuse Neg Hx    Drug abuse Neg Hx     Social History Social History   Tobacco Use   Smoking status: Former    Packs/day: 0.25    Years: 50.00    Additional pack years: 0.00    Total pack years: 12.50    Types: Cigarettes    Start date: 06/02/2020   Smokeless tobacco: Never  Substance Use Topics   Alcohol use: No    Alcohol/week: 0.0 standard drinks of alcohol   Drug use: No     Allergies   Codeine and Prednisone   Review of Systems Review of Systems Per HPI  Physical Exam Triage Vital Signs ED Triage Vitals [12/07/22 0926]  Enc Vitals Group     BP 108/65     Pulse Rate 83     Resp 20     Temp 97.8 F (36.6 C)     Temp Source Oral     SpO2 93 %     Weight      Height      Head Circumference      Peak Flow      Pain Score 10     Pain Loc      Pain Edu?      Excl. in GC?    No data found.  Updated Vital Signs BP 108/65 (BP Location: Right Arm)   Pulse 83   Temp 97.8 F (36.6 C) (Oral)   Resp 20   SpO2 93%   Visual Acuity Right Eye Distance:   Left Eye Distance:   Bilateral Distance:    Right Eye Near:   Left Eye Near:    Bilateral Near:     Physical Exam Vitals and nursing note reviewed.   Constitutional:      General: She is not in acute distress.    Appearance: Normal appearance. She is not toxic-appearing.  HENT:  Head: Normocephalic and atraumatic.     Mouth/Throat:     Mouth: Mucous membranes are moist.     Pharynx: Oropharynx is clear.  Cardiovascular:     Rate and Rhythm: Normal rate and regular rhythm.  Pulmonary:     Effort: Pulmonary effort is normal. No respiratory distress.  Skin:    General: Skin is warm and dry.     Capillary Refill: Capillary refill takes less than 2 seconds.  Neurological:     Mental Status: She is alert and oriented to person, place, and time.  Psychiatric:        Behavior: Behavior is cooperative.      UC Treatments / Results  Labs (all labs ordered are listed, but only abnormal results are displayed) Labs Reviewed - No data to display  EKG   Radiology US Venous Img Lower Unilateral Right (DVT)  Result Date: 12/06/2022 CLINICAL DATA:  Right lower extremity swelling. EXAM: Right LOWER EXTREMITY VENOUS DOPPLER ULTRASOUND TECHNIQUE: Gray-scale sonography with compression, as well as color and duplex ultrasound, were performed to evaluate the deep venous system(s) from the level of the common femoral vein through the popliteal and proximal calf veins. COMPARISON:  Ultrasound dated 05/28/2019. FINDINGS: VENOUS Normal compressibility of the common femoral, superficial femoral, and popliteal veins, as well as the visualized calf veins. Visualized portions of profunda femoral vein and great saphenous vein unremarkable. No filling defects to suggest DVT on grayscale or color Doppler imaging. Doppler waveforms show normal direction of venous flow, normal respiratory plasticity and response to augmentation. Limited views of the contralateral common femoral vein are unremarkable. OTHER There is mild subcutaneous edema of the right ankle. Limitations: none IMPRESSION: No evidence of DVT in the right lower extremity. Electronically Signed    By: Elgie Collard M.D.   On: 12/06/2022 15:33    Procedures Procedures (including critical care time)  Medications Ordered in UC Medications - No data to display  Initial Impression / Assessment and Plan / UC Course  I have reviewed the triage vital signs and the nursing notes.  Pertinent labs & imaging results that were available during my care of the patient were reviewed by me and considered in my medical decision making (see chart for details).   Patient is well-appearing, normotensive, afebrile, not tachycardic, not tachypneic, oxygenating well on room air.    1. Swelling of right lower extremity Offered patient an Ace wrap today, however she declines  I discussed with patient that it is not appropriate for me to prescribe as I do not know most recent kidney function and electrolyte levels I recommended follow-up with her PCP Patient frustrated that I would not prescribe her a water pill, however daughter verbalizes understanding and reports she will contact PCP office to get her an appointment  The patient was given the opportunity to ask questions.  All questions answered to their satisfaction.  The patient is in agreement to this plan.    Final Clinical Impressions(s) / UC Diagnoses   Final diagnoses:  Swelling of right lower extremity     Discharge Instructions      Please follow-up with your primary care provider to discuss your right lower extremity swelling.  You declined the Ace wrap that I recommended today.   ED Prescriptions   None    PDMP not reviewed this encounter.   Valentino Nose, NP 12/07/22 [phone]

## 2022-12-07 NOTE — Discharge Instructions (Signed)
Please follow-up with your primary care provider to discuss your right lower extremity swelling.  You declined the Ace wrap that I recommended today.

## 2022-12-26 ENCOUNTER — Other Ambulatory Visit: Payer: Self-pay

## 2022-12-26 ENCOUNTER — Encounter (HOSPITAL_COMMUNITY): Payer: Self-pay | Admitting: Emergency Medicine

## 2022-12-26 ENCOUNTER — Emergency Department (HOSPITAL_COMMUNITY)
Admission: EM | Admit: 2022-12-26 | Discharge: 2022-12-26 | Disposition: A | Discharge status: Home / Self Care | Payer: PPO | Attending: Emergency Medicine | Admitting: Emergency Medicine

## 2022-12-26 DIAGNOSIS — Z87891 Personal history of nicotine dependence: Secondary | ICD-10-CM | POA: Insufficient documentation

## 2022-12-26 DIAGNOSIS — E039 Hypothyroidism, unspecified: Secondary | ICD-10-CM | POA: Diagnosis not present

## 2022-12-26 DIAGNOSIS — Z79899 Other long term (current) drug therapy: Secondary | ICD-10-CM | POA: Insufficient documentation

## 2022-12-26 DIAGNOSIS — M7989 Other specified soft tissue disorders: Secondary | ICD-10-CM | POA: Diagnosis present

## 2022-12-26 DIAGNOSIS — R600 Localized edema: Secondary | ICD-10-CM | POA: Insufficient documentation

## 2022-12-26 LAB — CBC WITH DIFFERENTIAL/PLATELET
Abs Immature Granulocytes: 0.03 10*3/uL (ref 0.00–0.07)
Basophils Absolute: 0.1 10*3/uL (ref 0.0–0.1)
Basophils Relative: 1 %
Eosinophils Absolute: 0.2 10*3/uL (ref 0.0–0.5)
Eosinophils Relative: 2 %
HCT: 35.4 % — ABNORMAL LOW (ref 36.0–46.0)
Hemoglobin: 11.5 g/dL — ABNORMAL LOW (ref 12.0–15.0)
Immature Granulocytes: 0 %
Lymphocytes Relative: 20 %
Lymphs Abs: 1.7 10*3/uL (ref 0.7–4.0)
MCH: 30.7 pg (ref 26.0–34.0)
MCHC: 32.5 g/dL (ref 30.0–36.0)
MCV: 94.7 fL (ref 80.0–100.0)
Monocytes Absolute: 0.8 10*3/uL (ref 0.1–1.0)
Monocytes Relative: 9 %
Neutro Abs: 5.8 10*3/uL (ref 1.7–7.7)
Neutrophils Relative %: 68 %
Platelets: 208 10*3/uL (ref 150–400)
RBC: 3.74 MIL/uL — ABNORMAL LOW (ref 3.87–5.11)
RDW: 15.4 % (ref 11.5–15.5)
WBC: 8.6 10*3/uL (ref 4.0–10.5)
nRBC: 0 % (ref 0.0–0.2)

## 2022-12-26 LAB — COMPREHENSIVE METABOLIC PANEL
ALT: 16 U/L (ref 0–44)
AST: 14 U/L — ABNORMAL LOW (ref 15–41)
Albumin: 3.4 g/dL — ABNORMAL LOW (ref 3.5–5.0)
Alkaline Phosphatase: 63 U/L (ref 38–126)
Anion gap: 10 (ref 5–15)
BUN: 15 mg/dL (ref 8–23)
CO2: 23 mmol/L (ref 22–32)
Calcium: 8.6 mg/dL — ABNORMAL LOW (ref 8.9–10.3)
Chloride: 106 mmol/L (ref 98–111)
Creatinine, Ser: 1.2 mg/dL — ABNORMAL HIGH (ref 0.44–1.00)
GFR, Estimated: 47 mL/min — ABNORMAL LOW (ref >60)
Glucose, Bld: 84 mg/dL (ref 70–99)
Potassium: 3.9 mmol/L (ref 3.5–5.1)
Sodium: 139 mmol/L (ref 135–145)
Total Bilirubin: 0.2 mg/dL — ABNORMAL LOW (ref 0.3–1.2)
Total Protein: 6.5 g/dL (ref 6.5–8.1)

## 2022-12-26 MED ORDER — HYDROCODONE-ACETAMINOPHEN 5-325 MG PO TABS: 2.0000 | ORAL_TABLET | Freq: Four times a day (QID) | ORAL | 0 refills | Status: AC | PRN

## 2022-12-26 MED ORDER — SODIUM CHLORIDE 0.9 % IV BOLUS
500.0000 mL | Freq: Once | INTRAVENOUS | Status: AC
  Administered 2022-12-26: 500 mL via INTRAVENOUS

## 2022-12-26 NOTE — ED Provider Notes (Signed)
[place] EMERGENCY DEPARTMENT AT [facility] Provider Note   CSN: [id number] Arrival date & time: 12/26/22  1148     History  Chief Complaint  Patient presents with   Leg Swelling    [name] is a 77 y.o. female.  HPI   77 year old female presents emergency department complaints of right lower extremity swelling and tightness type feeling.  Patient states that she has had symptoms for the past month.  Was seen with negative ultrasound for DVT on 24 June but states that swelling has persisted despite trial of elevation of her right leg at home.  States that she has not wanted to wear compression socks/stockings at home due to discomfort.  Denies any known injury to leg.  Denies any joint pain.  Denies any fever, chills, chest pain, shortness of breath.  Denies any history of DVT/PE, recent surgery/immobilization, known malignancy, known coagulopathy, hormonal therapy.  Upon further chart review, patient does have history of PE and is not currently anticoagulated.  Past medical history significant for hyperlipidemia, hypothyroidism, CVA, fibromyalgia  Home Medications Prior to Admission medications   Medication Sig Start Date End Date Taking? Authorizing Provider  HYDROcodone-acetaminophen (NORCO/VICODIN) 5-325 MG tablet Take 2 tablets by mouth every 6 (six) hours as needed for up to 3 days. 12/26/22 12/29/22 Yes Sherian Maroon A, PA  acetaminophen (TYLENOL) 500 MG tablet Take 2 tablets (1,000 mg total) by mouth every 6 (six) hours as needed for mild pain (or Fever >/= 101). 02/20/21   Marlin Canary U, DO  amLODipine (NORVASC) 5 MG tablet Take 1 tablet (5 mg total) by mouth daily. 02/21/21   Joseph Art, DO  atorvastatin (LIPITOR) 20 MG tablet Take 1 tablet (20 mg total) by mouth at bedtime. 02/20/21   Joseph Art, DO  Blood Pressure Monitoring (B-D ASSURE BPM/AUTO ARM CUFF) MISC Check BP daily and bring log to PCP 02/20/21   Joseph Art, DO  FLUoxetine (PROZAC) 10  MG capsule Take 1 capsule (10 mg total) by mouth daily. 02/21/21   Joseph Art, DO  gabapentin (NEURONTIN) 100 MG capsule Take 100 mg by mouth 3 (three) times daily.  Patient not taking: No sig reported    [provider]  hydrOXYzine (ATARAX/VISTARIL) 25 MG tablet Take 1 tablet (25 mg total) by mouth 4 (four) times daily. 02/20/21   Marlin Canary U, DO  melatonin 5 MG TABS Take 1 tablet (5 mg total) by mouth at bedtime as needed (insomnia). 02/20/21   Joseph Art, DO  methocarbamol (ROBAXIN) 500 MG tablet Take 1 tablet (500 mg total) by mouth every 8 (eight) hours as needed for muscle spasms. 02/20/21   Joseph Art, DO  pantoprazole (PROTONIX) 40 MG tablet Take 1 tablet (40 mg total) by mouth daily before breakfast. 09/24/20   Heather Roberts, NP  potassium chloride (KLOR-CON) 10 MEQ tablet Take 20 mEq by mouth every morning. 01/22/21   [provider]  prochlorperazine (COMPAZINE) 5 MG tablet Take 1 tablet (5 mg total) by mouth every 6 (six) hours as needed for nausea or vomiting. 02/20/21   Joseph Art, DO  propranolol (INDERAL) 20 MG tablet Take 10 mg by mouth 2 (two) times daily. 01/22/21   [provider]  Vitamin D, Cholecalciferol, 10 MCG (400 UNIT) TABS Take 400 Units by mouth at bedtime.    [provider]      Allergies    Codeine and Prednisone    Review  of Systems   Review of Systems  All other systems reviewed and are negative.   Physical Exam Updated Vital Signs BP 108/72 (BP Location: Right Arm)   Pulse 68   Temp 98.9 F (37.2 C) (Oral)   Resp 16   Ht 5\' 3"  (1.6 m)   Wt 62.1 kg   SpO2 95%   BMI 24.27 kg/m  Physical Exam Vitals and nursing note reviewed.  Constitutional:      General: She is not in acute distress.    Appearance: She is well-developed.  HENT:     Head: Normocephalic and atraumatic.  Eyes:     Conjunctiva/sclera: Conjunctivae normal.  Cardiovascular:     Rate and Rhythm: Normal rate and regular rhythm.   Pulmonary:     Effort: Pulmonary effort is normal. No respiratory distress.     Breath sounds: Normal breath sounds.  Abdominal:     Palpations: Abdomen is soft.     Tenderness: There is no abdominal tenderness.  Musculoskeletal:        General: No swelling.     Cervical back: Neck supple.     Right lower leg: Edema present.     Left lower leg: No edema.     Comments: 2+ pitting edema right lower extremity.  Pedal and posterior tibial pulses 2+ bilaterally.  No overlying erythema, palpable lection/induration.  Compartments soft and supple.  Full range of motion of hip, knee, ankle, digits on affected extremity without pain.  Skin:    General: Skin is warm and dry.     Capillary Refill: Capillary refill takes less than 2 seconds.  Neurological:     Mental Status: She is alert.  Psychiatric:        Mood and Affect: Mood normal.     ED Results / Procedures / Treatments   Labs (all labs ordered are listed, but only abnormal results are displayed) Labs Reviewed  COMPREHENSIVE METABOLIC PANEL - Abnormal; Notable for the following components:      Result Value   Creatinine, Ser 1.20 (*)    Calcium 8.6 (*)    Albumin 3.4 (*)    AST 14 (*)    Total Bilirubin 0.2 (*)    GFR, Estimated 47 (*)    All other components within normal limits  CBC WITH DIFFERENTIAL/PLATELET - Abnormal; Notable for the following components:   RBC 3.74 (*)    Hemoglobin 11.5 (*)    HCT 35.4 (*)    All other components within normal limits    EKG None  Radiology No results found.  Procedures Procedures    Medications Ordered in ED Medications  sodium chloride 0.9 % bolus 500 mL (0 mLs Intravenous Stopped 12/26/22 1609)    ED Course/ Medical Decision Making/ A&P                             Medical Decision Making Amount and/or Complexity of Data Reviewed Labs: ordered.   This patient presents to the ED for concern of leg swelling, this involves an extensive number of treatment options,  and is a complaint that carries with it a high risk of complications and morbidity.  The differential diagnosis includes DVT, PE, CHF, fracture, strain/sprain, dislocation, cellulitis, erysipelas, necrotizing fasciitis, compartment syndrome, electrolyte derangement, hypoalbuminemia   Co morbidities that complicate the patient evaluation  See HPI   Additional history obtained:  Additional history obtained from EMR External records from outside source obtained  and reviewed including hospital records   Lab Tests:  I Ordered, and personally interpreted labs.  The pertinent results include: No leukocytosis.  Evidence of anemia with hemoglobin 11.5 which is patient's baseline.  No electrolyte abnormalities besides mild HYPOCALCEMIA of 8.6.  Patient with renal dysfunction with creatinine 1.2, BUN of 15 and GFR of 47   Imaging Studies ordered:  Ultrasound unavailable  Cardiac Monitoring: / EKG:  The patient was maintained on a cardiac monitor.  I personally viewed and interpreted the cardiac monitored which showed an underlying rhythm of: Sinus rhythm   Consultations Obtained:  N/a   Problem List / ED Course / Critical interventions / Medication management  Right lower extremity swelling I ordered medication including 100 cc normal saline  Reevaluation of the patient after these medicines showed that the patient improved I have reviewed the patients home medicines and have made adjustments as needed   Social Determinants of Health:  Former cigarette use.  Denies illicit drug use.   Test / Admission - Considered:  Right lower extremity swelling Vitals signs within normal range and stable throughout visit. Laboratory studies significant for: See above 77 year old female presents emergency department with complaints of right lower extremity swelling over the past month.  Patient states that her at home elevation of her extremity has not helped to completely resolve symptoms.   Reports history of PE but is not currently anticoagulated.  Patient without overlying erythema, indurated tissue; low suspicion for cellulitis, erysipelas, necrotizing fasciitis.  Patient without pulse deficits with low suspicion for ischemic limb/intermittent claudication.  No traumatic injury and without bony tenderness to palpation so low suspicion for acute fracture/dislocation.  Patient symptoms as well as history most concerning for DVT.  Patient without evidence of tachycardia, hypoxia, complaints of chest pain or shortness of breath so low suspicion for PE.  Today, ultrasound unavailable for DVT rule out.  Will schedule ultrasound for tomorrow for DVT rule out.  Regarding patient's laboratory studies, was with elevation of creatinine of 1.2, GFR 47 which is slightly worsened from laboratory studies approximately 1 year ago; patient states that she had laboratory studies approximate 2 months ago and was referred by primary care to nephrology due to concern for kidney injury; she thinks that current kidney function is about where it was at when her primary care last drew labs.  Will recommend follow-up tomorrow for ultrasound.  Treatment plan discussed at length with patient and she acknowledged understand was agreeable to said plan.  Patient overall well-appearing, afebrile in no acute distress.   Worrisome signs and symptoms were discussed with the patient, and the patient acknowledged understanding to return to the ED if noticed. Patient was stable upon discharge.          Final Clinical Impression(s) / ED Diagnoses Final diagnoses:  Peripheral edema    Rx / DC Orders ED Discharge Orders     None         Peter Garter, [place] 12/26/22 1731    Bethann Berkshire, MD 12/27/22 1648

## 2022-12-26 NOTE — Discharge Instructions (Addendum)
As discussed, we are unable to perform ultrasound at this time given lack of availability on the weekend.  I will schedule ultrasound tomorrow for blood clot rule out in your leg.  Please wear compression stockings as well as elevate leg above the level of your heart.  Return to emergency department if you develop fever, chest pain, shortness of breath, intractable pain.  Otherwise, return for ultrasound tomorrow.

## 2022-12-26 NOTE — ED Triage Notes (Signed)
Pt via POV c/o continued RLE edema x over 1 month, seen multiple times recently for same. Pt received an injection in her right knee about 2 weeks ago that did not improve her symptoms. Pt a/o x 4 and ambulatory on arrival. Significant swelling and pale coloration noted to right lower leg.

## 2022-12-27 ENCOUNTER — Ambulatory Visit (HOSPITAL_COMMUNITY)
Admission: RE | Admit: 2022-12-27 | Discharge: 2022-12-27 | Disposition: A | Discharge status: Home / Self Care | Payer: PPO | Source: Ambulatory Visit | Attending: Emergency Medicine | Admitting: Emergency Medicine

## 2022-12-27 DIAGNOSIS — R609 Edema, unspecified: Secondary | ICD-10-CM | POA: Insufficient documentation

## 2023-03-10 ENCOUNTER — Ambulatory Visit (INDEPENDENT_AMBULATORY_CARE_PROVIDER_SITE_OTHER): Payer: PPO | Admitting: Family Medicine

## 2023-03-10 ENCOUNTER — Encounter: Payer: Self-pay | Admitting: Family Medicine

## 2023-03-10 VITALS — BP 109/78 | HR 95 | Resp 16 | Ht 63.0 in | Wt 133.1 lb

## 2023-03-10 DIAGNOSIS — E559 Vitamin D deficiency, unspecified: Secondary | ICD-10-CM | POA: Diagnosis not present

## 2023-03-10 DIAGNOSIS — Z1329 Encounter for screening for other suspected endocrine disorder: Secondary | ICD-10-CM

## 2023-03-10 DIAGNOSIS — E538 Deficiency of other specified B group vitamins: Secondary | ICD-10-CM

## 2023-03-10 DIAGNOSIS — E782 Mixed hyperlipidemia: Secondary | ICD-10-CM | POA: Diagnosis not present

## 2023-03-10 DIAGNOSIS — F419 Anxiety disorder, unspecified: Secondary | ICD-10-CM | POA: Diagnosis not present

## 2023-03-10 DIAGNOSIS — K219 Gastro-esophageal reflux disease without esophagitis: Secondary | ICD-10-CM

## 2023-03-10 DIAGNOSIS — F32A Depression, unspecified: Secondary | ICD-10-CM

## 2023-03-10 DIAGNOSIS — Z131 Encounter for screening for diabetes mellitus: Secondary | ICD-10-CM

## 2023-03-10 DIAGNOSIS — Z13820 Encounter for screening for osteoporosis: Secondary | ICD-10-CM

## 2023-03-10 MED ORDER — BUPROPION HCL ER (XL) 150 MG PO TB24
150.0000 mg | ORAL_TABLET | Freq: Every morning | ORAL | 2 refills | Status: DC
Start: 2023-03-10 — End: 2023-04-21

## 2023-03-10 MED ORDER — DIAZEPAM 5 MG PO TABS: 5.0000 mg | ORAL_TABLET | Freq: Two times a day (BID) | ORAL | 0 refills | Status: DC | PRN

## 2023-03-10 MED ORDER — AMITRIPTYLINE HCL 50 MG PO TABS
50.0000 mg | ORAL_TABLET | Freq: Every day | ORAL | 2 refills | Status: DC
Start: 2023-03-10 — End: 2023-04-21

## 2023-03-10 MED ORDER — PANTOPRAZOLE SODIUM 40 MG PO TBEC
40.0000 mg | DELAYED_RELEASE_TABLET | Freq: Once | ORAL | 2 refills | Status: DC | PRN
Start: 2023-03-10 — End: 2023-04-21
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
## 2023-03-10 NOTE — Assessment & Plan Note (Signed)
Flowsheet Row Office Visit from 03/10/2023 in [facility]  PHQ-9 Total Score 9          03/10/2023    3:15 PM 06/23/2020    1:20 PM  GAD 7 : Generalized Anxiety Score  Nervous, Anxious, on Edge 2 3  Control/stop worrying 1 3  Worry too much - different things 2 3  Trouble relaxing 1 3  Restless 1 3  Easily annoyed or irritable 1 1  Afraid - awful might happen 0 1  Total GAD 7 Score 8 17  Anxiety Difficulty Somewhat difficult Very difficult  Chronic condition , patient does not want psych referral Patient reports taking amitriptyline 50 mg once daily, Wellbutrin 150 mg daily and Diazepam 5 mg PRN Continued discussion on lifestyle changes, establishing a daily routine, going outdoors, exercise, healthy eating habits, mindfulness and mediatation.

## 2023-03-10 NOTE — Assessment & Plan Note (Signed)
Protonix 40 mg PRN Explained to patient lifestyle modifications, weight loss, smoking cessation, loose clothing, and avoiding caffeine and trigger foods. Follow up in 6 weeks, if symptoms persist possible referral to EGD

## 2023-03-10 NOTE — Patient Instructions (Signed)

## 2023-03-17 ENCOUNTER — Other Ambulatory Visit (HOSPITAL_COMMUNITY): Payer: PPO

## 2023-04-09 LAB — CBC WITH DIFFERENTIAL/PLATELET
Basophils Absolute: 0.1 10*3/uL (ref 0.0–0.2)
Basos: 1 %
EOS (ABSOLUTE): 0.1 10*3/uL (ref 0.0–0.4)
Eos: 2 %
Hematocrit: 41.5 % (ref 34.0–46.6)
Hemoglobin: 13.7 g/dL (ref 11.1–15.9)
Immature Grans (Abs): 0 10*3/uL (ref 0.0–0.1)
Immature Granulocytes: 0 %
Lymphocytes Absolute: 1.8 10*3/uL (ref 0.7–3.1)
Lymphs: 25 %
MCH: 31 pg (ref 26.6–33.0)
MCHC: 33 g/dL (ref 31.5–35.7)
MCV: 94 fL (ref 79–97)
Monocytes Absolute: 0.5 10*3/uL (ref 0.1–0.9)
Monocytes: 7 %
Neutrophils Absolute: 4.6 10*3/uL (ref 1.4–7.0)
Neutrophils: 65 %
Platelets: 212 10*3/uL (ref 150–450)
RBC: 4.42 x10E6/uL (ref 3.77–5.28)
RDW: 13.2 % (ref 11.7–15.4)
WBC: 7.1 10*3/uL (ref 3.4–10.8)

## 2023-04-09 LAB — HEMOGLOBIN A1C
Est. average glucose Bld gHb Est-mCnc: 123 mg/dL
Hgb A1c MFr Bld: 5.9 % — ABNORMAL HIGH (ref 4.8–5.6)

## 2023-04-09 LAB — CMP14+EGFR
ALT: 13 [IU]/L (ref 0–32)
AST: 17 [IU]/L (ref 0–40)
Albumin: 4.3 g/dL (ref 3.8–4.8)
Alkaline Phosphatase: 94 [IU]/L (ref 44–121)
BUN/Creatinine Ratio: 7 — ABNORMAL LOW (ref 12–28)
BUN: 8 mg/dL (ref 8–27)
Bilirubin Total: 0.2 mg/dL (ref 0.0–1.2)
CO2: 23 mmol/L (ref 20–29)
Calcium: 9 mg/dL (ref 8.7–10.3)
Chloride: 104 mmol/L (ref 96–106)
Creatinine, Ser: 1.15 mg/dL — ABNORMAL HIGH (ref 0.57–1.00)
Globulin, Total: 2.3 g/dL (ref 1.5–4.5)
Glucose: 99 mg/dL (ref 70–99)
Potassium: 4.3 mmol/L (ref 3.5–5.2)
Sodium: 142 mmol/L (ref 134–144)
Total Protein: 6.6 g/dL (ref 6.0–8.5)
eGFR: 49 mL/min/{1.73_m2} — ABNORMAL LOW (ref >59)

## 2023-04-09 LAB — LIPID PANEL
Chol/HDL Ratio: 2.8 {ratio} (ref 0.0–4.4)
Cholesterol, Total: 165 mg/dL (ref 100–199)
HDL: 59 mg/dL (ref >39)
LDL Chol Calc (NIH): 78 mg/dL (ref 0–99)
Triglycerides: 166 mg/dL — ABNORMAL HIGH (ref 0–149)
VLDL Cholesterol Cal: 28 mg/dL (ref 5–40)

## 2023-04-09 LAB — B12 AND FOLATE PANEL
Folate: 15.4 ng/mL (ref >3.0)
Vitamin B-12: 572 pg/mL (ref 232–1245)

## 2023-04-09 LAB — TSH+FREE T4
Free T4: 1.02 ng/dL (ref 0.82–1.77)
TSH: 1.45 u[IU]/mL (ref 0.450–4.500)

## 2023-04-09 LAB — VITAMIN D 25 HYDROXY (VIT D DEFICIENCY, FRACTURES): Vit D, 25-Hydroxy: 34.9 ng/mL (ref 30.0–100.0)

## 2023-04-09 LAB — MICROALBUMIN / CREATININE URINE RATIO
Creatinine, Urine: 72.5 mg/dL
Microalb/Creat Ratio: 44 mg/g{creat} — ABNORMAL HIGH (ref 0–29)
Microalbumin, Urine: 32.2 ug/mL

## 2023-04-20 ENCOUNTER — Telehealth: Payer: Self-pay | Admitting: Family Medicine

## 2023-04-20 NOTE — Telephone Encounter (Signed)
Prescription Request  04/20/2023  LOV: 03/10/2023  What is the name of the medication or equipment? amitriptyline (ELAVIL) 50 MG tablet [[id number]] buPROPion (WELLBUTRIN XL) 150 MG 24 hr tablet [[id number]] diazepam (VALIUM) 5 MG tablet [[id number]] metoCLOPramide (REGLAN) 5 MG tablet [[id number]] pantoprazole (PROTONIX) 40 MG tablet [[id number]] Vitamin D, Cholecalciferol, 10 MCG (400 UNIT) TABS [[id number]] rosuvastatin (CRESTOR) 5 MG tablet [[id number]]   Have you contacted your pharmacy to request a refill? Yes   Which pharmacy would you like this sent to?  [facility] - [place], [place] - [address] [place] [address] Phone: [phone] Fax: [phone]    Patient notified that their request is being sent to the clinical staff for review and that they should receive a response within 2 business days.   Please advise at [facility] [phone]

## 2023-04-21 ENCOUNTER — Other Ambulatory Visit: Payer: Self-pay | Admitting: Family Medicine

## 2023-04-21 DIAGNOSIS — K219 Gastro-esophageal reflux disease without esophagitis: Secondary | ICD-10-CM

## 2023-04-21 DIAGNOSIS — F419 Anxiety disorder, unspecified: Secondary | ICD-10-CM

## 2023-04-21 DIAGNOSIS — F32A Depression, unspecified: Secondary | ICD-10-CM

## 2023-04-21 MED ORDER — AMITRIPTYLINE HCL 50 MG PO TABS
50.0000 mg | ORAL_TABLET | Freq: Every day | ORAL | 2 refills | Status: DC
Start: 2023-04-21 — End: 2023-11-04

## 2023-04-21 MED ORDER — BUPROPION HCL ER (XL) 150 MG PO TB24
150.0000 mg | ORAL_TABLET | Freq: Every morning | ORAL | 2 refills | Status: DC
Start: 2023-04-21 — End: 2023-08-09

## 2023-04-21 MED ORDER — METOCLOPRAMIDE HCL 5 MG PO TABS: 5.0000 mg | ORAL_TABLET | Freq: Two times a day (BID) | ORAL | 2 refills | Status: DC

## 2023-04-21 MED ORDER — DIAZEPAM 5 MG PO TABS: 5.0000 mg | ORAL_TABLET | Freq: Two times a day (BID) | ORAL | 0 refills | Status: DC | PRN

## 2023-04-21 MED ORDER — ROSUVASTATIN CALCIUM 5 MG PO TABS: 5.0000 mg | ORAL_TABLET | Freq: Every day | ORAL | 3 refills | Status: DC

## 2023-04-21 MED ORDER — PANTOPRAZOLE SODIUM 40 MG PO TBEC: 40.0000 mg | DELAYED_RELEASE_TABLET | Freq: Once | ORAL | 2 refills | Status: DC | PRN

## 2023-04-21 MED ORDER — VITAMIN D (CHOLECALCIFEROL) 10 MCG (400 UNIT) PO TABS: 400.0000 [IU] | ORAL_TABLET | Freq: Every day | ORAL | 3 refills | Status: AC

## 2023-04-21 NOTE — Telephone Encounter (Signed)
sent 

## 2023-05-11 ENCOUNTER — Other Ambulatory Visit: Payer: Self-pay | Admitting: Family Medicine

## 2023-05-11 ENCOUNTER — Telehealth: Payer: Self-pay

## 2023-05-11 MED ORDER — DIAZEPAM 5 MG PO TABS: 5.0000 mg | ORAL_TABLET | Freq: Two times a day (BID) | ORAL | 0 refills | Status: DC | PRN

## 2023-05-11 NOTE — Telephone Encounter (Signed)
Copied from CRM [id number]. Topic: Clinical - Medication Question >> May 11, 2023 10:55 AM Desma Mcgregor wrote: Reason for CRM: Pt's sister, Alvira Philips called in regarding diazepam (VALIUM) 5 MG tablets. She stated the pt will be out of this medication by the end of the week because she is currently to take 2 1/2 tabs a day, but was given only 60 quantity, allowing the pt to take 2 tabs a day. The quantity should have been 75 so the pt is missing 15 tablets, according to [place]. She mentioned that without this medication, the pt is shaky and her nerves are shot. Rx should be sent to [facility] - [place], [place] - [address] if approved. cb# [phone]

## 2023-05-11 NOTE — Telephone Encounter (Signed)
Sent in 15 tabs

## 2023-05-16 ENCOUNTER — Telehealth: Payer: Self-pay

## 2023-05-16 NOTE — Telephone Encounter (Signed)
Unfortunately patient will have to wait

## 2023-05-16 NOTE — Telephone Encounter (Signed)
Copied from CRM [id number]. Topic: Clinical - Prescription Issue >> May 16, 2023  1:40 PM Amy B wrote: Reason for CRM: Patient states she is out of Diazepam and the pharmacy will not refill it for four more days due to insurance.  She would like to know if you could provide her with samples to tie her over until she can get her refill.  She requests a call back to discuss, [phone].

## 2023-05-17 NOTE — Telephone Encounter (Signed)
Pt informed

## 2023-05-17 NOTE — Telephone Encounter (Signed)
  The question is will you fill it for the 30 day supply or are you wanting to keep it at the 15 day supply?    Copied from CRM [id number]. Topic: Clinical - Medication Question >> May 16, 2023  4:44 PM Alvino Blood C wrote: Reason for CRM: PT has ran out of  & pharmacy advised PT it's too soon to fill it.

## 2023-05-17 NOTE — Telephone Encounter (Signed)
It will be ready when refill is appropriate

## 2023-05-23 ENCOUNTER — Other Ambulatory Visit: Payer: Self-pay | Admitting: Family Medicine

## 2023-05-23 DIAGNOSIS — F32A Depression, unspecified: Secondary | ICD-10-CM

## 2023-05-23 MED ORDER — DIAZEPAM 5 MG PO TABS: 5.0000 mg | ORAL_TABLET | Freq: Two times a day (BID) | ORAL | 0 refills | Status: DC | PRN

## 2023-05-23 NOTE — Telephone Encounter (Signed)
Sent new script

## 2023-05-23 NOTE — Telephone Encounter (Unsigned)
Copied from CRM [id number]. Topic: Clinical - Medication Question >> May 23, 2023  4:02 PM Desma Mcgregor wrote: Reason for CRM: Pt's sister Alvira Philips calling in about the Pt's diazepam (VALIUM) 5 MG tablet. There is a discrepancy about the dosage and the directions. It appears the daughter is giving the Pt more than what is being prescribed (2 1/2 a day) and so the Pt is running out sooner than normal. The last fill of 15 tablets was just picked up on 12/06 and the sister is explaining that the daughter is giving her 2 a day. So she is going to run out once again before the next appt on 12/30. Alvira Philips is wondering if the nurse or Dr. Tenna Child can give her a call to get this all straightened out asap, as the Pt may not be fully aware of what's going on and understand (possible dementia).

## 2023-05-24 NOTE — Telephone Encounter (Signed)
Pt. Has been notified.

## 2023-06-03 ENCOUNTER — Ambulatory Visit: Payer: PPO | Admitting: Family Medicine

## 2023-06-09 NOTE — Patient Instructions (Signed)

## 2023-06-09 NOTE — Progress Notes (Signed)
Established Patient Office Visit   Subjective  Patient ID: [name], female    DOB: December 20, 1945  Age: 77 y.o. MRN: [id number]  Chief Complaint  Patient presents with   Care Management    3 month f/u, needs mediation for pain and her nerves. Also has a sinus infection that she has been fighting. Reports pain in her right hand.     She  has a past medical history of Anxiety, Back pain, Fibromyalgia, Hyperlipemia, Hypothyroidism, Neuropathy, Sinusitis, and Stroke (HCC).  Patient reports a recurrent sinus issue with the current episode beginning three months ago. Symptoms have been gradually improving over time. Pain is reported as severe, rated 9/10. Associated symptoms include facial pain, headaches, nasal congestion sinus pressure, and sneezing. Notable negative findings include the absence of fever, sore throat. Previous treatments, including oral decongestants, have provided only mild relief    Review of Systems  Constitutional:  Negative for chills and fever.  HENT:  Positive for congestion, ear pain and sinus pain. Negative for sore throat.   Respiratory:  Negative for shortness of breath.   Cardiovascular:  Negative for chest pain.  Gastrointestinal:  Negative for abdominal pain.  Musculoskeletal:  Positive for joint pain and myalgias.  Neurological:  Negative for headaches.      Objective:     BP 128/72 (BP Location: Left Arm)   Pulse 91   Ht 5\' 3"  (1.6 m)   Wt 128 lb 0.6 oz (58.1 kg)   SpO2 95%   BMI 22.68 kg/m  BP Readings from Last 3 Encounters:  06/10/23 128/72  03/10/23 109/78  12/26/22 108/72      Physical Exam Vitals reviewed.  Constitutional:      General: She is not in acute distress.    Appearance: Normal appearance. She is not ill-appearing, toxic-appearing or diaphoretic.  HENT:     Head: Normocephalic.     Right Ear: Tympanic membrane normal.     Left Ear: Tympanic membrane normal.     Nose: Nose normal.     Mouth/Throat:     Mouth:  Mucous membranes are moist.  Eyes:     General:        Right eye: No discharge.        Left eye: No discharge.     Conjunctiva/sclera: Conjunctivae normal.  Cardiovascular:     Rate and Rhythm: Normal rate.     Pulses: Normal pulses.     Heart sounds: Normal heart sounds.  Pulmonary:     Effort: Pulmonary effort is normal. No respiratory distress.     Breath sounds: Normal breath sounds.  Abdominal:     General: Bowel sounds are normal.     Palpations: Abdomen is soft.     Tenderness: There is no abdominal tenderness. There is no guarding.  Skin:    General: Skin is warm and dry.     Capillary Refill: Capillary refill takes less than 2 seconds.  Neurological:     Mental Status: She is alert.  Psychiatric:        Mood and Affect: Mood normal.      No results found for any visits on 06/10/23.  The ASCVD Risk score (Arnett DK, et al., 2019) failed to calculate for the following reasons:   Risk score cannot be calculated because patient has a medical history suggesting prior/existing ASCVD    Assessment & Plan:  Chronic maxillary sinusitis Assessment & Plan: Trial Xyzal 5 mg at bedtime  Discussed to use  saline nasal irrigation daily to clear mucus and reduce nasal irritation. Stay hydrated and use a humidifier to keep nasal passages moist, especially in dry environments. Avoid irritants like smoke, strong odors, and allergens, and practice good hygiene to reduce the risk of infections.  Orders: -     Levocetirizine Dihydrochloride; Take 1 tablet (5 mg total) by mouth every evening.  Dispense: 30 tablet; Refill: 2  Fibromyalgia Assessment & Plan: Patient is requesting a referral to a neurologist for evaluation of fibromyalgia. The last documented visit with a neurologist was in 2013  Orders: -     Ambulatory referral to Neurology  Stage 3a chronic kidney disease (HCC) Assessment & Plan: Recent eGFR 49 Dicussed to keep your cholesterol/ triglycerides levels under  control to prevent further damage to blood vessels. Avoid NSAIDs medications and take tylenol for pain management.Consume a kidney friendly diet which includes Veggies: cauliflower, onions, eggplant, turnips. Low sodium, low to moderate intake of proteins: lean meats (poultry, fish), eggs, unsalted seafood. Avoid fatty foods, limit or avoid smoking and alcohol intake. Maintain an excercise routine to minimum of 150 minuties a week. Follow up in 3-4 months to recheck labs.   -if further renal decline, will consider nephrology consult      Return in about 4 months (around 10/09/2023), or if symptoms worsen or fail to improve, for chronic follow-up.   Cruzita Lederer Newman Nip, FNP

## 2023-06-10 ENCOUNTER — Encounter: Payer: Self-pay | Admitting: Family Medicine

## 2023-06-10 ENCOUNTER — Ambulatory Visit (INDEPENDENT_AMBULATORY_CARE_PROVIDER_SITE_OTHER): Payer: PPO | Admitting: Family Medicine

## 2023-06-10 VITALS — BP 128/72 | HR 91 | Ht 63.0 in | Wt 128.0 lb

## 2023-06-10 DIAGNOSIS — G8929 Other chronic pain: Secondary | ICD-10-CM

## 2023-06-10 DIAGNOSIS — J320 Chronic maxillary sinusitis: Secondary | ICD-10-CM | POA: Diagnosis not present

## 2023-06-10 DIAGNOSIS — M545 Low back pain, unspecified: Secondary | ICD-10-CM

## 2023-06-10 DIAGNOSIS — N1831 Chronic kidney disease, stage 3a: Secondary | ICD-10-CM | POA: Diagnosis not present

## 2023-06-10 DIAGNOSIS — M797 Fibromyalgia: Secondary | ICD-10-CM

## 2023-06-10 DIAGNOSIS — J329 Chronic sinusitis, unspecified: Secondary | ICD-10-CM | POA: Insufficient documentation

## 2023-06-10 DIAGNOSIS — M549 Dorsalgia, unspecified: Secondary | ICD-10-CM

## 2023-06-10 MED ORDER — LEVOCETIRIZINE DIHYDROCHLORIDE 5 MG PO TABS: 5.0000 mg | ORAL_TABLET | Freq: Every evening | ORAL | 2 refills | Status: DC

## 2023-06-10 NOTE — Assessment & Plan Note (Addendum)
Recent eGFR 49 Dicussed to keep your cholesterol/ triglycerides levels under control to prevent further damage to blood vessels. Avoid NSAIDs medications and take tylenol for pain management.Consume a kidney friendly diet which includes Veggies: cauliflower, onions, eggplant, turnips. Low sodium, low to moderate intake of proteins: lean meats (poultry, fish), eggs, unsalted seafood. Avoid fatty foods, limit or avoid smoking and alcohol intake. Maintain an excercise routine to minimum of 150 minuties a week. Follow up in 3-4 months to recheck labs.   -if further renal decline, will consider nephrology consult

## 2023-06-10 NOTE — Assessment & Plan Note (Signed)
Patient is requesting a referral to a neurologist for evaluation of fibromyalgia. The last documented visit with a neurologist was in 2013

## 2023-06-10 NOTE — Assessment & Plan Note (Signed)
Trial Xyzal 5 mg at bedtime  Discussed to use saline nasal irrigation daily to clear mucus and reduce nasal irritation. Stay hydrated and use a humidifier to keep nasal passages moist, especially in dry environments. Avoid irritants like smoke, strong odors, and allergens, and practice good hygiene to reduce the risk of infections.

## 2023-06-13 ENCOUNTER — Ambulatory Visit: Payer: PPO | Admitting: Family Medicine

## 2023-06-14 ENCOUNTER — Other Ambulatory Visit: Payer: Self-pay | Admitting: Family Medicine

## 2023-06-14 DIAGNOSIS — M797 Fibromyalgia: Secondary | ICD-10-CM

## 2023-06-20 ENCOUNTER — Telehealth: Payer: Self-pay

## 2023-06-20 NOTE — Telephone Encounter (Signed)
 Copied from CRM [id number]. Topic: Clinical - Request for Lab/Test Order >> Jun 17, 2023 12:29 PM Delon DASEN wrote: Reason for CRM: Patient went to the hospital to get the xray done but they do no have an order.  Still waiting for referral for neurologist. Please call Charolette [phone]

## 2023-06-21 ENCOUNTER — Other Ambulatory Visit: Payer: Self-pay | Admitting: Family Medicine

## 2023-06-21 ENCOUNTER — Ambulatory Visit (HOSPITAL_COMMUNITY)
Admission: RE | Admit: 2023-06-21 | Discharge: 2023-06-21 | Disposition: A | Discharge status: Home / Self Care | Payer: Medicare (Managed Care) | Source: Ambulatory Visit | Attending: Family Medicine | Admitting: Family Medicine

## 2023-06-21 DIAGNOSIS — M545 Low back pain, unspecified: Secondary | ICD-10-CM | POA: Insufficient documentation

## 2023-06-21 DIAGNOSIS — M797 Fibromyalgia: Secondary | ICD-10-CM

## 2023-06-21 NOTE — Telephone Encounter (Signed)
 Xray ordered is in, Neurology referral is still pending

## 2023-06-21 NOTE — Telephone Encounter (Signed)
 Pt has been informed.

## 2023-06-21 NOTE — Assessment & Plan Note (Addendum)
 Lumbar xray ordered awaiting results will follow up. We discussed the desired effects and potential side effects of the prescribed medication for back pain. Additionally, we reviewed non-pharmacological interventions, including the importance of rest, avoiding twisting, improper bending, and straining the lower back. I demonstrated proper body mechanics to prevent further injury and advised alternating between ice and heat therapy for relief. Stretching exercises for both the back and legs were recommended to improve flexibility and support recovery. The patient was advised to follow up if symptoms worsen or persist. The patient expressed understanding of the treatment plan, and all questions were thoroughly addressed.

## 2023-06-22 ENCOUNTER — Other Ambulatory Visit (HOSPITAL_COMMUNITY): Payer: PPO

## 2023-06-27 ENCOUNTER — Telehealth: Payer: Self-pay

## 2023-06-27 NOTE — Telephone Encounter (Signed)
 Copied from CRM [id number]. Topic: Referral - Question >> Jun 23, 2023  1:10 PM Ivette P wrote: Reason for CRM: Pt was told by Dr. Hilario that they would be referred by a neurologist but has not received any communication regarding this. Pt would like a follow up, Please call Charolette at [phone]

## 2023-06-27 NOTE — Telephone Encounter (Signed)
 Copied from CRM [id number]. Topic: Clinical - Lab/Test Results >> Jun 23, 2023  1:07 PM Ivette P wrote: Reason for CRM: Pt went to get imaging done 01/07 and when getting imaging done pt fell and hurt her back. Pt is in a a lot of pain and would like to know the results of the x rays. Requesting call back [phone] (sister)

## 2023-06-28 ENCOUNTER — Other Ambulatory Visit: Payer: Self-pay | Admitting: Family Medicine

## 2023-06-28 DIAGNOSIS — M545 Low back pain, unspecified: Secondary | ICD-10-CM

## 2023-06-28 DIAGNOSIS — M797 Fibromyalgia: Secondary | ICD-10-CM

## 2023-06-28 NOTE — Telephone Encounter (Signed)
 Please inform patient [name]:   X-Ray Murillo: You have a mild forward slip of one vertebra (L4) over another (L5), called Grade 1 anterolisthesis. Anterolisthesis occurs when one vertebra slips slightly forward over the one below it. In your case, the L4 vertebra has moved forward over the L5 vertebra. This is a mild misalignment of the spine.  There's some wear and tear in the disc and joints at this level, which may be causing your back pain. No fractures or serious issues were found. Treatment Plan: Pain Relief: Over-the-counter pain relievers like ibuprofen  or acetaminophen . Physical Therapy: To strengthen your back and improve stability. Activity Modifications: Avoid heavy lifting and prolonged sitting; focus on gentle exercises. Heat/Ice Therapy: Use heat for stiffness and ice for inflammation.    Referral placed to physical therapy and Orthopedics

## 2023-06-30 ENCOUNTER — Telehealth: Payer: Self-pay | Admitting: Family Medicine

## 2023-06-30 NOTE — Telephone Encounter (Signed)
I sent in the referral ortho care care called once and no answer

## 2023-06-30 NOTE — Telephone Encounter (Signed)
Returned called, left VM.

## 2023-06-30 NOTE — Telephone Encounter (Signed)
Copied from CRM [id number]. Topic: Clinical - Medical Advice >> Jun 29, 2023  9:51 AM Jorje Guild R wrote: Reason for CRM: Patient called in wanting to know which appointment to schedule for orthopedic or neurologist. Can she get a call back as soon as possible to know how to move forward with appointment. Please contact sister

## 2023-07-07 ENCOUNTER — Other Ambulatory Visit: Payer: Self-pay | Admitting: Family Medicine

## 2023-07-07 DIAGNOSIS — F32A Depression, unspecified: Secondary | ICD-10-CM

## 2023-07-07 DIAGNOSIS — M79675 Pain in left toe(s): Secondary | ICD-10-CM | POA: Diagnosis not present

## 2023-07-07 DIAGNOSIS — B351 Tinea unguium: Secondary | ICD-10-CM | POA: Diagnosis not present

## 2023-07-07 DIAGNOSIS — M79674 Pain in right toe(s): Secondary | ICD-10-CM | POA: Diagnosis not present

## 2023-07-07 DIAGNOSIS — L84 Corns and callosities: Secondary | ICD-10-CM | POA: Diagnosis not present

## 2023-07-29 ENCOUNTER — Other Ambulatory Visit: Payer: Self-pay | Admitting: Family Medicine

## 2023-08-02 ENCOUNTER — Telehealth: Payer: Self-pay | Admitting: Family Medicine

## 2023-08-02 ENCOUNTER — Other Ambulatory Visit: Payer: Self-pay

## 2023-08-02 DIAGNOSIS — M797 Fibromyalgia: Secondary | ICD-10-CM

## 2023-08-02 NOTE — Telephone Encounter (Signed)
Sent information to referral team for corrections

## 2023-08-02 NOTE — Telephone Encounter (Signed)
Copied from CRM [id number]. Topic: Referral - Request for Referral >> Aug 02, 2023 12:55 PM Elle L wrote: Did the patient discuss referral with their provider in the last year? Yes at her last appointment.  Appointment offered? Yes  Type of order/referral and detailed reason for visit: Neurologist   Preference of office, provider, location: Dr. Lanae Crumbly [facility] [address], [place], [place] [address] Phone: [phone] Fax: [phone]. Her sister called the office and they will accept her.   If referral order, have you been seen by this specialty before? No  Can we respond through MyChart? No. Please call sister, Pennie Rushing, [phone].  The patient's sister advised me that the patient is in severe pain. However, she declined nurse triage at this time.

## 2023-08-05 ENCOUNTER — Other Ambulatory Visit: Payer: Self-pay | Admitting: Family Medicine

## 2023-08-05 ENCOUNTER — Telehealth: Payer: Self-pay | Admitting: Family Medicine

## 2023-08-05 DIAGNOSIS — M797 Fibromyalgia: Secondary | ICD-10-CM

## 2023-08-05 NOTE — Telephone Encounter (Signed)
I placed the referral to them to see Dr Clint Bolder. She has the best bedside manor and is very helpful with fibro

## 2023-08-05 NOTE — Telephone Encounter (Signed)
 THANK YOU

## 2023-08-05 NOTE — Telephone Encounter (Signed)
Copied from CRM [id number]. Topic: Referral - Status >> Aug 05, 2023 10:50 AM Prudencio Pair wrote: Reason for CRM: Marena Chancy, with [facility], called to advise that they received a referral for patient to see Dr. Sung Amabile for fibromyalgia. She wanted to advise that Dr. Sung Amabile does not treat fibromyalgia.

## 2023-08-05 NOTE — Telephone Encounter (Signed)
 Duplicate note

## 2023-08-05 NOTE — Telephone Encounter (Signed)
Please note below message. [facility] is an option for fibromyalgia

## 2023-08-05 NOTE — Telephone Encounter (Signed)
Great, how do I go about placing this referral ?

## 2023-08-08 ENCOUNTER — Other Ambulatory Visit: Payer: Self-pay | Admitting: Family Medicine

## 2023-08-08 DIAGNOSIS — F32A Depression, unspecified: Secondary | ICD-10-CM

## 2023-08-09 ENCOUNTER — Other Ambulatory Visit: Payer: Self-pay | Admitting: Family Medicine

## 2023-08-09 ENCOUNTER — Telehealth: Payer: Self-pay | Admitting: Family Medicine

## 2023-08-09 DIAGNOSIS — F419 Anxiety disorder, unspecified: Secondary | ICD-10-CM

## 2023-08-09 MED ORDER — DIAZEPAM 5 MG PO TABS: 5.0000 mg | ORAL_TABLET | Freq: Two times a day (BID) | ORAL | 0 refills | Status: DC | PRN

## 2023-08-09 NOTE — Telephone Encounter (Signed)
 Last Fill: 07/08/23 60 tabs/0 refills  Last OV: 06/10/23 Next OV: 10/07/23  Routing to provider for review/authorization.

## 2023-08-09 NOTE — Telephone Encounter (Signed)
 Copied from CRM [id number]. Topic: Referral - Question >> Aug 09, 2023  9:57 AM Kristie Cowman wrote: Reason for CRM: The patient wishes to see Dr. Lanae Crumbly, neurologist.  Patient states she has called about this referral before and wishes to know if an appointment has been set up.

## 2023-08-09 NOTE — Telephone Encounter (Signed)
 Copied from CRM [id number]. Topic: Clinical - Medication Refill >> Aug 09, 2023  9:54 AM Kristie Cowman wrote: Most Recent Primary Care Visit:  Provider: [facility]  Department: [facility]  Visit Type: OFFICE VISIT  Date: 06/10/2023  Medication: Diazepam  Has the patient contacted their pharmacy? Yes (Agent: If no, request that the patient contact the pharmacy for the refill. If patient does not wish to contact the pharmacy document the reason why and proceed with request.) (Agent: If yes, when and what did the pharmacy advise?)  Is this the correct pharmacy for this prescription? Yes If no, delete pharmacy and type the correct one.  This is the patient's preferred pharmacy:   [facility] - [place], [place] - [address] [place] [address] Phone: [phone] Fax: [phone]   Has the prescription been filled recently? No  Is the patient out of the medication? No  Has the patient been seen for an appointment in the last year OR does the patient have an upcoming appointment? Yes  Can we respond through MyChart? Yes  Agent: Please be advised that Rx refills may take up to 3 business days. We ask that you follow-up with your pharmacy.

## 2023-08-11 NOTE — Telephone Encounter (Signed)
 Neurosurgery Referral was placed on 08/02/2023 and was sent to [facility] on 08/04/2023 - Referrals can take up to 2 weeks or longer depending on the Office. [facility] reviews their Referrals, and then reaches out to the Patient for Scheduling - This Patient was sent a Mychart message with their contact information if she needed to call in regards to scheduling.

## 2023-08-18 ENCOUNTER — Encounter: Payer: Self-pay | Admitting: Physical Medicine and Rehabilitation

## 2023-08-29 ENCOUNTER — Other Ambulatory Visit: Payer: Self-pay | Admitting: Family Medicine

## 2023-08-29 DIAGNOSIS — K219 Gastro-esophageal reflux disease without esophagitis: Secondary | ICD-10-CM

## 2023-09-05 ENCOUNTER — Other Ambulatory Visit: Payer: Self-pay | Admitting: Family Medicine

## 2023-09-05 DIAGNOSIS — F32A Depression, unspecified: Secondary | ICD-10-CM

## 2023-09-06 ENCOUNTER — Encounter
Payer: Medicare (Managed Care) | Attending: Physical Medicine and Rehabilitation | Admitting: Physical Medicine and Rehabilitation

## 2023-09-06 ENCOUNTER — Encounter: Payer: Medicare (Managed Care) | Admitting: Physical Medicine and Rehabilitation

## 2023-09-06 ENCOUNTER — Encounter: Payer: Self-pay | Admitting: Physical Medicine and Rehabilitation

## 2023-09-06 VITALS — BP 141/79 | HR 85 | Ht 63.0 in | Wt 126.4 lb

## 2023-09-06 DIAGNOSIS — Z5181 Encounter for therapeutic drug level monitoring: Secondary | ICD-10-CM | POA: Diagnosis present

## 2023-09-06 DIAGNOSIS — Z79891 Long term (current) use of opiate analgesic: Secondary | ICD-10-CM | POA: Diagnosis present

## 2023-09-06 DIAGNOSIS — G894 Chronic pain syndrome: Secondary | ICD-10-CM | POA: Insufficient documentation

## 2023-09-06 DIAGNOSIS — G629 Polyneuropathy, unspecified: Secondary | ICD-10-CM | POA: Insufficient documentation

## 2023-09-06 DIAGNOSIS — F32A Depression, unspecified: Secondary | ICD-10-CM | POA: Insufficient documentation

## 2023-09-06 DIAGNOSIS — M797 Fibromyalgia: Secondary | ICD-10-CM | POA: Insufficient documentation

## 2023-09-06 DIAGNOSIS — R7303 Prediabetes: Secondary | ICD-10-CM | POA: Diagnosis present

## 2023-09-06 NOTE — Progress Notes (Unsigned)
 Subjective:    Patient ID: [name], female    DOB: [birth year], 78 y.o.   MRN: [id number]  HPI [name] is a 78 year old woman who presents to establish care for chronic pan.  1) Fibromyalgia -has tried lyrica, gabapentin, oxycodone -oxycodone helped her to function -her pain is present all day -her pain has been present for 50 years -she is now mostly in the house  2) Neuropathy: -has tried lyrica, gabapentin, oxycodone -present in bilateral feet   Pain Inventory Average Pain 10 Pain Right Now 5 My pain is intermittent, sharp, stabbing, and goes down her legs  In the last 24 hours, has pain interfered with the following? General activity 5 Relation with others 5 Enjoyment of life 5 What TIME of day is your pain at its worst? morning , daytime, evening, and night Sleep (in general) Good  last night was very poor  Pain is worse with: walking, bending, sitting, inactivity, standing, and some activites Pain improves with: rest Relief from Meds: 0  walk without assistance ability to climb steps?  yes do you drive?  yes   trouble walking spasms dizziness depression anxiety  Any changes since last visit?  no  Any changes since last visit?  no    Family History  Problem Relation Age of Onset   Depression Mother    Depression Father    Depression Sister    Anxiety disorder Sister    Anxiety disorder Sister    Alcohol abuse Neg Hx    Drug abuse Neg Hx    Social History   Socioeconomic History   Marital status: Married    Spouse name: Not on file   Number of children: Not on file   Years of education: Not on file   Highest education level: Not on file  Occupational History   Not on file  Tobacco Use   Smoking status: Former    Current packs/day: 0.25    Average packs/day: 0.3 packs/day for 50.5 years (12.6 ttl pk-yrs)    Types: Cigarettes    Start date: 06/02/2020   Smokeless tobacco: Never  Substance and Sexual Activity   Alcohol use: No     Alcohol/week: 0.0 standard drinks of alcohol   Drug use: No   Sexual activity: Never    Birth control/protection: Surgical  Other Topics Concern   Not on file  Social History Narrative   Not on file   Social Drivers of Health   Financial Resource Strain: Not on file  Food Insecurity: Not on file  Transportation Needs: Not on file  Physical Activity: Not on file  Stress: Not on file  Social Connections: Not on file   Past Surgical History:  Procedure Laterality Date   ABDOMINAL HYSTERECTOMY     BIOPSY  08/25/2018   Procedure: BIOPSY;  Surgeon: Malissa Hippo, MD;  [facility];  Service: Endoscopy;;   CATARACT EXTRACTION Bilateral    CHOLECYSTECTOMY     ESOPHAGEAL DILATION N/A 06/27/2015   Procedure: ESOPHAGEAL DILATION;  Surgeon: Malissa Hippo, MD;  [facility];  Service: Endoscopy;  Laterality: N/A;   ESOPHAGEAL DILATION N/A 08/25/2018   Procedure: ESOPHAGEAL DILATION;  Surgeon: Malissa Hippo, MD;  [facility];  Service: Endoscopy;  Laterality: N/A;   ESOPHAGOGASTRODUODENOSCOPY (EGD) WITH PROPOFOL N/A 06/27/2015   Procedure: ESOPHAGOGASTRODUODENOSCOPY (EGD) WITH PROPOFOL;  Surgeon: Malissa Hippo, MD;  [facility];  Service: Endoscopy;  Laterality: N/A;  ESOPHAGOGASTRODUODENOSCOPY (EGD) WITH PROPOFOL N/A 08/25/2018   Procedure: ESOPHAGOGASTRODUODENOSCOPY (EGD) WITH PROPOFOL;  Surgeon: Malissa Hippo, MD;  [facility];  Service: Endoscopy;  Laterality: N/A;  9:30   Past Medical History:  Diagnosis Date   Anxiety    Back pain    Fibromyalgia    Hyperlipemia    Hypothyroidism    Neuropathy    Sinusitis    Stroke (HCC)    no deficits   BP (!) 141/79   Pulse 85   Ht 5\' 3"  (1.6 m)   Wt 126 lb 6.4 oz (57.3 kg)   SpO2 98%   BMI 22.39 kg/m   Opioid Risk Score:   Fall Risk Score:  `1  Depression screen PHQ 2/9     09/06/2023    2:11 PM 03/10/2023    3:14 PM 07/16/2020    1:17 PM 06/23/2020    1:19  PM  Depression screen PHQ 2/9  Decreased Interest 3 2 2 2   Down, Depressed, Hopeless 3 2 2 2   PHQ - 2 Score 6 4 4 4   Altered sleeping 3 0 2 1  Tired, decreased energy 3 2 2 1   Change in appetite 2 1 2 1   Feeling bad or failure about yourself  3 2 2 1   Trouble concentrating 3 0 0 0  Moving slowly or fidgety/restless 0 0 1 0  Suicidal thoughts 1 0 0 0  PHQ-9 Score 21 9 13 8   Difficult doing work/chores Extremely dIfficult Somewhat difficult      Review of Systems  Musculoskeletal:  Positive for back pain and myalgias.       Very inactive... sister reports she lays on couch all day and only gets up to go to BR or kitchen  Psychiatric/Behavioral:  Positive for dysphoric mood. The patient is nervous/anxious.   All other systems reviewed and are negative.      Objective:   Physical Exam Gen: no distress, normal appearing HEENT: oral mucosa pink and moist, NCAT Cardio: Reg rate Chest: normal effort, normal rate of breathing Abd: soft, non-distended Ext: no edema Psych: pleasant, normal affect Skin: intact Neuro: Alert and oriented x3 Decreased sensation in bilateral feet      Assessment & Plan:   1) Chronic Pain Syndrome secondary to peripheral neuropathy 2/2 prediabetes -discussed that she cannot take prednisone -topamax 25mg  prescribed HS  -Discussed Qutenza as an option for neuropathic pain control. Discussed that this is a capsaicin patch, stronger than capsaicin cream. Discussed that it is currently approved for diabetic peripheral neuropathy and post-herpetic neuralgia, but that it has also shown benefit in treating other forms of neuropathy. Provided patient with link to site to learn more about the patch: https://www.clark.biz/. Discussed that the patch would be placed in office and benefits usually last 3 months. Discussed that unintended exposure to capsaicin can cause severe irritation of eyes, mucous membranes, respiratory tract, and skin, but that Qutenza is a  local treatment and does not have the systemic side effects of other nerve medications. Discussed that there may be pain, itching, erythema, and decreased sensory function associated with the application of Qutenza. Side effects usually subside within 1 week. A cold pack of analgesic medications can help with these side effects. Blood pressure can also be increased due to pain associated with administration of the patch.   -Discussed current symptoms of pain and history of pain.  -Discussed benefits of exercise in reducing pain. -Discussed following foods that may reduce pain: 1) Ginger (  especially studied for arthritis)- reduce leukotriene production to decrease inflammation 2) Blueberries- high in phytonutrients that decrease inflammation 3) Salmon- marine omega-3s reduce joint swelling and pain 4) Pumpkin seeds- reduce inflammation 5) dark chocolate- reduces inflammation 6) turmeric- reduces inflammation 7) tart cherries - reduce pain and stiffness 8) extra virgin olive oil - its compound olecanthal helps to block prostaglandins  9) chili peppers- can be eaten or applied topically via capsaicin 10) mint- helpful for headache, muscle aches, joint pain, and itching 11) garlic- reduces inflammation 12) Green tea- reduces inflammation and oxidative stress, helps with weight loss, may reduce the risk of cancer, recommend Double [employer] of Tea daily  Link to further information on diet for chronic pain: http://www.bray.com/   2) Depression: -referred to psychiatry  3) Fibromyalgia:  -discussed that she loves to go out and eat but she cannot due to her nerves and pain -discussed PT and she defers  4) Facet arthropathy:  -Discussed Qutenza as an option for neuropathic pain control. Discussed that this is a capsaicin patch, stronger than capsaicin cream. Discussed that it is currently approved for diabetic  peripheral neuropathy and post-herpetic neuralgia, but that it has also shown benefit in treating other forms of neuropathy. Provided patient with link to site to learn more about the patch: https://www.clark.biz/. Discussed that the patch would be placed in office and benefits usually last 3 months. Discussed that unintended exposure to capsaicin can cause severe irritation of eyes, mucous membranes, respiratory tract, and skin, but that Qutenza is a local treatment and does not have the systemic side effects of other nerve medications. Discussed that there may be pain, itching, erythema, and decreased sensory function associated with the application of Qutenza. Side effects usually subside within 1 week. A cold pack of analgesic medications can help with these side effects. Blood pressure can also be increased due to pain associated with administration of the patch.   UDS obtained, if contains expected metabolities, will order percocet 5mg  daily prn

## 2023-09-08 LAB — TOXASSURE SELECT,+ANTIDEPR,UR

## 2023-09-09 ENCOUNTER — Other Ambulatory Visit: Payer: Self-pay | Admitting: Physical Medicine and Rehabilitation

## 2023-09-09 MED ORDER — OXYCODONE HCL 5 MG PO TABS: 5.0000 mg | ORAL_TABLET | Freq: Every day | ORAL | 0 refills | Status: DC | PRN

## 2023-09-16 ENCOUNTER — Telehealth: Payer: Self-pay | Admitting: Physical Medicine and Rehabilitation

## 2023-09-16 NOTE — Telephone Encounter (Signed)
 Pt's sister called in states patient was unable to get all oxycodone ,and only received 7 pills , patient is about to run out and the pharmacy informed pt we would need to call in another rx . Patient uses [facility]

## 2023-09-20 ENCOUNTER — Other Ambulatory Visit: Payer: Self-pay | Admitting: Physical Medicine and Rehabilitation

## 2023-09-20 MED ORDER — OXYCODONE HCL 5 MG PO TABS: 5.0000 mg | ORAL_TABLET | Freq: Every day | ORAL | 0 refills | Status: DC | PRN

## 2023-09-23 NOTE — Telephone Encounter (Signed)
Notified of new rx.. ?

## 2023-10-05 ENCOUNTER — Encounter: Payer: Medicare (Managed Care) | Attending: Physical Medicine and Rehabilitation | Admitting: Registered Nurse

## 2023-10-05 ENCOUNTER — Encounter: Payer: Self-pay | Admitting: Registered Nurse

## 2023-10-05 VITALS — BP 144/87 | HR 79 | Resp 16 | Ht 63.0 in | Wt 128.2 lb

## 2023-10-05 DIAGNOSIS — Z87891 Personal history of nicotine dependence: Secondary | ICD-10-CM | POA: Diagnosis not present

## 2023-10-05 DIAGNOSIS — M546 Pain in thoracic spine: Secondary | ICD-10-CM | POA: Diagnosis not present

## 2023-10-05 DIAGNOSIS — G894 Chronic pain syndrome: Secondary | ICD-10-CM | POA: Diagnosis not present

## 2023-10-05 DIAGNOSIS — Z760 Encounter for issue of repeat prescription: Secondary | ICD-10-CM | POA: Diagnosis not present

## 2023-10-05 DIAGNOSIS — G629 Polyneuropathy, unspecified: Secondary | ICD-10-CM | POA: Diagnosis not present

## 2023-10-05 DIAGNOSIS — M797 Fibromyalgia: Secondary | ICD-10-CM | POA: Insufficient documentation

## 2023-10-05 DIAGNOSIS — M25561 Pain in right knee: Secondary | ICD-10-CM | POA: Diagnosis not present

## 2023-10-05 DIAGNOSIS — W19XXXA Unspecified fall, initial encounter: Secondary | ICD-10-CM | POA: Diagnosis not present

## 2023-10-05 DIAGNOSIS — G8929 Other chronic pain: Secondary | ICD-10-CM | POA: Diagnosis not present

## 2023-10-05 DIAGNOSIS — W19XXXD Unspecified fall, subsequent encounter: Secondary | ICD-10-CM | POA: Diagnosis not present

## 2023-10-05 NOTE — Progress Notes (Unsigned)
 Subjective:    Patient ID: [name], female    DOB: Nov 28, 1945, 78 y.o.   MRN: [id number]  HPI: [name] is a 78 y.o. female who returns for follow up appointment for chronic pain and medication refill. states *** pain is located in  ***. rates pain ***. current exercise regime is walking and performing stretching exercises.  [name] Morphine  equivalent is *** MME.        Pain Inventory Average Pain 8 Pain Right Now 8 My pain is constant, sharp, burning, dull, stabbing, tingling, and aching  In the last 24 hours, has pain interfered with the following? General activity 2 Relation with others 2 Enjoyment of life 2 What TIME of day is your pain at its worst? morning , daytime, evening, and night Sleep (in general) Good  Pain is worse with: walking, sitting, and standing Pain improves with:  na Relief from Meds: 8  Family History  Problem Relation Age of Onset   Depression Mother    Depression Father    Depression Sister    Anxiety disorder Sister    Anxiety disorder Sister    Alcohol  abuse Neg Hx    Drug abuse Neg Hx    Social History   Socioeconomic History   Marital status: Married    Spouse name: Not on file   Number of children: Not on file   Years of education: Not on file   Highest education level: Not on file  Occupational History   Not on file  Tobacco Use   Smoking status: Former    Current packs/day: 0.25    Average packs/day: 0.3 packs/day for 50.5 years (12.6 ttl pk-yrs)    Types: Cigarettes    Start date: 06/02/2020   Smokeless tobacco: Never  Substance and Sexual Activity   Alcohol  use: No    Alcohol /week: 0.0 standard drinks of alcohol    Drug use: No   Sexual activity: Never    Birth control/protection: Surgical  Other Topics Concern   Not on file  Social History Narrative   Not on file   Social Drivers of Health   Financial Resource Strain: Not on file  Food Insecurity: Not on file  Transportation Needs: Not on file   Physical Activity: Not on file  Stress: Not on file  Social Connections: Not on file   Past Surgical History:  Procedure Laterality Date   ABDOMINAL HYSTERECTOMY     BIOPSY  08/25/2018   Procedure: BIOPSY;  Surgeon: Ruby Corporal, MD;  [facility];  Service: Endoscopy;;   CATARACT EXTRACTION Bilateral    CHOLECYSTECTOMY     ESOPHAGEAL DILATION N/A 06/27/2015   Procedure: ESOPHAGEAL DILATION;  Surgeon: Ruby Corporal, MD;  [facility];  Service: Endoscopy;  Laterality: N/A;   ESOPHAGEAL DILATION N/A 08/25/2018   Procedure: ESOPHAGEAL DILATION;  Surgeon: Ruby Corporal, MD;  [facility];  Service: Endoscopy;  Laterality: N/A;   ESOPHAGOGASTRODUODENOSCOPY (EGD) WITH PROPOFOL  N/A 06/27/2015   Procedure: ESOPHAGOGASTRODUODENOSCOPY (EGD) WITH PROPOFOL ;  Surgeon: Ruby Corporal, MD;  [facility];  Service: Endoscopy;  Laterality: N/A;   ESOPHAGOGASTRODUODENOSCOPY (EGD) WITH PROPOFOL  N/A 08/25/2018   Procedure: ESOPHAGOGASTRODUODENOSCOPY (EGD) WITH PROPOFOL ;  Surgeon: Ruby Corporal, MD;  [facility];  Service: Endoscopy;  Laterality: N/A;  9:30   Past Surgical History:  Procedure Laterality Date   ABDOMINAL HYSTERECTOMY     BIOPSY  08/25/2018   Procedure: BIOPSY;  Surgeon:  Ruby Corporal, MD;  [facility];  Service: Endoscopy;;   CATARACT EXTRACTION Bilateral    CHOLECYSTECTOMY     ESOPHAGEAL DILATION N/A 06/27/2015   Procedure: ESOPHAGEAL DILATION;  Surgeon: Ruby Corporal, MD;  [facility];  Service: Endoscopy;  Laterality: N/A;   ESOPHAGEAL DILATION N/A 08/25/2018   Procedure: ESOPHAGEAL DILATION;  Surgeon: Ruby Corporal, MD;  [facility];  Service: Endoscopy;  Laterality: N/A;   ESOPHAGOGASTRODUODENOSCOPY (EGD) WITH PROPOFOL  N/A 06/27/2015   Procedure: ESOPHAGOGASTRODUODENOSCOPY (EGD) WITH PROPOFOL ;  Surgeon: Ruby Corporal, MD;  [facility];  Service: Endoscopy;   Laterality: N/A;   ESOPHAGOGASTRODUODENOSCOPY (EGD) WITH PROPOFOL  N/A 08/25/2018   Procedure: ESOPHAGOGASTRODUODENOSCOPY (EGD) WITH PROPOFOL ;  Surgeon: Ruby Corporal, MD;  [facility];  Service: Endoscopy;  Laterality: N/A;  9:30   Past Medical History:  Diagnosis Date   Anxiety    Back pain    Fibromyalgia    Hyperlipemia    Hypothyroidism    Neuropathy    Sinusitis    Stroke (HCC)    no deficits   There were no vitals taken for this visit.  Opioid Risk Score:   Fall Risk Score:  `1  Depression screen PHQ 2/9     09/06/2023    2:11 PM 03/10/2023    3:14 PM 07/16/2020    1:17 PM 06/23/2020    1:19 PM  Depression screen PHQ 2/9  Decreased Interest 3 2 2 2   Down, Depressed, Hopeless 3 2 2 2   PHQ - 2 Score 6 4 4 4   Altered sleeping 3 0 2 1  Tired, decreased energy 3 2 2 1   Change in appetite 2 1 2 1   Feeling bad or failure about yourself  3 2 2 1   Trouble concentrating 3 0 0 0  Moving slowly or fidgety/restless 0 0 1 0  Suicidal thoughts 1 0 0 0  PHQ-9 Score 21 9 13 8   Difficult doing work/chores Extremely dIfficult Somewhat difficult      Review of Systems  Musculoskeletal:  Positive for back pain.       Bilateral legs   All other systems reviewed and are negative.      Objective:   Physical Exam        Assessment & Plan:

## 2023-10-07 ENCOUNTER — Ambulatory Visit: Payer: PPO | Admitting: Family Medicine

## 2023-10-10 ENCOUNTER — Other Ambulatory Visit: Payer: Self-pay | Admitting: Family Medicine

## 2023-10-10 DIAGNOSIS — F32A Depression, unspecified: Secondary | ICD-10-CM

## 2023-10-10 NOTE — Telephone Encounter (Signed)
 Copied from CRM [id number]. Topic: Clinical - Medication Refill >> Oct 10, 2023  1:54 PM DeAngela L wrote: Most Recent Primary Care Visit:  Provider: Rosanna Comment  Department: [facility]  Visit Type: OFFICE VISIT  Date: 06/10/2023  Medication: diazepam  (VALIUM ) 5 MG tablet   Has the patient contacted their pharmacy? Yes  (Agent: If no, request that the patient contact the pharmacy for the refill. If patient does not wish to contact the pharmacy document the reason why and proceed with request.) (Agent: If yes, when and what did the pharmacy advise?)  Is this the correct pharmacy for this prescription? Yes  If no, delete pharmacy and type the correct one.  This is the patient's preferred pharmacy:  [facility] - [place], [place] - [address] [place] [address] Phone: [phone] Fax: [phone]   Has the prescription been filled recently? Yes   Is the patient out of the medication? Yes   Has the patient been seen for an appointment in the last year OR does the patient have an upcoming appointment? Yes   Can we respond through MyChart? No  Agent: Please be advised that Rx refills may take up to 3 business days. We ask that you follow-up with your pharmacy.

## 2023-10-12 MED ORDER — DIAZEPAM 5 MG PO TABS
5.0000 mg | ORAL_TABLET | Freq: Two times a day (BID) | ORAL | 0 refills | Status: DC | PRN
Start: 2023-10-12 — End: 2023-11-10

## 2023-10-13 ENCOUNTER — Other Ambulatory Visit: Payer: Self-pay | Admitting: Family Medicine

## 2023-10-13 ENCOUNTER — Ambulatory Visit (HOSPITAL_COMMUNITY)
Admission: RE | Admit: 2023-10-13 | Discharge: 2023-10-13 | Disposition: A | Discharge status: Home / Self Care | Payer: Medicare (Managed Care) | Source: Ambulatory Visit | Attending: Registered Nurse | Admitting: Registered Nurse

## 2023-10-13 DIAGNOSIS — F419 Anxiety disorder, unspecified: Secondary | ICD-10-CM

## 2023-10-13 DIAGNOSIS — M546 Pain in thoracic spine: Secondary | ICD-10-CM | POA: Diagnosis not present

## 2023-10-13 NOTE — Telephone Encounter (Signed)
 Copied from CRM [id number]. Topic: Clinical - Medication Refill >> Oct 13, 2023  8:41 AM Rosaria Common wrote: Most Recent Primary Care Visit:  Provider: Rosanna Comment  Department: [facility]  Visit Type: OFFICE VISIT  Date: 06/10/2023  Medication: diazepam  (VALIUM ) 5 MG tablet  Has the patient contacted their pharmacy? Yes (Agent: If no, request that the patient contact the pharmacy for the refill. If patient does not wish to contact the pharmacy document the reason why and proceed with request.) (Agent: If yes, when and what did the pharmacy advise?)  Is this the correct pharmacy for this prescription? Yes If no, delete pharmacy and type the correct one.  This is the patient's preferred pharmacy:  [facility] - [place], [place] - [address] [place] [address] Phone: [phone] Fax: [phone]   Has the prescription been filled recently? Yes  Is the patient out of the medication? Yes  Has the patient been seen for an appointment in the last year OR does the patient have an upcoming appointment? Yes  Can we respond through MyChart? Yes  Agent: Please be advised that Rx refills may take up to 3 business days. We ask that you follow-up with your pharmacy.

## 2023-10-13 NOTE — Telephone Encounter (Signed)
 Valium  refilled 10/12/23

## 2023-10-18 ENCOUNTER — Other Ambulatory Visit: Payer: Self-pay | Admitting: Family Medicine

## 2023-10-18 ENCOUNTER — Telehealth: Payer: Self-pay | Admitting: Registered Nurse

## 2023-10-18 NOTE — Telephone Encounter (Signed)
 Thoracic Xray results reviewed.  With her sister [name]. She verbalizes understanding.

## 2023-11-01 ENCOUNTER — Ambulatory Visit: Payer: Medicare (Managed Care) | Admitting: Physical Medicine and Rehabilitation

## 2023-11-04 ENCOUNTER — Encounter: Payer: Self-pay | Admitting: Family Medicine

## 2023-11-04 ENCOUNTER — Ambulatory Visit (INDEPENDENT_AMBULATORY_CARE_PROVIDER_SITE_OTHER): Payer: Medicare (Managed Care) | Admitting: Family Medicine

## 2023-11-04 VITALS — BP 134/85 | HR 99 | Resp 16 | Ht 63.0 in | Wt 127.1 lb

## 2023-11-04 DIAGNOSIS — G8929 Other chronic pain: Secondary | ICD-10-CM

## 2023-11-04 DIAGNOSIS — F419 Anxiety disorder, unspecified: Secondary | ICD-10-CM

## 2023-11-04 DIAGNOSIS — F32A Depression, unspecified: Secondary | ICD-10-CM | POA: Diagnosis not present

## 2023-11-04 DIAGNOSIS — M549 Dorsalgia, unspecified: Secondary | ICD-10-CM | POA: Diagnosis not present

## 2023-11-04 DIAGNOSIS — E782 Mixed hyperlipidemia: Secondary | ICD-10-CM

## 2023-11-04 DIAGNOSIS — E038 Other specified hypothyroidism: Secondary | ICD-10-CM | POA: Diagnosis not present

## 2023-11-04 DIAGNOSIS — E2839 Other primary ovarian failure: Secondary | ICD-10-CM | POA: Diagnosis not present

## 2023-11-04 DIAGNOSIS — R7303 Prediabetes: Secondary | ICD-10-CM

## 2023-11-04 MED ORDER — BUPROPION HCL ER (XL) 150 MG PO TB24: 150.0000 mg | ORAL_TABLET | Freq: Every day | ORAL | 2 refills | Status: DC

## 2023-11-04 NOTE — Assessment & Plan Note (Signed)
    11/04/2023    3:15 PM 03/10/2023    3:15 PM 06/23/2020    1:20 PM  GAD 7 : Generalized Anxiety Score  Nervous, Anxious, on Edge 3 2 3   Control/stop worrying 3 1 3   Worry too much - different things 3 2 3   Trouble relaxing 3 1 3   Restless 2 1 3   Easily annoyed or irritable 2 1 1   Afraid - awful might happen 2 0 1  Total GAD 7 Score 18 8 17   Anxiety Difficulty Somewhat difficult Somewhat difficult Very difficult    Patient reports taking Diazepam  5 mg twice daily PRN Advise to start Wellbutrin  150 mg once daily  We discussed several non-pharmacological approaches to managing anxiety and depression, including:  Establishing a consistent daily routine: This helps create structure and stability. Practicing mindfulness and relaxation techniques: Incorporating meditation, deep breathing exercises, or yoga to manage stress and improve emotional well-being. Engaging in regular physical activity: Aim for at least 30 minutes of exercise most days to boost mood and energy levels. Spending time outdoors: Exposure to natural light and fresh air can improve mental health. Building a support network: Encouraging social connections with friends, family, or support groups to reduce feelings of isolation. Prioritizing a balanced diet: Eating nutrient-rich foods while avoiding excessive amounts of processed foods, sugar, and unhealthy fats. Follow-up is recommended in 4-8 weeks to assess progress, with a referral to behavioral health for further support if needed.

## 2023-11-04 NOTE — Assessment & Plan Note (Signed)
 Explain lumbar xray results with patient. Patient is currently under the care of [facility] and is being treated with oxycodone  as part of their pain management regimen.

## 2023-11-04 NOTE — Patient Instructions (Addendum)
        Great to see you today.  I have refilled the medication(s) we provide.    Lumbar xray results:  You have a mild slip of one of your lower back bones (L4 over L5), called Grade 1 anterolisthesis. This means the L4 bone is slightly out of place, but it's a common finding and often treated without surgery.  There's some wear and tear (degeneration) in the disc and joints at that level, which is likely contributing to your back pain.  If labs were collected, we will inform you of lab results once received either by echart message or telephone call.   - echart message- for normal results that have been seen by the patient already.   - telephone call: abnormal results or if patient has not viewed results in their echart.   - Please take medications as prescribed. - Follow up with your primary health provider if any health concerns arises. - If symptoms worsen please contact your primary care provider and/or visit the emergency department.

## 2023-11-04 NOTE — Progress Notes (Signed)
 Established Patient Office Visit   Subjective  Patient ID: [name], female    DOB: 27-Sep-1945  Age: 78 y.o. MRN: [id number]  Chief Complaint  Patient presents with   Follow-up    States her pain level today is 10/10   Pain    Has pain in her back from the bottom all the way to the top. Rates pain 10/10. States the pain is really all over.    Anxiety    States she has terrible anxiety and she is scared to leave home and did great as long as she was on the nerve medication. She doesn't know what she takes because her daughter handles her meds     She  has a past medical history of Anxiety, Back pain, Fibromyalgia, Hyperlipemia, Hypothyroidism, Neuropathy, Sinusitis, and Stroke (HCC).   Patient presents for an initial evaluation due to anxiety symptoms that have been gradually worsening over time. She reports constant excessive worry, muscle tension, and frequent nervous or anxious behavior. These symptoms are significantly distressing and are impacting her daily functioning. She identifies family-related stressors as a major aggravating factor. Her psychiatric history is notable for anxiety with panic attacks, depression, and fibromyalgia. She has a positive family history of anxiety, which serves as an additional risk factor. Previously, she was treated with diazepam  5 mg twice daily, which provided only mild relief. She reports good compliance with prior treatment regimens.    Review of Systems  Constitutional:  Negative for chills and fever.  Respiratory:  Negative for shortness of breath.   Cardiovascular:  Negative for chest pain.  Musculoskeletal:  Positive for back pain and myalgias.  Psychiatric/Behavioral:  The patient is nervous/anxious.       Objective:     BP 134/85   Pulse 99   Resp 16   Ht 5\' 3"  (1.6 m)   Wt 127 lb 1.9 oz (57.7 kg)   SpO2 94%   BMI 22.52 kg/m  BP Readings from Last 3 Encounters:  11/04/23 134/85  10/05/23 (!) 144/87  09/06/23 (!)  141/79      Physical Exam Vitals reviewed.  Constitutional:      General: She is not in acute distress.    Appearance: Normal appearance. She is not ill-appearing, toxic-appearing or diaphoretic.  HENT:     Head: Normocephalic.  Eyes:     General:        Right eye: No discharge.        Left eye: No discharge.     Conjunctiva/sclera: Conjunctivae normal.  Cardiovascular:     Rate and Rhythm: Normal rate.     Pulses: Normal pulses.     Heart sounds: Normal heart sounds.  Pulmonary:     Effort: Pulmonary effort is normal. No respiratory distress.     Breath sounds: Normal breath sounds.  Abdominal:     Tenderness: There is no abdominal tenderness. There is no guarding.  Musculoskeletal:     Lumbar back: Decreased range of motion. Positive right straight leg raise test and positive left straight leg raise test.  Skin:    General: Skin is warm and dry.     Capillary Refill: Capillary refill takes less than 2 seconds.  Neurological:     Mental Status: She is alert.     Coordination: Coordination normal.     Gait: Gait normal.  Psychiatric:        Mood and Affect: Mood normal.        Behavior: Behavior normal.  No results found for any visits on 11/04/23.  The ASCVD Risk score (Arnett DK, et al., 2019) failed to calculate for the following reasons:   Risk score cannot be calculated because patient has a medical history suggesting prior/existing ASCVD    Assessment & Plan:  Estrogen deficiency -     DG Bone Density; Future  Prediabetes -     Hemoglobin A1c  Mixed hyperlipidemia -     Lipid panel -     BMP8+eGFR -     CBC with Differential/Platelet  TSH (thyroid -stimulating hormone deficiency) -     TSH + free T4  Chronic bilateral back pain, unspecified back location Assessment & Plan: Explain lumbar xray results with patient. Patient is currently under the care of [facility] and is being treated with oxycodone  as part  of their pain management regimen.   Anxiety and depression Assessment & Plan:    11/04/2023    3:15 PM 03/10/2023    3:15 PM 06/23/2020    1:20 PM  GAD 7 : Generalized Anxiety Score  Nervous, Anxious, on Edge 3 2 3   Control/stop worrying 3 1 3   Worry too much - different things 3 2 3   Trouble relaxing 3 1 3   Restless 2 1 3   Easily annoyed or irritable 2 1 1   Afraid - awful might happen 2 0 1  Total GAD 7 Score 18 8 17   Anxiety Difficulty Somewhat difficult Somewhat difficult Very difficult    Patient reports taking Diazepam  5 mg twice daily PRN Advise to start Wellbutrin  150 mg once daily  We discussed several non-pharmacological approaches to managing anxiety and depression, including:  Establishing a consistent daily routine: This helps create structure and stability. Practicing mindfulness and relaxation techniques: Incorporating meditation, deep breathing exercises, or yoga to manage stress and improve emotional well-being. Engaging in regular physical activity: Aim for at least 30 minutes of exercise most days to boost mood and energy levels. Spending time outdoors: Exposure to natural light and fresh air can improve mental health. Building a support network: Encouraging social connections with friends, family, or support groups to reduce feelings of isolation. Prioritizing a balanced diet: Eating nutrient-rich foods while avoiding excessive amounts of processed foods, sugar, and unhealthy fats. Follow-up is recommended in 4-8 weeks to assess progress, with a referral to behavioral health for further support if needed.    Orders: -     buPROPion  HCl ER (XL); Take 1 tablet (150 mg total) by mouth daily.  Dispense: 30 tablet; Refill: 2    Return in about 4 months (around 03/06/2024), or if symptoms worsen or fail to improve, for chronic follow-up.   Avelino Lek Amber Bail, FNP

## 2023-11-05 LAB — CBC WITH DIFFERENTIAL/PLATELET
Basophils Absolute: 0.1 10*3/uL (ref 0.0–0.2)
Basos: 1 %
EOS (ABSOLUTE): 0.3 10*3/uL (ref 0.0–0.4)
Eos: 4 %
Hematocrit: 39.4 % (ref 34.0–46.6)
Hemoglobin: 13.3 g/dL (ref 11.1–15.9)
Immature Grans (Abs): 0 10*3/uL (ref 0.0–0.1)
Immature Granulocytes: 0 %
Lymphocytes Absolute: 2.3 10*3/uL (ref 0.7–3.1)
Lymphs: 27 %
MCH: 31.5 pg (ref 26.6–33.0)
MCHC: 33.8 g/dL (ref 31.5–35.7)
MCV: 93 fL (ref 79–97)
Monocytes Absolute: 0.7 10*3/uL (ref 0.1–0.9)
Monocytes: 8 %
Neutrophils Absolute: 5.1 10*3/uL (ref 1.4–7.0)
Neutrophils: 60 %
Platelets: 222 10*3/uL (ref 150–450)
RBC: 4.22 x10E6/uL (ref 3.77–5.28)
RDW: 13.5 % (ref 11.7–15.4)
WBC: 8.4 10*3/uL (ref 3.4–10.8)

## 2023-11-05 LAB — BMP8+EGFR
BUN/Creatinine Ratio: 10 — ABNORMAL LOW (ref 12–28)
BUN: 12 mg/dL (ref 8–27)
CO2: 19 mmol/L — ABNORMAL LOW (ref 20–29)
Calcium: 9.5 mg/dL (ref 8.7–10.3)
Chloride: 107 mmol/L — ABNORMAL HIGH (ref 96–106)
Creatinine, Ser: 1.23 mg/dL — ABNORMAL HIGH (ref 0.57–1.00)
Glucose: 97 mg/dL (ref 70–99)
Potassium: 4.1 mmol/L (ref 3.5–5.2)
Sodium: 143 mmol/L (ref 134–144)
eGFR: 45 mL/min/{1.73_m2} — ABNORMAL LOW (ref >59)

## 2023-11-05 LAB — LIPID PANEL
Chol/HDL Ratio: 3.1 ratio (ref 0.0–4.4)
Cholesterol, Total: 169 mg/dL (ref 100–199)
HDL: 55 mg/dL (ref >39)
LDL Chol Calc (NIH): 74 mg/dL (ref 0–99)
Triglycerides: 247 mg/dL — ABNORMAL HIGH (ref 0–149)
VLDL Cholesterol Cal: 40 mg/dL (ref 5–40)

## 2023-11-05 LAB — TSH+FREE T4
Free T4: 0.95 ng/dL (ref 0.82–1.77)
TSH: 9.62 u[IU]/mL — ABNORMAL HIGH (ref 0.450–4.500)

## 2023-11-05 LAB — HEMOGLOBIN A1C
Est. average glucose Bld gHb Est-mCnc: 114 mg/dL
Hgb A1c MFr Bld: 5.6 % (ref 4.8–5.6)

## 2023-11-08 ENCOUNTER — Other Ambulatory Visit: Payer: Self-pay | Admitting: Physical Medicine and Rehabilitation

## 2023-11-08 ENCOUNTER — Other Ambulatory Visit: Payer: Self-pay | Admitting: Family Medicine

## 2023-11-08 ENCOUNTER — Ambulatory Visit: Payer: Self-pay | Admitting: Family Medicine

## 2023-11-08 ENCOUNTER — Telehealth: Payer: Self-pay

## 2023-11-08 DIAGNOSIS — E038 Other specified hypothyroidism: Secondary | ICD-10-CM

## 2023-11-08 MED ORDER — BUSPIRONE HCL 15 MG PO TABS: 15.0000 mg | ORAL_TABLET | Freq: Two times a day (BID) | ORAL | 2 refills | Status: DC

## 2023-11-08 NOTE — Telephone Encounter (Signed)
 Sent Buspar  15 mg medication

## 2023-11-08 NOTE — Progress Notes (Signed)
 Please inform patient,  Thyroid  panel levels elevated. We will need to repeat labs in 4 weeks. Order is in.  Please follow up in 4 weeks for repeat blood work ONLY. Patient can walk in Monday-Friday 8-11 am  or 1-4 pm make sure you're fasting for labs.   Triglycerides levels elevated, start lifestyle modifications and follow diet low in saturated fat. I encourage over the counter omega-3 fish oil 1000 mg twice daily. Continue Crestor  5 mg once daily   Limit: Saturated fats: Butter, cream, fatty meats. Trans fats: Fried foods, processed snacks. Sugar and refined carbs: Sweets, white bread. Focus on whole foods, healthy fats, and fiber to improve heart health! Maintain an exercise routine 3 to 5 days a week for a minimum total of 150 minutes.    Your eGFR levels show that your kidney function is lower. Here are some important steps to take:  Control Your Blood Pressure: Aim to keep your blood pressure below 130/80. Continue taking your blood pressure medications daily.   Keep Cholesterol in Check: This helps protect your blood vessels from further damage.  Pain Management: Avoid NSAIDs (like ibuprofen ) and use Tylenol  instead for pain relief.  Kidney-Friendly Diet:  Eat plenty of vegetables like cauliflower, onions, eggplant, and turnips. Choose low-sodium options and limit protein to lean meats (like poultry and fish), eggs, and unsalted seafood. Avoid fatty foods and limit smoking and alcohol . Stay Active: Aim for at least 150 minutes of exercise each week.  Please plan to follow up in 4 months for a lab recheck.

## 2023-11-08 NOTE — Telephone Encounter (Signed)
 Copied from CRM [id number]. Topic: Clinical - Prescription Issue >> Nov 08, 2023  4:03 PM [name] wrote: Reason for CRM: [id number] Patient thought she was getting a new prescription called in after her visit with [name], FNP. She states that she only got a refill of welbutrin. AVS notes that was the only prescription being sent over. Patient requests a call back to know if she was supposed to be getting another medication sent over as well.

## 2023-11-09 ENCOUNTER — Other Ambulatory Visit: Payer: Self-pay | Admitting: Family Medicine

## 2023-11-09 DIAGNOSIS — F419 Anxiety disorder, unspecified: Secondary | ICD-10-CM

## 2023-11-09 NOTE — Telephone Encounter (Signed)
 Patient advised.

## 2023-11-10 ENCOUNTER — Other Ambulatory Visit: Payer: Self-pay | Admitting: Physical Medicine and Rehabilitation

## 2023-11-10 MED ORDER — OXYCODONE HCL 5 MG PO TABS: 5.0000 mg | ORAL_TABLET | Freq: Every day | ORAL | 0 refills | Status: DC | PRN

## 2023-11-14 ENCOUNTER — Encounter: Payer: Medicare (Managed Care) | Admitting: Physical Medicine and Rehabilitation

## 2023-11-15 ENCOUNTER — Telehealth: Payer: Self-pay

## 2023-11-15 NOTE — Telephone Encounter (Signed)
 Qutenza patches denied

## 2023-11-17 NOTE — Telephone Encounter (Signed)
 Spoke to Dr. Alessandra Ancona about this and called ad left message for patient to call back.

## 2023-11-22 DIAGNOSIS — H6121 Impacted cerumen, right ear: Secondary | ICD-10-CM | POA: Diagnosis not present

## 2023-11-22 DIAGNOSIS — Z6823 Body mass index (BMI) 23.0-23.9, adult: Secondary | ICD-10-CM | POA: Diagnosis not present

## 2023-11-22 DIAGNOSIS — R030 Elevated blood-pressure reading, without diagnosis of hypertension: Secondary | ICD-10-CM | POA: Diagnosis not present

## 2023-11-28 ENCOUNTER — Other Ambulatory Visit: Payer: Self-pay | Admitting: Family Medicine

## 2023-11-28 DIAGNOSIS — F419 Anxiety disorder, unspecified: Secondary | ICD-10-CM

## 2023-11-30 ENCOUNTER — Telehealth: Payer: Self-pay | Admitting: Family Medicine

## 2023-11-30 NOTE — Telephone Encounter (Signed)
 Prescription Request  11/30/2023  LOV: 11/04/2023  What is the name of the medication or equipment? amitriptyline  (ELAVIL ) 25 MG table   Have you contacted your pharmacy to request a refill? Yes   Which pharmacy would you like this sent to?  [facility] - [place], [place] - [address]  [place] [address] Phone: [phone] Fax: [phone]    Patient notified that their request is being sent to the clinical staff for review and that they should receive a response within 2 business days.   Please advise at walked into office.

## 2023-12-01 ENCOUNTER — Ambulatory Visit: Payer: Self-pay

## 2023-12-01 NOTE — Telephone Encounter (Signed)
 Caller from [facility] pharmacy called in about refill. I let her know of message that was left about Dr Arlyne Bering being out yesterday, but she is still checking status of approval for this refill , amitriptyline  (ELAVIL ) 25 MG tablet

## 2023-12-01 NOTE — Telephone Encounter (Signed)
 Opened triage encounter for pt, see other nurse note 6/19.

## 2023-12-01 NOTE — Telephone Encounter (Signed)
 Patient sister calling again in regard to refill request. Just checked the schedule and [name] will not be back in until Tuesday- is there any way this can be approved to fill? Please advise Thank you

## 2023-12-01 NOTE — Telephone Encounter (Signed)
Correction. Wednesday.

## 2023-12-01 NOTE — Telephone Encounter (Signed)
 FYI Only or Action Required?: Action required by provider: refusing ED, been requesting refill of anxiety med while PCP out of office - please have another provider advise/offer appt.  Patient was last seen in primary care on 11/04/2023 by Rosanna Comment, FNP. Called Nurse Triage reporting Medication Refill, Anxiety, Chest Pain, Nausea, Dizziness, and Cough. Symptoms began a week ago. Interventions attempted: Prescription medications: buspirone . Symptoms are: rapidly worsening.  Triage Disposition: Go to ED Now (Notify PCP)  Patient/caregiver understands and will follow disposition?: No, refuses disposition - Please call pt back     Reason for Disposition  Dizziness or lightheadedness  Answer Assessment - Initial Assessment Questions Last prescribed November 2024, pt confirms [place] NP has been prescribing amitriptyline  since pt been with under her care but shows as a discontinued med in med list    1. DRUG NAME: What medicine do you need to have refilled?     Amitriptyline  2. REFILLS REMAINING: How many refills are remaining? (Note: The label on the medicine or pill bottle will show how many refills are remaining. If there are no refills remaining, then a renewal may be needed.)     No refills left 4. PRESCRIBING HCP: Who prescribed it? Reason: If prescribed by specialist, call should be referred to that group.     Del Amber Bail NP since been seeing her 5. SYMPTOMS: Do you have any symptoms?     My nerves are terrible, bad nerves since I was a child, confirms anxiety has gotten lot worse in last few days   Been out of med for few days So sick on my stomach, try to get up and straighten the house out and can't do it, know it's nerves  Answer Assessment - Initial Assessment Questions 1. LOCATION: Where does it hurt?       Right side 2. RADIATION: Does the pain go anywhere else? (e.g., into neck, jaw, arms, back)     I hurt all over because I've got  fibromyalgia, neuropathy 3. ONSET: When did the chest pain begin? (Minutes, hours or days)      Last night 4. PATTERN: Does the pain come and go, or has it been constant since it started?  Does it get worse with exertion?      Comes and goes 5. DURATION: How long does it last (e.g., seconds, minutes, hours)     Just a few seconds but keeps coming right back 6. SEVERITY: How bad is the pain?  (e.g., Scale 1-10; mild, moderate, or severe)    - MILD (1-3): doesn't interfere with normal activities     - MODERATE (4-7): interferes with normal activities or awakens from sleep    - SEVERE (8-10): excruciating pain, unable to do any normal activities       On couch, can't clean my house, pain level is at least 7-8/10 all over including legs, hurt from head to feet, not severe just pains right up under breast on right side 7. CARDIAC RISK FACTORS: Do you have any history of heart problems or risk factors for heart disease? (e.g., angina, prior heart attack; diabetes, high blood pressure, high cholesterol, smoker, or strong family history of heart disease)     PE 8. PULMONARY RISK FACTORS: Do you have any history of lung disease?  (e.g., blood clots in lung, asthma, emphysema, birth control pills)     Hx PE 9. CAUSE: What do you think is causing the chest pain?     Just started  last night 10. OTHER SYMPTOMS: Do you have any other symptoms? (e.g., dizziness, nausea, vomiting, sweating, fever, difficulty breathing, cough)       No SOB, but get lightheaded and sick on my stomach, no vomiting, cough but no fever, I smoke  Refusing ED Still in pain but when had nervous breakdown felt like dying 2-3 years ago No thoughts of harming or killing herself, but did in nervous breakdown years ago Can't go without nerve pills   Pt refusing at this time, requesting her refill of anxiety med, advised go to ED or call 911 if worsening anxiety or chest pain or if new symptoms like SOB, sending  message to see if another provider can take a look at the situation while PCP is out of office and offer further recommendations/appt options  Protocols used: Medication Refill and Renewal Call-A-AH, Chest Pain-A-AH

## 2023-12-02 NOTE — Telephone Encounter (Signed)
 Awaiting iliana approval

## 2023-12-05 ENCOUNTER — Ambulatory Visit: Payer: Self-pay

## 2023-12-05 NOTE — Telephone Encounter (Signed)
 Copied from CRM [id number]. Topic: Clinical - Red Word Triage >> Dec 05, 2023  3:07 PM Larissa RAMAN wrote: [facility] that prompted transfer to Nurse Triage: med withdrawal    Reason for Disposition  Caller requesting a CONTROLLED substance prescription refill (e.g., narcotics, ADHD medicines)  Answer Assessment - Initial Assessment Questions Patient calling to check on refill request for Amitriptyline . Patient reports nausea and states I'm going through withdrawal. Patient refusing ED at this time. Please advise.      1. DRUG NAME: What medicine do you need to have refilled?     Amitriptyline  5. SYMPTOMS: Do you have any symptoms?     Nausea I'm going through withdrawal, I've been through it before  Protocols used: Medication Refill and Renewal Call-A-AH    FYI Only or Action Required?: Action required by provider: medication refill request.  Patient was last seen in primary care on 11/04/2023 by Del Wilhelmena Lloyd Sola, FNP. Called Nurse Triage reporting Medication Problem. Symptoms began a week ago. Interventions attempted: Other: has been trying to get medication refilled. Symptoms are: gradually worsening.  Triage Disposition: Call PCP When Office is Open  Patient/caregiver understands and will follow disposition?: Yes

## 2023-12-07 ENCOUNTER — Other Ambulatory Visit: Payer: Self-pay | Admitting: Internal Medicine

## 2023-12-07 NOTE — Telephone Encounter (Signed)
 Patient sister here says patient is needing this medication called in been asking for almost a week says patient is crying having withdrawals. Can someone please call this in for the patient to [facility] pharmacy. Thank you

## 2023-12-07 NOTE — Telephone Encounter (Signed)
 Verified with the pharmacy that she picked it up today

## 2023-12-07 NOTE — Telephone Encounter (Signed)
 Any update?

## 2023-12-07 NOTE — Telephone Encounter (Signed)
 This message was sent to [facility] on 6/18 without response as of yet. Patient calling back for update. Can this medication be refilled since she is out of the office?

## 2023-12-08 ENCOUNTER — Other Ambulatory Visit: Payer: Self-pay

## 2023-12-08 ENCOUNTER — Other Ambulatory Visit: Payer: Self-pay | Admitting: Family Medicine

## 2023-12-08 DIAGNOSIS — F32A Depression, unspecified: Secondary | ICD-10-CM

## 2023-12-08 MED ORDER — DIAZEPAM 5 MG PO TABS: 5.0000 mg | ORAL_TABLET | Freq: Two times a day (BID) | ORAL | 0 refills | Status: DC

## 2023-12-08 MED ORDER — ROSUVASTATIN CALCIUM 5 MG PO TABS: 5.0000 mg | ORAL_TABLET | Freq: Every day | ORAL | 3 refills | Status: DC

## 2023-12-23 DIAGNOSIS — E038 Other specified hypothyroidism: Secondary | ICD-10-CM | POA: Diagnosis not present

## 2023-12-25 LAB — TSH+FREE T4
Free T4: 0.96 ng/dL (ref 0.82–1.77)
TSH: 2.56 u[IU]/mL (ref 0.450–4.500)

## 2023-12-25 LAB — THYROID PEROXIDASE ANTIBODY: Thyroperoxidase Ab SerPl-aCnc: 13 [IU]/mL (ref 0–34)

## 2023-12-25 LAB — THYROID STIMULATING IMMUNOGLOBULIN: Thyroid Stim Immunoglobulin: <0.1 IU/L (ref 0.00–0.55)

## 2023-12-26 ENCOUNTER — Encounter
Payer: Medicare (Managed Care) | Attending: Physical Medicine and Rehabilitation | Admitting: Physical Medicine and Rehabilitation

## 2023-12-26 ENCOUNTER — Encounter: Payer: Self-pay | Admitting: Physical Medicine and Rehabilitation

## 2023-12-26 VITALS — BP 119/76 | HR 90 | Ht 63.0 in | Wt 127.0 lb

## 2023-12-26 DIAGNOSIS — M797 Fibromyalgia: Secondary | ICD-10-CM | POA: Diagnosis not present

## 2023-12-26 DIAGNOSIS — M170 Bilateral primary osteoarthritis of knee: Secondary | ICD-10-CM | POA: Diagnosis not present

## 2023-12-26 DIAGNOSIS — G4701 Insomnia due to medical condition: Secondary | ICD-10-CM | POA: Diagnosis not present

## 2023-12-26 DIAGNOSIS — G629 Polyneuropathy, unspecified: Secondary | ICD-10-CM | POA: Insufficient documentation

## 2023-12-26 DIAGNOSIS — F32A Depression, unspecified: Secondary | ICD-10-CM | POA: Insufficient documentation

## 2023-12-26 DIAGNOSIS — M47816 Spondylosis without myelopathy or radiculopathy, lumbar region: Secondary | ICD-10-CM | POA: Diagnosis not present

## 2023-12-26 MED ORDER — OXYCODONE HCL 5 MG PO TABS: 5.0000 mg | ORAL_TABLET | Freq: Two times a day (BID) | ORAL | 0 refills | Status: DC | PRN

## 2023-12-26 MED ORDER — DULOXETINE HCL 20 MG PO CPEP
20.0000 mg | ORAL_CAPSULE | Freq: Every day | ORAL | 3 refills | Status: DC
Start: 2023-12-26 — End: 2024-02-21

## 2023-12-26 NOTE — Progress Notes (Signed)
 Subjective:    Patient ID: [name], female    DOB: 03-Feb-1946, 78 y.o.   MRN: [id number]  [name] is a 78 year old woman who presents to establish care for chronic pan.  1) Fibromyalgia -has tried lyrica, gabapentin , oxycodone  -oxycodone  helped her to function -her pain is present all day -her pain has been present for 50 years -she is now mostly in the house  2) Neuropathy: -has tried lyrica, gabapentin , oxycodone  -present in bilateral feet -family asks for evaluation to assess blood flow   Pain Inventory Average Pain 7 Pain Right Now 7 My pain is intermittent, burning, and aching  In the last 24 hours, has pain interfered with the following? General activity 10 Relation with others 10 Enjoyment of life 10 What TIME of day is your pain at its worst? morning , daytime, evening, and night Sleep (in general) Good   Pain is worse with: walking, bending, sitting, inactivity, standing, and some activites Pain improves with: rest and medication Relief from Meds: 5      Family History  Problem Relation Age of Onset   Depression Mother    Depression Father    Depression Sister    Anxiety disorder Sister    Anxiety disorder Sister    Alcohol  abuse Neg Hx    Drug abuse Neg Hx    Social History   Socioeconomic History   Marital status: Married    Spouse name: Not on file   Number of children: Not on file   Years of education: Not on file   Highest education level: Not on file  Occupational History   Not on file  Tobacco Use   Smoking status: Former    Current packs/day: 0.25    Average packs/day: 0.2 packs/day for 50.8 years (12.7 ttl pk-yrs)    Types: Cigarettes    Start date: 06/02/2020   Smokeless tobacco: Never  Substance and Sexual Activity   Alcohol  use: No    Alcohol /week: 0.0 standard drinks of alcohol    Drug use: No   Sexual activity: Never    Birth control/protection: Surgical  Other Topics Concern   Not on file  Social History  Narrative   Not on file   Social Drivers of Health   Financial Resource Strain: Not on file  Food Insecurity: Not on file  Transportation Needs: Not on file  Physical Activity: Not on file  Stress: Not on file  Social Connections: Not on file   Past Surgical History:  Procedure Laterality Date   ABDOMINAL HYSTERECTOMY     BIOPSY  08/25/2018   Procedure: BIOPSY;  Surgeon: Golda Claudis PENNER, MD;  [facility];  Service: Endoscopy;;   CATARACT EXTRACTION Bilateral    CHOLECYSTECTOMY     ESOPHAGEAL DILATION N/A 06/27/2015   Procedure: ESOPHAGEAL DILATION;  Surgeon: Claudis PENNER Golda, MD;  [facility];  Service: Endoscopy;  Laterality: N/A;   ESOPHAGEAL DILATION N/A 08/25/2018   Procedure: ESOPHAGEAL DILATION;  Surgeon: Golda Claudis PENNER, MD;  [facility];  Service: Endoscopy;  Laterality: N/A;   ESOPHAGOGASTRODUODENOSCOPY (EGD) WITH PROPOFOL  N/A 06/27/2015   Procedure: ESOPHAGOGASTRODUODENOSCOPY (EGD) WITH PROPOFOL ;  Surgeon: Claudis PENNER Golda, MD;  [facility];  Service: Endoscopy;  Laterality: N/A;   ESOPHAGOGASTRODUODENOSCOPY (EGD) WITH PROPOFOL  N/A 08/25/2018   Procedure: ESOPHAGOGASTRODUODENOSCOPY (EGD) WITH PROPOFOL ;  Surgeon: Golda Claudis PENNER, MD;  [facility];  Service: Endoscopy;  Laterality: N/A;  9:30   Past Medical  History:  Diagnosis Date   Anxiety    Back pain    Fibromyalgia    Hyperlipemia    Hypothyroidism    Neuropathy    Sinusitis    Stroke (HCC)    no deficits   Ht 5' 3 (1.6 m)   Wt 127 lb (57.6 kg)   BMI 22.50 kg/m   Opioid Risk Score:   Fall Risk Score:  `1  Depression screen PHQ 2/9     11/04/2023    3:14 PM 10/05/2023    2:12 PM 09/06/2023    2:11 PM 03/10/2023    3:14 PM 07/16/2020    1:17 PM 06/23/2020    1:19 PM  Depression screen PHQ 2/9  Decreased Interest 0 3 3 2 2 2   Down, Depressed, Hopeless 0 3 3 2 2 2   PHQ - 2 Score 0 6 6 4 4 4   Altered sleeping 1  3 0 2 1  Tired, decreased energy 3   3 2 2 1   Change in appetite 0  2 1 2 1   Feeling bad or failure about yourself  3  3 2 2 1   Trouble concentrating 0  3 0 0 0  Moving slowly or fidgety/restless 0  0 0 1 0  Suicidal thoughts 2  1 0 0 0  PHQ-9 Score 9  21 9 13 8   Difficult doing work/chores Very difficult  Extremely dIfficult Somewhat difficult      Review of Systems  Musculoskeletal:  Positive for back pain, gait problem and myalgias.  Neurological:  Positive for numbness.  Psychiatric/Behavioral:  Positive for dysphoric mood. The patient is nervous/anxious.   All other systems reviewed and are negative.      Objective:   Physical Exam Gen: no distress, normal appearing HEENT: oral mucosa pink and moist, NCAT Cardio: Reg rate Chest: normal effort, normal rate of breathing Abd: soft, non-distended Ext: no edema Psych: pleasant, normal affect Skin: intact Neuro: Alert and oriented x3 Decreased sensation in bilateral feet, diffuse tender points      Assessment & Plan:   1) Chronic Pain Syndrome secondary to peripheral neuropathy 2/2 prediabetes -discussed that she cannot take prednisone -topamax 25mg  prescribed HS -discussed cymbalta   -Discussed Qutenza as an option for neuropathic pain control. Discussed that this is a capsaicin patch, stronger than capsaicin cream. Discussed that it is currently approved for diabetic peripheral neuropathy and post-herpetic neuralgia, but that it has also shown benefit in treating other forms of neuropathy. Provided patient with link to site to learn more about the patch: https://www.clark.biz/. Discussed that the patch would be placed in office and benefits usually last 3 months. Discussed that unintended exposure to capsaicin can cause severe irritation of eyes, mucous membranes, respiratory tract, and skin, but that Qutenza is a local treatment and does not have the systemic side effects of other nerve medications. Discussed that there may be pain, itching, erythema, and  decreased sensory function associated with the application of Qutenza. Side effects usually subside within 1 week. A cold pack of analgesic medications can help with these side effects. Blood pressure can also be increased due to pain associated with administration of the patch.   -Discussed current symptoms of pain and history of pain.  -Discussed benefits of exercise in reducing pain. -Discussed following foods that may reduce pain: 1) Ginger (especially studied for arthritis)- reduce leukotriene production to decrease inflammation 2) Blueberries- high in phytonutrients that decrease inflammation 3) Salmon- marine omega-3s reduce joint swelling and pain  4) Pumpkin seeds- reduce inflammation 5) dark chocolate- reduces inflammation 6) turmeric- reduces inflammation 7) tart cherries - reduce pain and stiffness 8) extra virgin olive oil - its compound olecanthal helps to block prostaglandins  9) chili peppers- can be eaten or applied topically via capsaicin 10) mint- helpful for headache, muscle aches, joint pain, and itching 11) garlic- reduces inflammation 12) Green tea- reduces inflammation and oxidative stress, helps with weight loss, may reduce the risk of cancer, recommend Double [employer] of Tea daily  Link to further information on diet for chronic pain: http://www.bray.com/   2) Depression: -referred to psychiatry -discussed cymbalta   3) Fibromyalgia:  -discussed that she loves to go out and eat but she cannot due to her nerves and pain -discussed PT and she defers -discussed cymbalta    4) Facet arthropathy:  -Discussed Qutenza as an option for neuropathic pain control. Discussed that this is a capsaicin patch, stronger than capsaicin cream. Discussed that it is currently approved for diabetic peripheral neuropathy and post-herpetic neuralgia, but that it has also shown benefit in treating other  forms of neuropathy. Provided patient with link to site to learn more about the patch: https://www.clark.biz/. Discussed that the patch would be placed in office and benefits usually last 3 months. Discussed that unintended exposure to capsaicin can cause severe irritation of eyes, mucous membranes, respiratory tract, and skin, but that Qutenza is a local treatment and does not have the systemic side effects of other nerve medications. Discussed that there may be pain, itching, erythema, and decreased sensory function associated with the application of Qutenza. Side effects usually subside within 1 week. A cold pack of analgesic medications can help with these side effects. Blood pressure can also be increased due to pain associated with administration of the patch.   Oxycodone  5mg  BID prescribed prn -discussed cymbalta - discussed that she has tried before but cannot recall response  5) IBS:  -recommended getting prunes  6) Insomnia: -continue Relaxium  7) Constipation:  -Provided list of following foods that help with constipation and highlighted a few: 1) prunes- contain high amounts of fiber.  2) apples- has a form of dietary fiber called pectin that accelerates stool movement and increases beneficial gut bacteria 3) pears- in addition to fiber, also high in fructose and sorbitol which have laxative effect 4) figs- contain an enzyme ficin which helps to speed colonic transit 5) kiwis- contain an enzyme actinidin that improves gut motility and reduces constipation 6) oranges- rich in pectin (like apples) 7) grapefruits- contain a flavanol naringenin which has a laxative effect 8) vegetables- rich in fiber and also great sources of folate, vitamin C, and K 9) artichoke- high in inulin, prebiotic great for the microbiome 10) chicory- increases stool frequency and softness (can be added to coffee) 11) rhubarb- laxative effect 12) sweet potato- high fiber 13) beans, peas, and lentils-  contain both soluble and insoluble fiber 14) chia seeds- improves intestinal health and gut flora 15) flaxseeds- laxative effect 16) whole grain rye bread- high in fiber 17) oat bran- high in soluble and insoluble fiber 18) kefir- softens stools -recommended to try at least one of these foods every day.  -drink 6-8 glasses of water per day -walk regularly, especially after meals.

## 2023-12-26 NOTE — Patient Instructions (Signed)
 Constipation:  -Provided list of following foods that help with constipation and highlighted a few: 1) prunes- contain high amounts of fiber.  2) apples- has a form of dietary fiber called pectin that accelerates stool movement and increases beneficial gut bacteria 3) pears- in addition to fiber, also high in fructose and sorbitol which have laxative effect 4) figs- contain an enzyme ficin which helps to speed colonic transit 5) kiwis- contain an enzyme actinidin that improves gut motility and reduces constipation 6) oranges- rich in pectin (like apples) 7) grapefruits- contain a flavanol naringenin which has a laxative effect 8) vegetables- rich in fiber and also great sources of folate, vitamin C, and K 9) artichoke- high in inulin, prebiotic great for the microbiome 10) chicory- increases stool frequency and softness (can be added to coffee) 11) rhubarb- laxative effect 12) sweet potato- high fiber 13) beans, peas, and lentils- contain both soluble and insoluble fiber 14) chia seeds- improves intestinal health and gut flora 15) flaxseeds- laxative effect 16) whole grain rye bread- high in fiber 17) oat bran- high in soluble and insoluble fiber -recommended to try at least one of these foods every day.  -drink 6-8 glasses of water per day -walk regularly, especially after meals.   SABRA

## 2023-12-28 ENCOUNTER — Telehealth: Payer: Self-pay

## 2023-12-28 NOTE — Telephone Encounter (Signed)
 Patient stated the extra Oxycodone  5 MG is working well. But she cannot take the Cymbalta . In the past Cymbalta  has not done anything for her. Will send in a replacement medication?   Call back phone [phone].

## 2024-01-05 ENCOUNTER — Other Ambulatory Visit: Payer: Self-pay | Admitting: Family Medicine

## 2024-01-05 NOTE — Telephone Encounter (Unsigned)
 Copied from CRM [id number]. Topic: Clinical - Medication Refill >> Jan 05, 2024  5:21 PM [name] wrote: Medication: rosuvastatin  (CRESTOR ) 5 MG tablet   Requesting a 90 day supply as pt can get it cheaper. She did get a 30 day supply but [name] is asking if she can get 90  Has the patient contacted their pharmacy? Yes (Agent: If no, request that the patient contact the pharmacy for the refill. If patient does not wish to contact the pharmacy document the reason why and proceed with request.) (Agent: If yes, when and what did the pharmacy advise?)  This is the patient's preferred pharmacy:  [facility] - [place], [place] - [address] [place] [address] Phone: [phone] Fax: [phone]  Is this the correct pharmacy for this prescription? Yes If no, delete pharmacy and type the correct one.   Has the prescription been filled recently? Yes  Is the patient out of the medication? No  Has the patient been seen for an appointment in the last year OR does the patient have an upcoming appointment? Yes  Can we respond through MyChart? No  Agent: Please be advised that Rx refills may take up to 3 business days. We ask that you follow-up with your pharmacy.

## 2024-01-06 MED ORDER — ROSUVASTATIN CALCIUM 5 MG PO TABS: 5.0000 mg | ORAL_TABLET | Freq: Every day | ORAL | 3 refills | Status: AC

## 2024-01-09 ENCOUNTER — Other Ambulatory Visit: Payer: Self-pay | Admitting: Family Medicine

## 2024-01-09 DIAGNOSIS — F32A Depression, unspecified: Secondary | ICD-10-CM

## 2024-01-09 NOTE — Telephone Encounter (Signed)
 Copied from CRM [id number]. Topic: Clinical - Medication Refill >> Jan 09, 2024  2:17 PM Turkey B wrote: Medication: rosuvastatin  (CRESTOR ) 5 MG tablet/ pt requesting 90 day supply because she already received 30 day supply but can get 90 day supply at the same cost  Has the patient contacted their pharmacy? Yes  (Agent: If yes, when and what did the pharmacy advise?)pt insurance called in for pt  This is the patient's preferred pharmacy:  [facility] - [place], [place] - [address] [place] [address] Phone: [phone] Fax: [phone]  Is this the correct pharmacy for this prescription? yes  Has the prescription been filled recently? no  Is the patient out of the medication? no  Has the patient been seen for an appointment in the last year OR does the patient have an upcoming appointment? yes  Can we respond through MyChart? yes  Agent: Please be advised that Rx refills may take up to 3 business days. We ask that you follow-up with your pharmacy.

## 2024-01-10 ENCOUNTER — Telehealth: Payer: Self-pay | Admitting: Family Medicine

## 2024-01-10 NOTE — Telephone Encounter (Signed)
 Patient still waiting on a refill for  diazepam  (VALIUM ) 5 MG tablet [[id number]]   Pharmacy: [facility]

## 2024-01-11 ENCOUNTER — Telehealth: Payer: Self-pay

## 2024-01-11 NOTE — Telephone Encounter (Signed)
 Patient advised.

## 2024-01-11 NOTE — Telephone Encounter (Signed)
 Insurance calling on behalf of patient and requesting 90 day supply for rosuvastatin .

## 2024-01-11 NOTE — Telephone Encounter (Signed)
 I sent this

## 2024-01-11 NOTE — Telephone Encounter (Signed)
Rx sent on 7/29

## 2024-01-11 NOTE — Telephone Encounter (Signed)
 Copied from CRM [id number]. Topic: Clinical - Prescription Issue >> Jan 10, 2024  4:10 PM Fonda T wrote: Reason for CRM: Received call from Tammy, with [facility] pharmacy, inquiring on medication status refill for diazepam  (VALIUM ) 5 MG tablet.  Per pharmacy would like to speak to nurse regarding refill, and approval of refill as patient is out of medication. No response received from previous request.  [facility] - [place], [place] - [address] [place] [address] Phone: [phone] Fax: [phone]

## 2024-01-17 ENCOUNTER — Ambulatory Visit: Payer: Medicare (Managed Care) | Admitting: Orthopedic Surgery

## 2024-01-17 ENCOUNTER — Other Ambulatory Visit: Payer: Self-pay

## 2024-01-17 ENCOUNTER — Encounter: Payer: Self-pay | Admitting: Orthopedic Surgery

## 2024-01-17 ENCOUNTER — Other Ambulatory Visit (INDEPENDENT_AMBULATORY_CARE_PROVIDER_SITE_OTHER): Payer: Self-pay

## 2024-01-17 VITALS — BP 129/80 | HR 80 | Ht 63.0 in | Wt 127.0 lb

## 2024-01-17 DIAGNOSIS — M170 Bilateral primary osteoarthritis of knee: Secondary | ICD-10-CM

## 2024-01-17 DIAGNOSIS — G8929 Other chronic pain: Secondary | ICD-10-CM

## 2024-01-17 DIAGNOSIS — M1711 Unilateral primary osteoarthritis, right knee: Secondary | ICD-10-CM

## 2024-01-17 DIAGNOSIS — M1712 Unilateral primary osteoarthritis, left knee: Secondary | ICD-10-CM

## 2024-01-17 NOTE — Progress Notes (Signed)
 New Patient Visit  Assessment: [name] is a 78 y.o. female with the following: 1. Bilateral chronic knee pain 2. Primary osteoarthritis of right knee 3. Primary osteoarthritis of left knee   Plan: [name] has pain in bilateral knees.  Right is worse than left.  She has moderate degenerative changes on x-ray.  On physical exam, she does have crepitus, but the knee is otherwise stable.  Diffuse pain throughout both knees.  She is reluctant to consider steroid injections, due to previous reactions to oral steroids.  We discussed the possibility of proceeding with hyaluronic acid injections.  She is interested.  We will initiate authorization, and schedule injections once insurance has approved these injections.  Follow-up: Return for After Insurance Authorization for Injection.  Subjective:  Chief Complaint  Patient presents with   Knee Pain    Both for many years worse recently about 18 years ago had hyaluronic acid injections/ interested in them again if you think they will help     History of Present Illness: [name] is a 78 y.o. female who has been referred by  Krutika Raulkar, MD for evaluation of bilateral knee pain.  She has had pain in both knees chronically for years.  No specific injury.  Right is worse than left.  She has had hyaluronic acid injections, but this was several years ago.  Currently, she is not taking any medication specifically for her knees.  She does report that she has neuropathy.  She also has chronic low back pain.  She has not worked with therapy.  She is not interested in working with therapy.  She states that she has previously had adverse reactions to steroids.  She reports that she had an injection of steroid, followed by a prednisone Dosepak, which resulted in some heart issues.  This required multiple visits for her heart, and is concerned about any potential steroid injections.   Review of Systems: No fevers or chills No numbness  or tingling No chest pain No shortness of breath No bowel or bladder dysfunction No GI distress No headaches   Medical History:  Past Medical History:  Diagnosis Date   Anxiety    Back pain    Fibromyalgia    Hyperlipemia    Hypothyroidism    Neuropathy    Sinusitis    Stroke (HCC)    no deficits    Past Surgical History:  Procedure Laterality Date   ABDOMINAL HYSTERECTOMY     BIOPSY  08/25/2018   Procedure: BIOPSY;  Surgeon: Golda Claudis PENNER, MD;  [facility];  Service: Endoscopy;;   CATARACT EXTRACTION Bilateral    CHOLECYSTECTOMY     ESOPHAGEAL DILATION N/A 06/27/2015   Procedure: ESOPHAGEAL DILATION;  Surgeon: Claudis PENNER Golda, MD;  [facility];  Service: Endoscopy;  Laterality: N/A;   ESOPHAGEAL DILATION N/A 08/25/2018   Procedure: ESOPHAGEAL DILATION;  Surgeon: Golda Claudis PENNER, MD;  [facility];  Service: Endoscopy;  Laterality: N/A;   ESOPHAGOGASTRODUODENOSCOPY (EGD) WITH PROPOFOL  N/A 06/27/2015   Procedure: ESOPHAGOGASTRODUODENOSCOPY (EGD) WITH PROPOFOL ;  Surgeon: Claudis PENNER Golda, MD;  [facility];  Service: Endoscopy;  Laterality: N/A;   ESOPHAGOGASTRODUODENOSCOPY (EGD) WITH PROPOFOL  N/A 08/25/2018   Procedure: ESOPHAGOGASTRODUODENOSCOPY (EGD) WITH PROPOFOL ;  Surgeon: Golda Claudis PENNER, MD;  [facility];  Service: Endoscopy;  Laterality: N/A;  9:30    Family History  Problem Relation Age of Onset   Depression Mother    Depression  Father    Depression Sister    Anxiety disorder Sister    Anxiety disorder Sister    Alcohol  abuse Neg Hx    Drug abuse Neg Hx    Social History   Tobacco Use   Smoking status: Former    Current packs/day: 0.25    Average packs/day: 0.3 packs/day for 50.8 years (12.7 ttl pk-yrs)    Types: Cigarettes    Start date: 06/02/2020   Smokeless tobacco: Never  Substance Use Topics   Alcohol  use: No    Alcohol /week: 0.0 standard drinks of alcohol    Drug use: No    Allergies   Allergen Reactions   Codeine Nausea Only    Other Reaction(s): Not available   Ferrous Sulfate     She is not allergic  Other Reaction(s): Not available   Levofloxacin     Other Reaction(s): Not available   Prednisone Hypertension and Palpitations    Other Reaction(s): Not available    Current Meds  Medication Sig   amitriptyline  (ELAVIL ) 50 MG tablet TAKE ONE (1) TABLET BY MOUTH EVERY DAY   buPROPion  (WELLBUTRIN  XL) 150 MG 24 hr tablet Take 1 tablet (150 mg total) by mouth daily.   busPIRone  (BUSPAR ) 15 MG tablet Take 1 tablet (15 mg total) by mouth 2 (two) times daily.   diazepam  (VALIUM ) 5 MG tablet TAKE ONE TABLET (5MG  TOTAL) BY MOUTH TWOTIMES DAILY   DULoxetine  (CYMBALTA ) 20 MG capsule Take 1 capsule (20 mg total) by mouth daily.   levocetirizine (XYZAL ) 5 MG tablet Take 1 tablet (5 mg total) by mouth every evening.   metoCLOPramide  (REGLAN ) 5 MG tablet TAKE ONE TABLET (5MG  TOTAL) BY MOUTH TWOTIMES DAILY   oxyCODONE  (OXY IR/ROXICODONE ) 5 MG immediate release tablet Take 1 tablet (5 mg total) by mouth 2 (two) times daily as needed for severe pain (pain score 7-10).   pantoprazole  (PROTONIX ) 40 MG tablet TAKE ONE TABLET (40MG  TOTAL) BY MOUTH ONCE A DAY AS NEEDED FOR UP TO ONE DOSE   rosuvastatin  (CRESTOR ) 5 MG tablet Take 1 tablet (5 mg total) by mouth daily.   Vitamin D , Cholecalciferol , 10 MCG (400 UNIT) TABS Take 400 Units by mouth at bedtime.    Objective: BP 129/80   Pulse 80   Ht 5' 3 (1.6 m)   Wt 127 lb (57.6 kg)   BMI 22.50 kg/m   Physical Exam:  General: Alert and oriented. and No acute distress. Gait: Slow, steady gait.  Bilateral knees without deformity.  Minimal swelling.  Neutral overall alignment.  She has good range of motion.  Crepitus with range of motion testing.  No increased laxity to varus or valgus stress bilaterally.  Negative Lachman bilaterally.  Tenderness to palpation along the medial lateral joint line in the right knee.  The left knee is not  as tender.  IMAGING: I personally ordered and reviewed the following images   X-rays of bilateral knees were obtained in clinic today.  No acute injuries are noted.  Neutral overall alignment.  Moderate loss of joint space within the right knee.  Left knee with mild to moderate joint loss in the medial and lateral compartment.  Patella is tracking laterally in bilateral knees.  There are some osteophytes within the right knee.  No significant osteophytes in the left knee.  Impression: Bilateral knee x-rays with mild to moderate arthritis, slightly worse on the right    New Medications:  No orders of the defined types were placed in this encounter.  Oneil DELENA Horde, MD  01/17/2024 1:52 PM

## 2024-01-17 NOTE — Patient Instructions (Signed)

## 2024-01-18 ENCOUNTER — Other Ambulatory Visit: Payer: Self-pay

## 2024-01-18 DIAGNOSIS — I739 Peripheral vascular disease, unspecified: Secondary | ICD-10-CM

## 2024-01-25 ENCOUNTER — Other Ambulatory Visit: Payer: Self-pay | Admitting: Physical Medicine and Rehabilitation

## 2024-01-26 ENCOUNTER — Telehealth: Payer: Self-pay

## 2024-01-26 NOTE — Telephone Encounter (Signed)
 VOB submitted for euflexxa, bilateral knee.

## 2024-01-30 ENCOUNTER — Encounter (HOSPITAL_COMMUNITY): Payer: Self-pay | Admitting: Registered Nurse

## 2024-01-30 ENCOUNTER — Ambulatory Visit (INDEPENDENT_AMBULATORY_CARE_PROVIDER_SITE_OTHER): Payer: Medicare (Managed Care) | Admitting: Registered Nurse

## 2024-01-30 VITALS — BP 144/73 | HR 80 | Temp 98.2°F | Ht 62.6 in | Wt 127.0 lb

## 2024-01-30 DIAGNOSIS — F331 Major depressive disorder, recurrent, moderate: Secondary | ICD-10-CM | POA: Insufficient documentation

## 2024-01-30 DIAGNOSIS — F411 Generalized anxiety disorder: Secondary | ICD-10-CM | POA: Diagnosis not present

## 2024-01-30 DIAGNOSIS — Z79899 Other long term (current) drug therapy: Secondary | ICD-10-CM | POA: Diagnosis not present

## 2024-01-30 DIAGNOSIS — G4700 Insomnia, unspecified: Secondary | ICD-10-CM | POA: Insufficient documentation

## 2024-01-30 DIAGNOSIS — F4310 Post-traumatic stress disorder, unspecified: Secondary | ICD-10-CM | POA: Insufficient documentation

## 2024-01-30 MED ORDER — SERTRALINE HCL 25 MG PO TABS: ORAL_TABLET | ORAL | 0 refills | Status: DC

## 2024-01-30 MED ORDER — DIAZEPAM 2 MG PO TABS: ORAL_TABLET | ORAL | 0 refills | Status: DC

## 2024-01-30 MED ORDER — BUPROPION HCL ER (XL) 300 MG PO TB24: 300.0000 mg | ORAL_TABLET | Freq: Every day | ORAL | 1 refills | Status: DC

## 2024-01-30 MED ORDER — AMITRIPTYLINE HCL 25 MG PO TABS: 25.0000 mg | ORAL_TABLET | Freq: Every day | ORAL | 1 refills | Status: DC
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
## 2024-01-30 NOTE — Patient Instructions (Addendum)
 Valium  Taper:   You are prescribed Valium  2 mg tablet.  Adjust dosage according to taper below:   Week 1:  Take 2 mg (1 tablet) twice daily as needed for 7 days Week 2:  Take 1 mg (1/2) tablet twice daily as needed for 7 days Week 3:  Take 1 mg (1/2) tablet daily as needed for 7 days Week 4:  Take 1 mg (1/2 tablet every other day as needed for 7 days then stop/discontinue.  Long-term use of benzodiazepines can lead to several adverse effects, including: Cognitive Impairment: Issues such as memory problems, decreased attention, and impaired cognitive abilities. Physical Health Risks: Increased risk of falls and fractures, especially in older adults.  Mental Health Issues: Mood swings, depression, and increased anxiety.  Dependence and Withdrawal: Physical dependence can develop, leading to withdrawal symptoms like irritability, sleep disturbances, and flu-like symptoms.  Social and Occupational Impact: Problems with employment and social interactions.  Gradual reduction under medical supervision is recommended to mitigate withdrawal symptoms.  Alternatives There are several alternatives to benzodiazepines for managing anxiety and related conditions. Some of the common options include: Selective Serotonin Reuptake Inhibitors (SSRIs): These medications, such as sertraline  and fluoxetine , are often used as first-line treatments for anxiety disorders.  Serotonin-Norepinephrine Reuptake Inhibitors (SNRIs): Examples include venlafaxine  and duloxetine , which are also effective for anxiety.  Buspirone : This is a non-benzodiazepine medication specifically for anxiety, with a lower risk of dependence.  Beta-Blockers: Medications like propranolol  can help manage physical symptoms of anxiety, such as rapid heartbeat.  Pregabalin and Gabapentin : These are used for anxiety and have a different mechanism of action compared to benzodiazepines.  Hydroxyzine : An antihistamine that can be used for  anxiety.  With the taper of any medication there is always the possibility of Withdrawal symptoms:  Read information below on discontinuation syndrome.    Antidepressant Discontinuation Syndrome: Withdrawal Symptoms: Tapering off medication can lead to withdrawal symptoms, including brain zaps (electrical shock sensations in the brain). Brain Zaps: Common during antidepressant withdrawal, especially if stopping medication abruptly (cold [place]) rather than tapering off gradually. Managing Brain Zaps: Slower Taper: Gradually reduce medication dosage to minimize brain zaps. If symptoms occur, consider restarting the medication and tapering more slowly. Return to Medication: Some individuals may go back on their medication and then decide to taper off or switch to a different one. Providers may add adjunct medications. Prozac : Transitioning to Prozac , which has a longer half-life, can help reduce brain zaps during the tapering process. Vitamins and Supplements: Omega-3 fatty acids (fish oil) and Vitamin B12 have been reported to help reduce the severity and frequency of brain zaps, though their effectiveness is not scientifically verified. Understanding these strategies can help manage withdrawal symptoms more effectively.  Topic: Benzodiazepines and Opioids Polysubstance Use Risk: Combining opioids with other central nervous system depressants like benzodiazepines, alcohol , or xylazine significantly increases the risk of life-threatening overdose. Statistics: In 2021, nearly 14% of opioid-related overdose deaths also involved benzodiazepines. Mechanism: Benzodiazepines (e.g., Valium , Xanax , Klonopin ) enhance GABA activity in the brain, leading to sedation. Illicit Drug Supply: Benzodiazepines have been found in illicit opioids, sometimes unknowingly consumed. Co-Prescribing Concerns Overdose Risk: Combining opioids and benzodiazepines can suppress breathing and impair cognition, increasing  overdose risk. Emergency Visits: Concurrent use leads to higher rates of ER visits, hospital admissions, and overdose deaths. [place]  Study: Patients prescribed both had a 10x higher overdose death rate than those on opioids alone. Veterans Study: Benzodiazepine prescriptions increased overdose risk in a dose-dependent manner. Guidelines and  Warnings CDC Recommendations: Clinicians should exercise caution when prescribing both drugs together. FDA Boxed Warnings: Both drug types now carry warnings about the dangers of concurrent use. Patient Responsibility: Patients should disclose all medications and substances to their healthcare providers to manage risks.  Safety:  Call 911, 988, mobile crisis, or present to the nearest emergency room should you experience any suicidal/homicidal ideation, auditory/visual/hallucinations, or detrimental worsening of your mental health.  Mobile Crisis Response Teams Listed by counties in vicinity of [facility] providers [facility] [phone] [facility] [phone] [facility] [phone] [facility] [phone] [place]                * [facility] [phone]                * [facility] [phone]  [facility] [phone] [facility] [employer]  [phone] * [facility] [phone]

## 2024-02-06 ENCOUNTER — Other Ambulatory Visit: Payer: Self-pay | Admitting: Family Medicine

## 2024-02-06 DIAGNOSIS — K219 Gastro-esophageal reflux disease without esophagitis: Secondary | ICD-10-CM

## 2024-02-06 NOTE — Telephone Encounter (Unsigned)
 Copied from CRM #[id number]. Topic: Clinical - Medication Refill >> Feb 06, 2024  4:32 PM [name] wrote: Medication: metoCLOPramide  (REGLAN ) 5 MG tablet [[id number]]   pantoprazole  (PROTONIX ) 40 MG tablet [[id number]]   Has the patient contacted their pharmacy? Yes (Agent: If no, request that the patient contact the pharmacy for the refill. If patient does not wish to contact the pharmacy document the reason why and proceed with request.) (Agent: If yes, when and what did the pharmacy advise?)  This is the patient's preferred pharmacy:  [facility] - [place], [place] - [address]  [place] [address] Phone: [phone] Fax: [phone]  Is this the correct pharmacy for this prescription? Yes If no, delete pharmacy and type the correct one.   Has the prescription been filled recently? Yes  Is the patient out of the medication? Yes  Has the patient been seen for an appointment in the last year OR does the patient have an upcoming appointment? Yes  Can we respond through MyChart? No  Agent: Please be advised that Rx refills may take up to 3 business days. We ask that you follow-up with your pharmacy.

## 2024-02-07 MED ORDER — METOCLOPRAMIDE HCL 5 MG PO TABS: 5.0000 mg | ORAL_TABLET | Freq: Two times a day (BID) | ORAL | 2 refills | Status: AC | PRN

## 2024-02-07 MED ORDER — PANTOPRAZOLE SODIUM 40 MG PO TBEC: 40.0000 mg | DELAYED_RELEASE_TABLET | Freq: Every day | ORAL | 2 refills | Status: AC

## 2024-02-10 ENCOUNTER — Telehealth: Payer: Self-pay

## 2024-02-10 ENCOUNTER — Other Ambulatory Visit: Payer: Self-pay | Admitting: Family Medicine

## 2024-02-10 DIAGNOSIS — G4700 Insomnia, unspecified: Secondary | ICD-10-CM

## 2024-02-10 DIAGNOSIS — Z79899 Other long term (current) drug therapy: Secondary | ICD-10-CM

## 2024-02-10 DIAGNOSIS — F411 Generalized anxiety disorder: Secondary | ICD-10-CM

## 2024-02-10 NOTE — Telephone Encounter (Signed)
 Copied from CRM [id number]. Topic: Clinical - Medication Refill >> Feb 10, 2024 12:15 PM Donna E wrote: Medication: diazepam  (VALIUM ) 2 MG tablet  Has the patient contacted their pharmacy? Yes Pharmacy sent over request on 01/30/24  This is the patient's preferred pharmacy:   [facility] - [place], [place] - [address] [place] [address] Phone: [phone] Fax: [phone]  Is this the correct pharmacy for this prescription? Yes If no, delete pharmacy and type the correct one.   Has the prescription been filled recently? Yes  Is the patient out of the medication? Yes  Has the patient been seen for an appointment in the last year OR does the patient have an upcoming appointment? Yes  Can we respond through MyChart? No  Agent: Please be advised that Rx refills may take up to 3 business days. We ask that you follow-up with your pharmacy.   ----------------------------------------------------------------------- From previous Reason for Contact - Prescription Issue: Reason for CRM:

## 2024-02-10 NOTE — Telephone Encounter (Signed)
 Copied from CRM [id number]. Topic: General - Other >> Feb 10, 2024  3:56 PM Turkey A wrote: Reason for CRM: Patient's daughter Niels called to find out when the refill for diazepam  (VALIUM ) 2 MG tablet going to be faxed over to Pharmacy. Agent called [facility] to inquire but a CMA was not available at the time. Niels is upset about this and would like a call back.

## 2024-02-10 NOTE — Telephone Encounter (Signed)
 Copied from CRM [id number]. Topic: Clinical - Prescription Issue >> Feb 10, 2024  4:00 PM [name] wrote: Reason for CRM: Patients daughter [name] called in to follow up on prescription refill diazepam  (VALIUM ) 2 MG tablet advised refused due to a different provider prescribing medication, daughter then stated Dr. Hilario prescirbed medication 7/30. She would like for someone to give a call today regarding script.

## 2024-02-14 ENCOUNTER — Other Ambulatory Visit (HOSPITAL_COMMUNITY): Payer: Self-pay | Admitting: Registered Nurse

## 2024-02-14 ENCOUNTER — Other Ambulatory Visit: Payer: Self-pay | Admitting: Family Medicine

## 2024-02-14 ENCOUNTER — Telehealth (HOSPITAL_COMMUNITY): Payer: Self-pay

## 2024-02-14 ENCOUNTER — Encounter (HOSPITAL_COMMUNITY): Payer: Self-pay | Admitting: Registered Nurse

## 2024-02-14 DIAGNOSIS — Z79899 Other long term (current) drug therapy: Secondary | ICD-10-CM

## 2024-02-14 DIAGNOSIS — F331 Major depressive disorder, recurrent, moderate: Secondary | ICD-10-CM

## 2024-02-14 DIAGNOSIS — G4700 Insomnia, unspecified: Secondary | ICD-10-CM

## 2024-02-14 DIAGNOSIS — F4310 Post-traumatic stress disorder, unspecified: Secondary | ICD-10-CM

## 2024-02-14 DIAGNOSIS — F411 Generalized anxiety disorder: Secondary | ICD-10-CM

## 2024-02-14 MED ORDER — SERTRALINE HCL 25 MG PO TABS: 25.0000 mg | ORAL_TABLET | Freq: Every day | ORAL | 0 refills | Status: DC

## 2024-02-14 MED ORDER — AMITRIPTYLINE HCL 10 MG PO TABS
25.0000 mg | ORAL_TABLET | Freq: Every day | ORAL | 0 refills | Status: DC
Start: 2024-02-14 — End: 2024-02-21

## 2024-02-14 MED ORDER — PROPRANOLOL HCL 10 MG PO TABS: 10.0000 mg | ORAL_TABLET | Freq: Two times a day (BID) | ORAL | 0 refills | Status: DC | PRN

## 2024-02-14 NOTE — Telephone Encounter (Signed)
 Spoke with pt and pt's sister they are going to check with pt's daughter who does her medications and call us  back

## 2024-02-14 NOTE — Telephone Encounter (Signed)
 They did inform that pt is currently taking 25 mg and will increase to 50 mg

## 2024-02-14 NOTE — Telephone Encounter (Signed)
 Pt called in stating that her wellbutrin  and her zoloft  is not working for her, she states that her nerves are all to pieces. She is wanting to know if she can try increasing it. She states that she hasn't left the house since coming for appt 01/30/24. Her next appt is 03/12/24. Not showing anything sooner but 9/15. Please advise.

## 2024-02-14 NOTE — Addendum Note (Signed)
 Addended by: Chamika Cunanan B on: 02/14/2024 04:25 PM   Modules accepted: Orders

## 2024-02-15 ENCOUNTER — Other Ambulatory Visit: Payer: Self-pay | Admitting: Family Medicine

## 2024-02-15 MED ORDER — DIAZEPAM 2 MG PO TABS: 2.0000 mg | ORAL_TABLET | Freq: Two times a day (BID) | ORAL | 0 refills | Status: DC | PRN

## 2024-02-15 NOTE — Telephone Encounter (Signed)
 Patient advised.

## 2024-02-15 NOTE — Telephone Encounter (Signed)
 sent

## 2024-02-21 ENCOUNTER — Other Ambulatory Visit (HOSPITAL_COMMUNITY): Payer: Self-pay | Admitting: Registered Nurse

## 2024-02-21 ENCOUNTER — Telehealth (HOSPITAL_COMMUNITY): Payer: Self-pay

## 2024-02-21 ENCOUNTER — Encounter (HOSPITAL_COMMUNITY): Payer: Self-pay | Admitting: Registered Nurse

## 2024-02-21 DIAGNOSIS — F411 Generalized anxiety disorder: Secondary | ICD-10-CM

## 2024-02-21 DIAGNOSIS — F4310 Post-traumatic stress disorder, unspecified: Secondary | ICD-10-CM

## 2024-02-21 DIAGNOSIS — F331 Major depressive disorder, recurrent, moderate: Secondary | ICD-10-CM

## 2024-02-21 MED ORDER — QUETIAPINE FUMARATE 25 MG PO TABS: 25.0000 mg | ORAL_TABLET | Freq: Every day | ORAL | 1 refills | Status: DC

## 2024-02-21 MED ORDER — QUETIAPINE FUMARATE ER 50 MG PO TB24: 50.0000 mg | ORAL_TABLET | Freq: Every day | ORAL | 1 refills | Status: DC

## 2024-02-21 MED ORDER — AMITRIPTYLINE HCL 10 MG PO TABS: ORAL_TABLET | ORAL | 0 refills | Status: DC

## 2024-02-21 MED ORDER — SERTRALINE HCL 50 MG PO TABS: 50.0000 mg | ORAL_TABLET | Freq: Every day | ORAL | 1 refills | Status: DC

## 2024-02-21 NOTE — Telephone Encounter (Signed)
  [name]  called with complaints that current medication is not working stating "the medication change is not helping her at all", she states that she is still very nervous and unable to leave home or go outside.   Last office visit 01/30/24 and next scheduled visit 03/26/2024.   Recommended the following:  Decrease amitriptyline  to 10 mg daily.  Increase Zoloft  to 50 mg daily.  Start Seroquel  XL 50 mg daily at bedtime and Seroquel  25 mg twice daily as needed for anxiety.  Meds ordered this encounter  Medications   sertraline  (ZOLOFT ) 50 MG tablet    Sig: Take 1 tablet (50 mg total) by mouth daily. Take 12.5 mg (1/2 tablet) daily for 7 days the start 25 mg (1 tablet) daily    Dispense:  30 tablet    Refill:  1    Supervising Provider:   ARFEEN, SYED T [[id number]]   amitriptyline  (ELAVIL ) 10 MG tablet    Sig: Taper:  Take 10 mg  (tablet) daily for 7 days the 10 mg (1 tablet) every other day then stop/discontinue    Dispense:  12 tablet    Refill:  0    Supervising Provider:   CURRY PATERSON T [[id number]]   QUEtiapine  (SEROQUEL  XR) 50 MG TB24 24 hr tablet    Sig: Take 1 tablet (50 mg total) by mouth at bedtime.    Dispense:  30 tablet    Refill:  1    Supervising Provider:   CURRY, SYED T [[id number]]   QUEtiapine  (SEROQUEL ) 25 MG tablet    Sig: Take 1 tablet (25 mg total) by mouth at bedtime.    Dispense:  40 tablet    Refill:  1    Supervising Provider:   CURRY PATERSON T [[id number]]   Jissel Slavens B. Martinez Boxx, NP

## 2024-02-21 NOTE — Telephone Encounter (Signed)
 Pt called in stating that the medication change is not helping her at all, she states that she is still very nervous and unable to leave home or go outside. Pt is scheduled 03/26/24 due to no availability. Please advise.

## 2024-02-21 NOTE — Telephone Encounter (Signed)
 Pt's sister aware will inform daughter

## 2024-02-22 ENCOUNTER — Telehealth (HOSPITAL_COMMUNITY): Payer: Self-pay

## 2024-02-22 MED ORDER — QUETIAPINE FUMARATE 25 MG PO TABS: 25.0000 mg | ORAL_TABLET | Freq: Two times a day (BID) | ORAL | 1 refills | Status: DC | PRN

## 2024-02-22 NOTE — Telephone Encounter (Signed)
 Called pt to let her know new rx was sent in she verbalized understanding

## 2024-02-22 NOTE — Addendum Note (Signed)
 Addended by: Dianna Deshler B on: 02/22/2024 01:23 PM   Modules accepted: Orders

## 2024-02-22 NOTE — Telephone Encounter (Signed)
 [facility] called in inquiring about QUEtiapine  (SEROQUEL ) 25 MG tablet the script directions say at bed where as provider note said twice daily as needed. Please clarify and send new script if needed.

## 2024-02-23 ENCOUNTER — Ambulatory Visit (HOSPITAL_COMMUNITY): Payer: Medicare (Managed Care)

## 2024-02-23 ENCOUNTER — Ambulatory Visit: Payer: Medicare (Managed Care) | Admitting: Vascular Surgery

## 2024-02-28 ENCOUNTER — Encounter: Payer: Medicare (Managed Care) | Admitting: Vascular Surgery

## 2024-02-28 ENCOUNTER — Encounter: Payer: Self-pay | Admitting: Physical Medicine and Rehabilitation

## 2024-02-28 ENCOUNTER — Encounter (HOSPITAL_COMMUNITY): Payer: Medicare (Managed Care)

## 2024-02-28 ENCOUNTER — Encounter
Payer: Medicare (Managed Care) | Attending: Physical Medicine and Rehabilitation | Admitting: Physical Medicine and Rehabilitation

## 2024-02-28 VITALS — BP 179/76 | Ht 62.6 in | Wt 122.0 lb

## 2024-02-28 DIAGNOSIS — E876 Hypokalemia: Secondary | ICD-10-CM | POA: Insufficient documentation

## 2024-02-28 DIAGNOSIS — Z5181 Encounter for therapeutic drug level monitoring: Secondary | ICD-10-CM | POA: Diagnosis not present

## 2024-02-28 DIAGNOSIS — F411 Generalized anxiety disorder: Secondary | ICD-10-CM | POA: Diagnosis not present

## 2024-02-28 DIAGNOSIS — K5900 Constipation, unspecified: Secondary | ICD-10-CM | POA: Diagnosis not present

## 2024-02-28 DIAGNOSIS — G894 Chronic pain syndrome: Secondary | ICD-10-CM | POA: Diagnosis not present

## 2024-02-28 DIAGNOSIS — G629 Polyneuropathy, unspecified: Secondary | ICD-10-CM | POA: Diagnosis not present

## 2024-02-28 DIAGNOSIS — Z79891 Long term (current) use of opiate analgesic: Secondary | ICD-10-CM | POA: Diagnosis not present

## 2024-02-28 DIAGNOSIS — N1831 Chronic kidney disease, stage 3a: Secondary | ICD-10-CM | POA: Diagnosis present

## 2024-02-28 DIAGNOSIS — M47816 Spondylosis without myelopathy or radiculopathy, lumbar region: Secondary | ICD-10-CM | POA: Insufficient documentation

## 2024-02-28 MED ORDER — JOURNAVX 50 MG PO TABS: 1.0000 | ORAL_TABLET | Freq: Two times a day (BID) | ORAL | 3 refills | Status: DC | PRN

## 2024-02-28 MED ORDER — PROPRANOLOL HCL 20 MG PO TABS: 20.0000 mg | ORAL_TABLET | Freq: Two times a day (BID) | ORAL | 3 refills | Status: AC

## 2024-02-28 NOTE — Addendum Note (Signed)
 Addended by: LORILEE SVEN SQUIBB on: 02/28/2024 01:24 PM   Modules accepted: Orders

## 2024-02-28 NOTE — Patient Instructions (Signed)
 Constipation:  -Provided list of following foods that help with constipation and highlighted a few: 1) prunes- contain high amounts of fiber.  2) apples- has a form of dietary fiber called pectin that accelerates stool movement and increases beneficial gut bacteria 3) pears- in addition to fiber, also high in fructose and sorbitol which have laxative effect 4) figs- contain an enzyme ficin which helps to speed colonic transit 5) kiwis- contain an enzyme actinidin that improves gut motility and reduces constipation 6) oranges- rich in pectin (like apples) 7) grapefruits- contain a flavanol naringenin which has a laxative effect 8) vegetables- rich in fiber and also great sources of folate, vitamin C, and K 9) artichoke- high in inulin, prebiotic great for the microbiome 10) chicory- increases stool frequency and softness (can be added to coffee) 11) rhubarb- laxative effect 12) sweet potato- high fiber 13) beans, peas, and lentils- contain both soluble and insoluble fiber 14) chia seeds- improves intestinal health and gut flora 15) flaxseeds- laxative effect 16) whole grain rye bread- high in fiber 17) oat bran- high in soluble and insoluble fiber 18) kefir- softens stools -recommended to try at least one of these foods every day.  -drink 6-8 glasses of water per day -walk regularly, especially after meals.    Foods that may reduce pain: 1) Ginger (especially studied for arthritis)- reduce leukotriene production to decrease inflammation 2) Blueberries- high in phytonutrients that decrease inflammation 3) Salmon- marine omega-3s reduce joint swelling and pain 4) Pumpkin seeds- reduce inflammation 5) dark chocolate- reduces inflammation 6) turmeric- reduces inflammation 7) tart cherries - reduce pain and stiffness 8) extra virgin olive oil - its compound olecanthal helps to block prostaglandins  9) chili peppers- can be eaten or applied topically via capsaicin 10) mint- helpful for  headache, muscle aches, joint pain, and itching 11) garlic- reduces inflammation 12) Green tea- reduces inflammation and oxidative stress, helps with weight loss, may reduce the risk of cancer, recommend Double Green Matcha [place] of Tea daily  Link to further information on diet for chronic pain: [email or web address]

## 2024-02-28 NOTE — Progress Notes (Addendum)
 Subjective:    Patient ID: [name], female    DOB: 1945/12/08, 78 y.o.   MRN: [id number]  [name] is a 78 year old woman who presents to establish care for chronic pan.  1) Fibromyalgia -oxycodone  is not helping -has not tried Journavx  -has tried lyrica, gabapentin , oxycodone  -oxycodone  helped her to function -her pain is present all day -her pain has been present for 50 years -she is now mostly in the house  2) Neuropathy: -has tried lyrica, gabapentin , oxycodone  -present in bilateral feet -family asks for evaluation to assess blood flow  3) Anxiety: -her physician changed her from Valium   -she is on propanolol   Pain Inventory Average Pain 8 Pain Right Now 6 My pain is constant, sharp, and stabbing  In the last 24 hours, has pain interfered with the following? General activity 2 Relation with others 3 Enjoyment of life 1 What TIME of day is your pain at its worst? morning , daytime, evening, and night Sleep (in general) Fair   Pain is worse with: walking, bending, sitting, inactivity, standing, unsure, and some activites Pain improves with: medication Relief from Meds: 0      Family History  Problem Relation Age of Onset   Depression Mother    Depression Father    Depression Sister    Anxiety disorder Sister    Anxiety disorder Sister    Alcohol  abuse Neg Hx    Drug abuse Neg Hx    Social History   Socioeconomic History   Marital status: Widowed    Spouse name: Not on file   Number of children: 1   Years of education: Not on file   Highest education level: High school graduate  Occupational History   Not on file  Tobacco Use   Smoking status: Every Day    Current packs/day: 1.00    Average packs/day: 0.6 packs/day for 97.9 years (62.5 ttl pk-yrs)    Types: Cigarettes    Start date: 72   Smokeless tobacco: Never  Vaping Use   Vaping status: Never Used  Substance and Sexual Activity   Alcohol  use: No    Alcohol /week: 0.0  standard drinks of alcohol    Drug use: No   Sexual activity: Never    Birth control/protection: Surgical  Other Topics Concern   Not on file  Social History Narrative   Not on file   Social Drivers of Health   Financial Resource Strain: Not on file  Food Insecurity: Not on file  Transportation Needs: Not on file  Physical Activity: Not on file  Stress: Not on file  Social Connections: Not on file   Past Surgical History:  Procedure Laterality Date   ABDOMINAL HYSTERECTOMY     BIOPSY  08/25/2018   Procedure: BIOPSY;  Surgeon: Golda Claudis PENNER, MD;  [facility];  Service: Endoscopy;;   CATARACT EXTRACTION Bilateral    CHOLECYSTECTOMY     ESOPHAGEAL DILATION N/A 06/27/2015   Procedure: ESOPHAGEAL DILATION;  Surgeon: Claudis PENNER Golda, MD;  [facility];  Service: Endoscopy;  Laterality: N/A;   ESOPHAGEAL DILATION N/A 08/25/2018   Procedure: ESOPHAGEAL DILATION;  Surgeon: Golda Claudis PENNER, MD;  [facility];  Service: Endoscopy;  Laterality: N/A;   ESOPHAGOGASTRODUODENOSCOPY (EGD) WITH PROPOFOL  N/A 06/27/2015   Procedure: ESOPHAGOGASTRODUODENOSCOPY (EGD) WITH PROPOFOL ;  Surgeon: Claudis PENNER Golda, MD;  [facility];  Service: Endoscopy;  Laterality: N/A;   ESOPHAGOGASTRODUODENOSCOPY (EGD) WITH PROPOFOL  N/A 08/25/2018  Procedure: ESOPHAGOGASTRODUODENOSCOPY (EGD) WITH PROPOFOL ;  Surgeon: Golda Claudis PENNER, MD;  [facility];  Service: Endoscopy;  Laterality: N/A;  9:30   Past Medical History:  Diagnosis Date   Anxiety    Back pain    Fibromyalgia    Hyperlipemia    Hypothyroidism    Neuropathy    Sinusitis    Stroke (HCC)    no deficits   There were no vitals taken for this visit.  Opioid Risk Score:   Fall Risk Score:  `1  Depression screen [facility] 2/9     01/30/2024    3:54 PM 01/30/2024    3:10 PM 12/26/2023    2:30 PM 11/04/2023    3:14 PM 10/05/2023    2:12 PM 09/06/2023    2:11 PM 03/10/2023    3:14 PM  Depression  screen PHQ 2/9  Decreased Interest   1 0 3 3 2   Down, Depressed, Hopeless   1 0 3 3 2   PHQ - 2 Score   2 0 6 6 4   Altered sleeping    1  3 0  Tired, decreased energy    3  3 2   Change in appetite    0  2 1  Feeling bad or failure about yourself     3  3 2   Trouble concentrating    0  3 0  Moving slowly or fidgety/restless    0  0 0  Suicidal thoughts    2  1 0  PHQ-9 Score    9  21 9   Difficult doing work/chores    Very difficult  Extremely dIfficult Somewhat difficult     Information is confidential and restricted. Go to Review Flowsheets to unlock data.    Review of Systems  Musculoskeletal:  Positive for back pain, gait problem and myalgias.  Neurological:  Positive for numbness.  Psychiatric/Behavioral:  Positive for dysphoric mood. The patient is nervous/anxious.   All other systems reviewed and are negative.      Objective:   Physical Exam Gen: no distress, normal appearing HEENT: oral mucosa pink and moist, NCAT Cardio: Reg rate Chest: normal effort, normal rate of breathing Abd: soft, non-distended Ext: no edema Psych: pleasant, normal affect Skin: intact Neuro: Alert and oriented x3 Decreased sensation in bilateral feet, diffuse tender points      Assessment & Plan:   1) Chronic Pain Syndrome secondary to peripheral neuropathy 2/2 prediabetes -discussed that she cannot take prednisone -topamax 25mg  prescribed HS -discussed cymbalta   UDS ordered  D/c oxycodone   -discussed that Journavx  is a highly selective inhibitor for Nav 1.8, which is specific for pain in the peripheral nervous system, discussed that lidocaine  in contrast affects all Nav receptors, discussed that patient can try using this medication as prn for pain, though its studies have focused on its use for acute pain. Discussed that it has been studied against opioids for acute pain with comparable efficacy. Discussed that I have not seen any patient side effects thus far. Discussed that we have  samples available and we have copay cards available. Discussed that outpatient if the medication requires a prior auth the copay should be $30 for at least a 60 day supply. The medication may be more likely to be in stock in [facility] and [facility]. We do have samples available.   -Discussed Qutenza as an option for neuropathic pain control. Discussed that this is a capsaicin patch, stronger than capsaicin cream. Discussed that it is currently approved  for diabetic peripheral neuropathy and post-herpetic neuralgia, but that it has also shown benefit in treating other forms of neuropathy. Provided patient with link to site to learn more about the patch: https://www.clark.biz/. Discussed that the patch would be placed in office and benefits usually last 3 months. Discussed that unintended exposure to capsaicin can cause severe irritation of eyes, mucous membranes, respiratory tract, and skin, but that Qutenza is a local treatment and does not have the systemic side effects of other nerve medications. Discussed that there may be pain, itching, erythema, and decreased sensory function associated with the application of Qutenza. Side effects usually subside within 1 week. A cold pack of analgesic medications can help with these side effects. Blood pressure can also be increased due to pain associated with administration of the patch.   -Discussed current symptoms of pain and history of pain.  -Discussed benefits of exercise in reducing pain. -Discussed following foods that may reduce pain: 1) Ginger (especially studied for arthritis)- reduce leukotriene production to decrease inflammation 2) Blueberries- high in phytonutrients that decrease inflammation 3) Salmon- marine omega-3s reduce joint swelling and pain 4) Pumpkin seeds- reduce inflammation 5) dark chocolate- reduces inflammation 6) turmeric- reduces inflammation 7) tart cherries - reduce pain and stiffness 8) extra virgin olive oil - its compound  olecanthal helps to block prostaglandins  9) chili peppers- can be eaten or applied topically via capsaicin 10) mint- helpful for headache, muscle aches, joint pain, and itching 11) garlic- reduces inflammation 12) Green tea- reduces inflammation and oxidative stress, helps with weight loss, may reduce the risk of cancer, recommend Double [employer] of Tea daily  Link to further information on diet for chronic pain: http://www.bray.com/   2) Depression: -referred to psychiatry -discussed cymbalta   3) Fibromyalgia:  -discussed that she loves to go out and eat but she cannot due to her nerves and pain -discussed PT and she defers -discussed cymbalta    4) Facet arthropathy:  -Discussed Qutenza as an option for neuropathic pain control. Discussed that this is a capsaicin patch, stronger than capsaicin cream. Discussed that it is currently approved for diabetic peripheral neuropathy and post-herpetic neuralgia, but that it has also shown benefit in treating other forms of neuropathy. Provided patient with link to site to learn more about the patch: https://www.clark.biz/. Discussed that the patch would be placed in office and benefits usually last 3 months. Discussed that unintended exposure to capsaicin can cause severe irritation of eyes, mucous membranes, respiratory tract, and skin, but that Qutenza is a local treatment and does not have the systemic side effects of other nerve medications. Discussed that there may be pain, itching, erythema, and decreased sensory function associated with the application of Qutenza. Side effects usually subside within 1 week. A cold pack of analgesic medications can help with these side effects. Blood pressure can also be increased due to pain associated with administration of the patch.   D/c oxycodone  -discussed cymbalta - discussed that she has tried before but cannot recall  response  5) IBS:  -recommended getting prunes  6) Insomnia: -continue Relaxium  7) Constipation:  -Provided list of following foods that help with constipation and highlighted a few: 1) prunes- contain high amounts of fiber.  2) apples- has a form of dietary fiber called pectin that accelerates stool movement and increases beneficial gut bacteria 3) pears- in addition to fiber, also high in fructose and sorbitol which have laxative effect 4) figs- contain an enzyme ficin which helps to speed colonic transit  5) kiwis- contain an enzyme actinidin that improves gut motility and reduces constipation 6) oranges- rich in pectin (like apples) 7) grapefruits- contain a flavanol naringenin which has a laxative effect 8) vegetables- rich in fiber and also great sources of folate, vitamin C, and K 9) artichoke- high in inulin, prebiotic great for the microbiome 10) chicory- increases stool frequency and softness (can be added to coffee) 11) rhubarb- laxative effect 12) sweet potato- high fiber 13) beans, peas, and lentils- contain both soluble and insoluble fiber 14) chia seeds- improves intestinal health and gut flora 15) flaxseeds- laxative effect 16) whole grain rye bread- high in fiber 17) oat bran- high in soluble and insoluble fiber 18) kefir- softens stools -recommended to try at least one of these foods every day.  -drink 6-8 glasses of water per day -walk regularly, especially after meals.   8) Anxiety: -propanolol 20mg  increased to BID -referred to psychiatry in [place] at [facility] -continue amitriptyline  10mg  at night, discussed that one of her physicians is weaning her off of this due to her age  61) Hypokalemia:  -BMP ordered

## 2024-02-29 ENCOUNTER — Encounter (HOSPITAL_BASED_OUTPATIENT_CLINIC_OR_DEPARTMENT_OTHER): Payer: Medicare (Managed Care) | Admitting: Physical Medicine and Rehabilitation

## 2024-02-29 DIAGNOSIS — N1831 Chronic kidney disease, stage 3a: Secondary | ICD-10-CM

## 2024-02-29 LAB — BASIC METABOLIC PANEL WITH GFR
BUN/Creatinine Ratio: 17 (ref 12–28)
BUN: 20 mg/dL (ref 8–27)
CO2: 24 mmol/L (ref 20–29)
Calcium: 9.6 mg/dL (ref 8.7–10.3)
Chloride: 102 mmol/L (ref 96–106)
Creatinine, Ser: 1.17 mg/dL — ABNORMAL HIGH (ref 0.57–1.00)
Glucose: 94 mg/dL (ref 70–99)
Potassium: 4.2 mmol/L (ref 3.5–5.2)
Sodium: 141 mmol/L (ref 134–144)
eGFR: 48 mL/min/1.73 — ABNORMAL LOW (ref >59)

## 2024-02-29 MED ORDER — OXYCODONE HCL 10 MG PO TABA: 10.0000 mg | ORAL_TABLET | Freq: Two times a day (BID) | ORAL | 0 refills | Status: DC

## 2024-02-29 NOTE — Progress Notes (Signed)
 Subjective:    Patient ID: [name], female    DOB: [birth year], 78 y.o.   MRN: [id number]  An audio/video tele-health visit is felt to be the most appropriate encounter for this patient at this time. This is a follow up tele-visit via phone. The patient is at home. MD is at office. Prior to scheduling this appointment, our staff discussed the limitations of evaluation and management by telemedicine and the availability of in-person appointments. The patient expressed understanding and agreed to proceed.   [name] is a 78 year old woman who presents to establish care for chronic pan.  1) Fibromyalgia -oxycodone  is not helping -has not tried Journavx  -has tried lyrica, gabapentin , oxycodone  -oxycodone  helped her to function -her pain is present all day -her pain has been present for 50 years -she is now mostly in the house  2) Neuropathy: -has tried lyrica, gabapentin , oxycodone  -present in bilateral feet -family asks for evaluation to assess blood flow  3) Anxiety: -her physician changed her from Valium   -she is on propanolol  4) History of hypokalemia: -discussed that K+ is currently normal   Pain Inventory Average Pain 8 Pain Right Now 6 My pain is constant, sharp, and stabbing  In the last 24 hours, has pain interfered with the following? General activity 2 Relation with others 3 Enjoyment of life 1 What TIME of day is your pain at its worst? morning , daytime, evening, and night Sleep (in general) Fair   Pain is worse with: walking, bending, sitting, inactivity, standing, unsure, and some activites Pain improves with: medication Relief from Meds: 0      Family History  Problem Relation Age of Onset   Depression Mother    Depression Father    Depression Sister    Anxiety disorder Sister    Anxiety disorder Sister    Alcohol  abuse Neg Hx    Drug abuse Neg Hx    Social History   Socioeconomic History   Marital status: Widowed    Spouse name:  Not on file   Number of children: 1   Years of education: Not on file   Highest education level: High school graduate  Occupational History   Not on file  Tobacco Use   Smoking status: Every Day    Current packs/day: 1.00    Average packs/day: 0.6 packs/day for 97.9 years (62.5 ttl pk-yrs)    Types: Cigarettes    Start date: 80   Smokeless tobacco: Never  Vaping Use   Vaping status: Never Used  Substance and Sexual Activity   Alcohol  use: No    Alcohol /week: 0.0 standard drinks of alcohol    Drug use: No   Sexual activity: Never    Birth control/protection: Surgical  Other Topics Concern   Not on file  Social History Narrative   Not on file   Social Drivers of Health   Financial Resource Strain: Not on file  Food Insecurity: Not on file  Transportation Needs: Not on file  Physical Activity: Not on file  Stress: Not on file  Social Connections: Not on file   Past Surgical History:  Procedure Laterality Date   ABDOMINAL HYSTERECTOMY     BIOPSY  08/25/2018   Procedure: BIOPSY;  Surgeon: Golda Claudis PENNER, MD;  [facility];  Service: Endoscopy;;   CATARACT EXTRACTION Bilateral    CHOLECYSTECTOMY     ESOPHAGEAL DILATION N/A 06/27/2015   Procedure: ESOPHAGEAL DILATION;  Surgeon: Claudis PENNER Golda, MD;  [facility];  Service: Endoscopy;  Laterality: N/A;   ESOPHAGEAL DILATION N/A 08/25/2018   Procedure: ESOPHAGEAL DILATION;  Surgeon: Golda Claudis PENNER, MD;  [facility];  Service: Endoscopy;  Laterality: N/A;   ESOPHAGOGASTRODUODENOSCOPY (EGD) WITH PROPOFOL  N/A 06/27/2015   Procedure: ESOPHAGOGASTRODUODENOSCOPY (EGD) WITH PROPOFOL ;  Surgeon: Claudis PENNER Golda, MD;  [facility];  Service: Endoscopy;  Laterality: N/A;   ESOPHAGOGASTRODUODENOSCOPY (EGD) WITH PROPOFOL  N/A 08/25/2018   Procedure: ESOPHAGOGASTRODUODENOSCOPY (EGD) WITH PROPOFOL ;  Surgeon: Golda Claudis PENNER, MD;  [facility];  Service: Endoscopy;  Laterality: N/A;   9:30   Past Medical History:  Diagnosis Date   Anxiety    Back pain    Fibromyalgia    Hyperlipemia    Hypothyroidism    Neuropathy    Sinusitis    Stroke (HCC)    no deficits   There were no vitals taken for this visit.  Opioid Risk Score:   Fall Risk Score:  `1  Depression screen [facility] 2/9     02/28/2024    1:00 PM 01/30/2024    3:54 PM 01/30/2024    3:10 PM 12/26/2023    2:30 PM 11/04/2023    3:14 PM 10/05/2023    2:12 PM 09/06/2023    2:11 PM  Depression screen PHQ 2/9  Decreased Interest 1   1 0 3 3  Down, Depressed, Hopeless 1   1 0 3 3  PHQ - 2 Score 2   2 0 6 6  Altered sleeping     1  3  Tired, decreased energy     3  3  Change in appetite     0  2  Feeling bad or failure about yourself      3  3  Trouble concentrating     0  3  Moving slowly or fidgety/restless     0  0  Suicidal thoughts     2  1  PHQ-9 Score     9  21  Difficult doing work/chores     Very difficult  Extremely dIfficult     Information is confidential and restricted. Go to Review Flowsheets to unlock data.    Review of Systems  Musculoskeletal:  Positive for back pain, gait problem and myalgias.  Neurological:  Positive for numbness.  Psychiatric/Behavioral:  Positive for dysphoric mood. The patient is nervous/anxious.   All other systems reviewed and are negative.      Objective:   Physical Exam Gen: no distress, normal appearing HEENT: oral mucosa pink and moist, NCAT Cardio: Reg rate Chest: normal effort, normal rate of breathing Abd: soft, non-distended Ext: no edema Psych: pleasant, normal affect Skin: intact Neuro: Alert and oriented x3 Decreased sensation in bilateral feet, diffuse tender points      Assessment & Plan:   1) Chronic Pain Syndrome secondary to peripheral neuropathy 2/2 prediabetes -discussed that she cannot take prednisone -topamax 25mg  prescribed HS -discussed cymbalta   UDS ordered  Increase oxycodone  to 10mg  BID prn as 5mg  dose is no longer  providing relief and Journavx  copay was too expensive for her  -Discussed Qutenza as an option for neuropathic pain control. Discussed that this is a capsaicin patch, stronger than capsaicin cream. Discussed that it is currently approved for diabetic peripheral neuropathy and post-herpetic neuralgia, but that it has also shown benefit in treating other forms of neuropathy. Provided patient with link to site to learn more about the patch: https://www.clark.biz/. Discussed that the  patch would be placed in office and benefits usually last 3 months. Discussed that unintended exposure to capsaicin can cause severe irritation of eyes, mucous membranes, respiratory tract, and skin, but that Qutenza is a local treatment and does not have the systemic side effects of other nerve medications. Discussed that there may be pain, itching, erythema, and decreased sensory function associated with the application of Qutenza. Side effects usually subside within 1 week. A cold pack of analgesic medications can help with these side effects. Blood pressure can also be increased due to pain associated with administration of the patch.   -Discussed current symptoms of pain and history of pain.  -Discussed benefits of exercise in reducing pain. -Discussed following foods that may reduce pain: 1) Ginger (especially studied for arthritis)- reduce leukotriene production to decrease inflammation 2) Blueberries- high in phytonutrients that decrease inflammation 3) Salmon- marine omega-3s reduce joint swelling and pain 4) Pumpkin seeds- reduce inflammation 5) dark chocolate- reduces inflammation 6) turmeric- reduces inflammation 7) tart cherries - reduce pain and stiffness 8) extra virgin olive oil - its compound olecanthal helps to block prostaglandins  9) chili peppers- can be eaten or applied topically via capsaicin 10) mint- helpful for headache, muscle aches, joint pain, and itching 11) garlic- reduces inflammation 12)  Green tea- reduces inflammation and oxidative stress, helps with weight loss, may reduce the risk of cancer, recommend Double [employer] of Tea daily  Link to further information on diet for chronic pain: http://www.bray.com/   2) Depression: -referred to psychiatry -discussed cymbalta   3) Fibromyalgia:  -discussed that she loves to go out and eat but she cannot due to her nerves and pain -discussed PT and she defers -discussed cymbalta    4) Facet arthropathy:  -Discussed Qutenza as an option for neuropathic pain control. Discussed that this is a capsaicin patch, stronger than capsaicin cream. Discussed that it is currently approved for diabetic peripheral neuropathy and post-herpetic neuralgia, but that it has also shown benefit in treating other forms of neuropathy. Provided patient with link to site to learn more about the patch: https://www.clark.biz/. Discussed that the patch would be placed in office and benefits usually last 3 months. Discussed that unintended exposure to capsaicin can cause severe irritation of eyes, mucous membranes, respiratory tract, and skin, but that Qutenza is a local treatment and does not have the systemic side effects of other nerve medications. Discussed that there may be pain, itching, erythema, and decreased sensory function associated with the application of Qutenza. Side effects usually subside within 1 week. A cold pack of analgesic medications can help with these side effects. Blood pressure can also be increased due to pain associated with administration of the patch.   D/c oxycodone  -discussed cymbalta - discussed that she has tried before but cannot recall response  5) IBS:  -recommended getting prunes  6) Insomnia: -continue Relaxium  7) Constipation:  -Provided list of following foods that help with constipation and highlighted a few: 1) prunes- contain high  amounts of fiber.  2) apples- has a form of dietary fiber called pectin that accelerates stool movement and increases beneficial gut bacteria 3) pears- in addition to fiber, also high in fructose and sorbitol which have laxative effect 4) figs- contain an enzyme ficin which helps to speed colonic transit 5) kiwis- contain an enzyme actinidin that improves gut motility and reduces constipation 6) oranges- rich in pectin (like apples) 7) grapefruits- contain a flavanol naringenin which has a laxative effect 8) vegetables- rich in fiber  and also great sources of folate, vitamin C, and K 9) artichoke- high in inulin, prebiotic great for the microbiome 10) chicory- increases stool frequency and softness (can be added to coffee) 11) rhubarb- laxative effect 12) sweet potato- high fiber 13) beans, peas, and lentils- contain both soluble and insoluble fiber 14) chia seeds- improves intestinal health and gut flora 15) flaxseeds- laxative effect 16) whole grain rye bread- high in fiber 17) oat bran- high in soluble and insoluble fiber 18) kefir- softens stools -recommended to try at least one of these foods every day.  -drink 6-8 glasses of water per day -walk regularly, especially after meals.   8) Anxiety: -propanolol 20mg  increased to BID -referred to psychiatry in [place] at [facility] -continue amitriptyline  10mg  at night, discussed that one of her physicians is weaning her off of this due to her age  37) Hypokalemia:  -discussed that K+ is normal on 9/17 BMP  10) Chronic kidney disease: -discussed that her recent Cr is 1.17  11) Hand arthritis: -recommended voltaren gel   5 minutes spent in discussion of her K+ which was normal on recent BMP, discussed that creatinine is elevated at 1.17 but is improved from May reason, discussed that she has been having hand pain, recommended voltaren gel for her hand pain

## 2024-03-02 LAB — TOXASSURE SELECT,+ANTIDEPR,UR

## 2024-03-05 ENCOUNTER — Telehealth (HOSPITAL_COMMUNITY): Payer: Self-pay

## 2024-03-05 NOTE — Telephone Encounter (Signed)
 Pt's sister called in stating that pt is now not eating, sees her childhood in her mind 24/7, crying hysterically, not sleeping, very anxious, and now in physical pain. Shuvon had changed medication to see if it would help. Pt's sister states this has been going on for sometime and is scared pt will be back in the hospital as this happened some time before. Advised of [facility] urgent care locations. Please advise as [facility] is out

## 2024-03-06 NOTE — Telephone Encounter (Signed)
 Spoke with pt's sister advised of Dr Carman message she verbalized understanding

## 2024-03-12 ENCOUNTER — Ambulatory Visit (HOSPITAL_COMMUNITY): Payer: Medicare (Managed Care) | Admitting: Registered Nurse

## 2024-03-26 ENCOUNTER — Encounter (HOSPITAL_COMMUNITY): Payer: Self-pay | Admitting: Registered Nurse

## 2024-03-26 ENCOUNTER — Ambulatory Visit (HOSPITAL_COMMUNITY): Payer: Medicare (Managed Care) | Admitting: Registered Nurse

## 2024-03-26 DIAGNOSIS — F411 Generalized anxiety disorder: Secondary | ICD-10-CM

## 2024-03-26 DIAGNOSIS — F331 Major depressive disorder, recurrent, moderate: Secondary | ICD-10-CM | POA: Diagnosis not present

## 2024-03-26 DIAGNOSIS — F4310 Post-traumatic stress disorder, unspecified: Secondary | ICD-10-CM

## 2024-03-26 DIAGNOSIS — G4700 Insomnia, unspecified: Secondary | ICD-10-CM | POA: Diagnosis not present

## 2024-03-26 MED ORDER — DIAZEPAM 2 MG PO TABS: 2.0000 mg | ORAL_TABLET | Freq: Two times a day (BID) | ORAL | 0 refills | Status: DC | PRN

## 2024-03-26 MED ORDER — BUPROPION HCL ER (XL) 300 MG PO TB24: 300.0000 mg | ORAL_TABLET | Freq: Every day | ORAL | 1 refills | Status: DC

## 2024-03-26 MED ORDER — QUETIAPINE FUMARATE ER 150 MG PO TB24: 150.0000 mg | ORAL_TABLET | Freq: Every day | ORAL | 0 refills | Status: DC

## 2024-03-26 MED ORDER — AMITRIPTYLINE HCL 25 MG PO TABS: 25.0000 mg | ORAL_TABLET | Freq: Every day | ORAL | 0 refills | Status: DC
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
## 2024-03-26 NOTE — Patient Instructions (Signed)

## 2024-04-03 ENCOUNTER — Encounter
Payer: Medicare (Managed Care) | Attending: Physical Medicine and Rehabilitation | Admitting: Physical Medicine and Rehabilitation

## 2024-04-03 ENCOUNTER — Telehealth: Payer: Self-pay

## 2024-04-03 DIAGNOSIS — G894 Chronic pain syndrome: Secondary | ICD-10-CM | POA: Insufficient documentation

## 2024-04-03 NOTE — Progress Notes (Signed)
 Left message to discuss her pain

## 2024-04-03 NOTE — Telephone Encounter (Signed)
 Patient stated she stopped taking the Oxycodone  10 MG 3 given for Fibromyalgia, 3 weeks ago. Because the Oxycodone  gave her nausea with vomiting.    Today she complains of a level 8 groin pain - that started 3 days ago with no other complaints. Patient is wanting another pain medication sent to the pharmacy.  Patient has been advised to seek care at an Urgent Care with [facility]. However Dr. Lorilee will be informed.  Call back phone [phone].

## 2024-04-03 NOTE — Telephone Encounter (Signed)
 Message routed to me, but nothing on it.

## 2024-04-04 NOTE — Telephone Encounter (Signed)
 Dr. Lorilee is would like a telephone visit scheduled for [name].

## 2024-04-05 NOTE — Progress Notes (Unsigned)
 Patient ID: [name], female   DOB: 1946-02-18, 78 y.o.   MRN: [id number]  Reason for Consult: No chief complaint on file.   Referred by Lorilee Sven SQUIBB, MD  Subjective:     HPI [name] is a 78 y.o. female ***  Past Medical History:  Diagnosis Date   Anxiety    Back pain    Fibromyalgia    Hyperlipemia    Hypothyroidism    Neuropathy    Sinusitis    Stroke (HCC)    no deficits   Family History  Problem Relation Age of Onset   Depression Mother    Depression Father    Depression Sister    Anxiety disorder Sister    Anxiety disorder Sister    Alcohol  abuse Neg Hx    Drug abuse Neg Hx    Past Surgical History:  Procedure Laterality Date   ABDOMINAL HYSTERECTOMY     BIOPSY  08/25/2018   Procedure: BIOPSY;  Surgeon: Golda Claudis PENNER, MD;  [facility];  Service: Endoscopy;;   CATARACT EXTRACTION Bilateral    CHOLECYSTECTOMY     ESOPHAGEAL DILATION N/A 06/27/2015   Procedure: ESOPHAGEAL DILATION;  Surgeon: Claudis PENNER Golda, MD;  [facility];  Service: Endoscopy;  Laterality: N/A;   ESOPHAGEAL DILATION N/A 08/25/2018   Procedure: ESOPHAGEAL DILATION;  Surgeon: Golda Claudis PENNER, MD;  [facility];  Service: Endoscopy;  Laterality: N/A;   ESOPHAGOGASTRODUODENOSCOPY (EGD) WITH PROPOFOL  N/A 06/27/2015   Procedure: ESOPHAGOGASTRODUODENOSCOPY (EGD) WITH PROPOFOL ;  Surgeon: Claudis PENNER Golda, MD;  [facility];  Service: Endoscopy;  Laterality: N/A;   ESOPHAGOGASTRODUODENOSCOPY (EGD) WITH PROPOFOL  N/A 08/25/2018   Procedure: ESOPHAGOGASTRODUODENOSCOPY (EGD) WITH PROPOFOL ;  Surgeon: Golda Claudis PENNER, MD;  [facility];  Service: Endoscopy;  Laterality: N/A;  9:30    Short Social History:  Social History   Tobacco Use   Smoking status: Every Day    Current packs/day: 1.00    Average packs/day: 0.6 packs/day for 98.0 years (62.6 ttl pk-yrs)    Types: Cigarettes    Start date: 1975   Smokeless tobacco: Never   Substance Use Topics   Alcohol  use: No    Alcohol /week: 0.0 standard drinks of alcohol     Allergies  Allergen Reactions   Codeine Nausea Only    Other Reaction(s): Not available   Ferrous Sulfate     She is not allergic  Other Reaction(s): Not available   Levofloxacin     Other Reaction(s): Not available   Prednisone Hypertension and Palpitations    Other Reaction(s): Not available    Current Outpatient Medications  Medication Sig Dispense Refill   amitriptyline  (ELAVIL ) 25 MG tablet Take 1 tablet (25 mg total) by mouth at bedtime. Taper:  Take 10 mg  (tablet) daily for 7 days the 10 mg (1 tablet) every other day then stop/discontinue 30 tablet 0   buPROPion  (WELLBUTRIN  XL) 300 MG 24 hr tablet Take 1 tablet (300 mg total) by mouth daily. 30 tablet 1   diazepam  (VALIUM ) 2 MG tablet Take 1 tablet (2 mg total) by mouth 2 (two) times daily as needed for anxiety. 60 tablet 0   JOURNAVX  50 MG TABS Take 1 tablet by mouth 2 (two) times daily as needed. 30 tablet 3   levocetirizine (XYZAL ) 5 MG tablet Take 1 tablet (5 mg total) by mouth every evening. 30 tablet 2   metoCLOPramide  (REGLAN ) 5 MG tablet Take 1 tablet (  5 mg total) by mouth 2 (two) times daily as needed for nausea. 60 tablet 2   oxyCODONE  HCl 10 MG TABA Take 10 mg by mouth 2 (two) times daily. 60 tablet 0   pantoprazole  (PROTONIX ) 40 MG tablet Take 1 tablet (40 mg total) by mouth daily. 30 tablet 2   propranolol  (INDERAL ) 20 MG tablet Take 1 tablet (20 mg total) by mouth 2 (two) times daily. 180 tablet 3   QUEtiapine  Fumarate (SEROQUEL  XR) 150 MG 24 hr tablet Take 1 tablet (150 mg total) by mouth at bedtime. 30 tablet 0   rosuvastatin  (CRESTOR ) 5 MG tablet Take 1 tablet (5 mg total) by mouth daily. 30 tablet 3   Vitamin D , Cholecalciferol , 10 MCG (400 UNIT) TABS Take 400 Units by mouth at bedtime. 150 tablet 3   No current facility-administered medications for this visit.    REVIEW OF SYSTEMS  All other systems were  reviewed and are negative     Objective:  Objective   There were no vitals filed for this visit. There is no height or weight on file to calculate BMI.  Physical Exam General: no acute distress Cardiac: hemodynamically stable Pulm: normal work of breathing Abdomen: non-tender, no pulsatile mass*** Neuro: alert, no focal deficit Extremities: no edema, cyanosis or wounds*** Vascular:   Right: ***  Left: ***  Data: ABI ***  BMP reviewed, creatinine 1.1  A1c reviewed, 5.6     Assessment/Plan:   [name] is a 78 y.o. female with ***  Recommendations to optimize cardiovascular risk: Abstinence from all tobacco products. Blood glucose control with goal A1c < 7%. Blood pressure control with goal blood pressure < 140/90 mmHg. Lipid reduction therapy with goal LDL-C <100 mg/dL  Aspirin  81mg  PO QD.  Atorvastatin  40-80mg  PO QD (or other high intensity statin therapy).   Norman GORMAN Serve MD [facility]

## 2024-04-06 ENCOUNTER — Ambulatory Visit (HOSPITAL_COMMUNITY)
Admission: RE | Admit: 2024-04-06 | Discharge: 2024-04-06 | Disposition: A | Discharge status: Home / Self Care | Payer: Medicare (Managed Care) | Source: Ambulatory Visit | Attending: Vascular Surgery | Admitting: Vascular Surgery

## 2024-04-06 ENCOUNTER — Ambulatory Visit (INDEPENDENT_AMBULATORY_CARE_PROVIDER_SITE_OTHER): Payer: Medicare (Managed Care) | Admitting: Vascular Surgery

## 2024-04-06 ENCOUNTER — Encounter: Payer: Self-pay | Admitting: Vascular Surgery

## 2024-04-06 VITALS — BP 134/73 | HR 50 | Temp 97.7°F | Ht 62.0 in | Wt 117.0 lb

## 2024-04-06 DIAGNOSIS — I739 Peripheral vascular disease, unspecified: Secondary | ICD-10-CM | POA: Diagnosis not present

## 2024-04-06 DIAGNOSIS — G629 Polyneuropathy, unspecified: Secondary | ICD-10-CM | POA: Diagnosis not present

## 2024-04-06 LAB — VAS US ABI WITH/WO TBI
Left ABI: 1.13
Right ABI: 1.1

## 2024-04-16 ENCOUNTER — Ambulatory Visit (INDEPENDENT_AMBULATORY_CARE_PROVIDER_SITE_OTHER): Payer: Medicare (Managed Care) | Admitting: Registered Nurse

## 2024-04-16 ENCOUNTER — Encounter (HOSPITAL_COMMUNITY): Payer: Self-pay | Admitting: Registered Nurse

## 2024-04-16 ENCOUNTER — Encounter: Payer: Self-pay | Admitting: Radiology

## 2024-04-16 VITALS — BP 136/77 | HR 94 | Ht 62.0 in | Wt 118.2 lb

## 2024-04-16 DIAGNOSIS — F411 Generalized anxiety disorder: Secondary | ICD-10-CM

## 2024-04-16 DIAGNOSIS — F331 Major depressive disorder, recurrent, moderate: Secondary | ICD-10-CM | POA: Diagnosis not present

## 2024-04-16 DIAGNOSIS — F4310 Post-traumatic stress disorder, unspecified: Secondary | ICD-10-CM | POA: Diagnosis not present

## 2024-04-16 MED ORDER — QUETIAPINE FUMARATE ER 150 MG PO TB24: 150.0000 mg | ORAL_TABLET | Freq: Every day | ORAL | 1 refills | Status: AC

## 2024-04-16 MED ORDER — AMITRIPTYLINE HCL 25 MG PO TABS: 25.0000 mg | ORAL_TABLET | Freq: Every day | ORAL | 1 refills | Status: DC

## 2024-04-16 MED ORDER — BUSPIRONE HCL 15 MG PO TABS: 15.0000 mg | ORAL_TABLET | Freq: Three times a day (TID) | ORAL | 1 refills | Status: AC

## 2024-04-16 MED ORDER — BUPROPION HCL ER (XL) 300 MG PO TB24: 300.0000 mg | ORAL_TABLET | Freq: Every day | ORAL | 1 refills | Status: AC

## 2024-04-16 MED ORDER — QUETIAPINE FUMARATE 25 MG PO TABS: 25.0000 mg | ORAL_TABLET | Freq: Every day | ORAL | 1 refills | Status: AC | PRN

## 2024-04-16 NOTE — Patient Instructions (Signed)
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
## 2024-04-18 DIAGNOSIS — K219 Gastro-esophageal reflux disease without esophagitis: Secondary | ICD-10-CM | POA: Diagnosis not present

## 2024-04-18 DIAGNOSIS — E785 Hyperlipidemia, unspecified: Secondary | ICD-10-CM | POA: Diagnosis not present

## 2024-04-18 DIAGNOSIS — M797 Fibromyalgia: Secondary | ICD-10-CM | POA: Diagnosis not present

## 2024-04-18 DIAGNOSIS — Z79899 Other long term (current) drug therapy: Secondary | ICD-10-CM | POA: Diagnosis not present

## 2024-04-18 DIAGNOSIS — G4700 Insomnia, unspecified: Secondary | ICD-10-CM | POA: Diagnosis not present

## 2024-04-18 DIAGNOSIS — M170 Bilateral primary osteoarthritis of knee: Secondary | ICD-10-CM | POA: Diagnosis not present

## 2024-04-18 DIAGNOSIS — G629 Polyneuropathy, unspecified: Secondary | ICD-10-CM | POA: Diagnosis not present

## 2024-04-18 DIAGNOSIS — I10 Essential (primary) hypertension: Secondary | ICD-10-CM | POA: Diagnosis not present

## 2024-04-18 DIAGNOSIS — Z0000 Encounter for general adult medical examination without abnormal findings: Secondary | ICD-10-CM | POA: Diagnosis not present

## 2024-04-18 DIAGNOSIS — F411 Generalized anxiety disorder: Secondary | ICD-10-CM | POA: Diagnosis not present

## 2024-05-08 ENCOUNTER — Encounter
Payer: Medicare (Managed Care) | Attending: Physical Medicine and Rehabilitation | Admitting: Physical Medicine and Rehabilitation

## 2024-05-08 ENCOUNTER — Telehealth: Payer: Self-pay | Admitting: *Deleted

## 2024-05-08 ENCOUNTER — Other Ambulatory Visit: Payer: Self-pay | Admitting: Physical Medicine and Rehabilitation

## 2024-05-08 ENCOUNTER — Encounter: Payer: Self-pay | Admitting: Physical Medicine and Rehabilitation

## 2024-05-08 VITALS — BP 150/88 | HR 89 | Ht 62.0 in | Wt 120.0 lb

## 2024-05-08 DIAGNOSIS — F331 Major depressive disorder, recurrent, moderate: Secondary | ICD-10-CM | POA: Insufficient documentation

## 2024-05-08 DIAGNOSIS — R1032 Left lower quadrant pain: Secondary | ICD-10-CM | POA: Insufficient documentation

## 2024-05-08 DIAGNOSIS — M797 Fibromyalgia: Secondary | ICD-10-CM | POA: Insufficient documentation

## 2024-05-08 DIAGNOSIS — F32A Depression, unspecified: Secondary | ICD-10-CM | POA: Diagnosis present

## 2024-05-08 DIAGNOSIS — F411 Generalized anxiety disorder: Secondary | ICD-10-CM | POA: Insufficient documentation

## 2024-05-08 MED ORDER — AMITRIPTYLINE HCL 50 MG PO TABS: 50.0000 mg | ORAL_TABLET | Freq: Every day | ORAL | 3 refills | Status: AC

## 2024-05-08 MED ORDER — AMITRIPTYLINE HCL 25 MG PO TABS
25.0000 mg | ORAL_TABLET | Freq: Every day | ORAL | 3 refills | Status: DC
Start: 2024-05-08 — End: 2024-05-08

## 2024-05-08 MED ORDER — DIAZEPAM 2 MG PO TABS: 2.0000 mg | ORAL_TABLET | Freq: Three times a day (TID) | ORAL | 0 refills | Status: DC | PRN

## 2024-05-08 NOTE — Progress Notes (Addendum)
 Subjective:    Patient ID: [name], female    DOB: 03/29/1946, 78 y.o.   MRN: [id number]   [name] is a 78 year old woman who presents to establish care for chronic pan.  1) Fibromyalgia -oxycodone  is not helping -has not tried Journavx  -has tried lyrica, gabapentin , oxycodone  -oxycodone  helped her to function -her pain is present all day -her pain has been present for 50 years -she is now mostly in the house  2) Neuropathy: -has tried lyrica, gabapentin , oxycodone  -present in bilateral feet -family asks for evaluation to assess blood flow -amitriptyline  was helping with her pain-her is taking amitriptyline , she thinks she taking 10mg   3) Anxiety: -she is on propanolol -she in on valium  and this is benefiting her  4) History of hypokalemia: -discussed that K+ is currently normal  5) Depression: -this has been severe   Pain Inventory Average Pain 8 Pain Right Now 9 My pain is constant, sharp, aching, and throb  In the last 24 hours, has pain interfered with the following? General activity 10 Relation with others 10 Enjoyment of life 10 What TIME of day is your pain at its worst? morning , daytime, evening, and night Sleep (in general) Good   Pain is worse with: walking and sitting Pain improves with: nothing Relief from Meds: 0      Family History  Problem Relation Age of Onset   Depression Mother    Depression Father    Depression Sister    Anxiety disorder Sister    Anxiety disorder Sister    Alcohol  abuse Neg Hx    Drug abuse Neg Hx    Social History   Socioeconomic History   Marital status: Widowed    Spouse name: Not on file   Number of children: 1   Years of education: Not on file   Highest education level: High school graduate  Occupational History   Not on file  Tobacco Use   Smoking status: Every Day    Current packs/day: 1.00    Average packs/day: 0.6 packs/day for 98.1 years (62.7 ttl pk-yrs)    Types: Cigarettes     Start date: 70   Smokeless tobacco: Never  Vaping Use   Vaping status: Never Used  Substance and Sexual Activity   Alcohol  use: No    Alcohol /week: 0.0 standard drinks of alcohol    Drug use: No   Sexual activity: Never    Birth control/protection: Surgical  Other Topics Concern   Not on file  Social History Narrative   Not on file   Social Drivers of Health   Financial Resource Strain: Not on file  Food Insecurity: Not on file  Transportation Needs: Not on file  Physical Activity: Not on file  Stress: Not on file  Social Connections: Not on file   Past Surgical History:  Procedure Laterality Date   ABDOMINAL HYSTERECTOMY     BIOPSY  08/25/2018   Procedure: BIOPSY;  Surgeon: Golda Claudis PENNER, MD;  Location: [facility];  Service: Endoscopy;;   CATARACT EXTRACTION Bilateral    CHOLECYSTECTOMY     ESOPHAGEAL DILATION N/A 06/27/2015   Procedure: ESOPHAGEAL DILATION;  Surgeon: Claudis PENNER Golda, MD;  Location: [facility];  Service: Endoscopy;  Laterality: N/A;   ESOPHAGEAL DILATION N/A 08/25/2018   Procedure: ESOPHAGEAL DILATION;  Surgeon: Golda Claudis PENNER, MD;  Location: [facility];  Service: Endoscopy;  Laterality: N/A;   ESOPHAGOGASTRODUODENOSCOPY (EGD) WITH PROPOFOL  N/A 06/27/2015   Procedure:  ESOPHAGOGASTRODUODENOSCOPY (EGD) WITH PROPOFOL ;  Surgeon: Claudis RAYMOND Rivet, MD;  Location: [facility];  Service: Endoscopy;  Laterality: N/A;   ESOPHAGOGASTRODUODENOSCOPY (EGD) WITH PROPOFOL  N/A 08/25/2018   Procedure: ESOPHAGOGASTRODUODENOSCOPY (EGD) WITH PROPOFOL ;  Surgeon: Rivet Claudis RAYMOND, MD;  Location: [facility];  Service: Endoscopy;  Laterality: N/A;  9:30   Past Medical History:  Diagnosis Date   Anxiety    Back pain    Fibromyalgia    Hyperlipemia    Hypothyroidism    Neuropathy    Sinusitis    Stroke (HCC)    no deficits   BP (!) 166/85   Pulse 89   Ht 5' 2 (1.575 m)   Wt 120 lb (54.4 kg)   SpO2 96%   BMI 21.95 kg/m   Opioid Risk Score:   Fall Risk  Score:  `1  Depression screen PHQ 2/9     05/08/2024    1:28 PM 03/26/2024    2:33 PM 02/28/2024    1:00 PM 01/30/2024    3:54 PM 01/30/2024    3:10 PM 12/26/2023    2:30 PM 11/04/2023    3:14 PM  Depression screen PHQ 2/9  Decreased Interest 1  1   1  0  Down, Depressed, Hopeless   1   1 0  PHQ - 2 Score 1  2   2  0  Altered sleeping       1  Tired, decreased energy       3  Change in appetite       0  Feeling bad or failure about yourself        3  Trouble concentrating       0  Moving slowly or fidgety/restless       0  Suicidal thoughts       2  PHQ-9 Score       9   Difficult doing work/chores       Very difficult     Information is confidential and restricted. Go to Review Flowsheets to unlock data.   Data saved with a previous flowsheet row definition    Review of Systems  Musculoskeletal:  Positive for back pain, gait problem and myalgias.       Left hip & groin pain  Neurological:  Positive for headaches.  Psychiatric/Behavioral:  Positive for dysphoric mood. The patient is nervous/anxious.   All other systems reviewed and are negative.      Objective:   Physical Exam Gen: no distress, normal appearing HEENT: oral mucosa pink and moist, NCAT Cardio: Reg rate Chest: normal effort, normal rate of breathing Abd: soft, non-distended Ext: no edema Psych: pleasant, normal affect Skin: intact Neuro: Alert and oriented x3 Decreased sensation in bilateral feet, diffuse tender points      Assessment & Plan:   1) Chronic Pain Syndrome secondary to peripheral neuropathy 2/2 prediabetes -discussed that she cannot take prednisone -topamax 25mg  prescribed HS -discussed cymbalta  -discussed vagal nerve stimualtor  UDS ordered  Increase oxycodone  to 10mg  BID prn as 5mg  dose is no longer providing relief and Journavx  copay was too expensive for her  -Discussed Qutenza as an option for neuropathic pain control. Discussed that this is a capsaicin patch, stronger than  capsaicin cream. Discussed that it is currently approved for diabetic peripheral neuropathy and post-herpetic neuralgia, but that it has also shown benefit in treating other forms of neuropathy. Provided patient with link to site to learn more about the patch: https://www.clark.biz/. Discussed that the  patch would be placed in office and benefits usually last 3 months. Discussed that unintended exposure to capsaicin can cause severe irritation of eyes, mucous membranes, respiratory tract, and skin, but that Qutenza is a local treatment and does not have the systemic side effects of other nerve medications. Discussed that there may be pain, itching, erythema, and decreased sensory function associated with the application of Qutenza. Side effects usually subside within 1 week. A cold pack of analgesic medications can help with these side effects. Blood pressure can also be increased due to pain associated with administration of the patch.   -Discussed current symptoms of pain and history of pain.  -Discussed benefits of exercise in reducing pain. -Discussed following foods that may reduce pain: 1) Ginger (especially studied for arthritis)- reduce leukotriene production to decrease inflammation 2) Blueberries- high in phytonutrients that decrease inflammation 3) Salmon- marine omega-3s reduce joint swelling and pain 4) Pumpkin seeds- reduce inflammation 5) dark chocolate- reduces inflammation 6) turmeric- reduces inflammation 7) tart cherries - reduce pain and stiffness 8) extra virgin olive oil - its compound olecanthal helps to block prostaglandins  9) chili peppers- can be eaten or applied topically via capsaicin 10) mint- helpful for headache, muscle aches, joint pain, and itching 11) garlic- reduces inflammation 12) Green tea- reduces inflammation and oxidative stress, helps with weight loss, may reduce the risk of cancer, recommend Double [employer] of Tea daily  Link to further  information on diet for chronic pain: http://www.bray.com/   2) Depression: -referred to psychiatry -discussed cymbalta  -continue amitriptyline    3) Fibromyalgia:  -discussed that she loves to go out and eat but she cannot due to her nerves and pain -discussed PT and she defers -discussed cymbalta   -continue amitriptyline , increase to 25mg  HS  4) Facet arthropathy:  -Discussed Qutenza as an option for neuropathic pain control. Discussed that this is a capsaicin patch, stronger than capsaicin cream. Discussed that it is currently approved for diabetic peripheral neuropathy and post-herpetic neuralgia, but that it has also shown benefit in treating other forms of neuropathy. Provided patient with link to site to learn more about the patch: https://www.clark.biz/. Discussed that the patch would be placed in office and benefits usually last 3 months. Discussed that unintended exposure to capsaicin can cause severe irritation of eyes, mucous membranes, respiratory tract, and skin, but that Qutenza is a local treatment and does not have the systemic side effects of other nerve medications. Discussed that there may be pain, itching, erythema, and decreased sensory function associated with the application of Qutenza. Side effects usually subside within 1 week. A cold pack of analgesic medications can help with these side effects. Blood pressure can also be increased due to pain associated with administration of the patch.   D/c oxycodone  -discussed cymbalta - discussed that she has tried before but cannot recall response  5) IBS:  -recommended getting prunes  6) Insomnia: -continue Relaxium  7) Constipation:  -Provided list of following foods that help with constipation and highlighted a few: 1) prunes- contain high amounts of fiber.  2) apples- has a form of dietary fiber called pectin that accelerates stool movement and  increases beneficial gut bacteria 3) pears- in addition to fiber, also high in fructose and sorbitol which have laxative effect 4) figs- contain an enzyme ficin which helps to speed colonic transit 5) kiwis- contain an enzyme actinidin that improves gut motility and reduces constipation 6) oranges- rich in pectin (like apples) 7) grapefruits- contain a flavanol naringenin which  has a laxative effect 8) vegetables- rich in fiber and also great sources of folate, vitamin C, and K 9) artichoke- high in inulin, prebiotic great for the microbiome 10) chicory- increases stool frequency and softness (can be added to coffee) 11) rhubarb- laxative effect 12) sweet potato- high fiber 13) beans, peas, and lentils- contain both soluble and insoluble fiber 14) chia seeds- improves intestinal health and gut flora 15) flaxseeds- laxative effect 16) whole grain rye bread- high in fiber 17) oat bran- high in soluble and insoluble fiber 18) kefir- softens stools -recommended to try at least one of these foods every day.  -drink 6-8 glasses of water per day -walk regularly, especially after meals.   8) Anxiety: -propanolol 20mg  increased to BID -referred to psychiatry in [place] at [name] -increase amitriptyline  to 25mg  HS -increase diazepam  to 2mg  TID q8H prn, discussed potential risks of dementia -discussed following with neuropsychology, but she defers  9) Hypokalemia:  -discussed that K+ is normal on 9/17 BMP  10) Chronic kidney disease: -discussed that her recent Cr is 1.17  11) Hand arthritis: -recommended voltaren gel  12) left sided groin pain:  -discussed that this does not radiate from her back -discussed that this sometimes hurts for days and she feels like she has has a UTI

## 2024-05-08 NOTE — Telephone Encounter (Signed)
 Pts sister called back and she was already taking 25 mg amitriptyline  not 10 mg.

## 2024-05-09 ENCOUNTER — Telehealth: Payer: Self-pay | Admitting: Physical Medicine and Rehabilitation

## 2024-05-09 NOTE — Telephone Encounter (Signed)
 Patient called stating that you spoke about upping her dosage of Amitriptyline . The patient states she was under the impression she was taking 10mg  but the discovered she is actually taking 25mg  and have questions about what the next dosage would be.  She also wants to inform you she used to take 50mg  in the past and it seemed to work the best.

## 2024-05-28 DIAGNOSIS — Z0001 Encounter for general adult medical examination with abnormal findings: Secondary | ICD-10-CM | POA: Diagnosis not present

## 2024-05-28 DIAGNOSIS — R519 Headache, unspecified: Secondary | ICD-10-CM | POA: Diagnosis not present

## 2024-05-28 DIAGNOSIS — G8929 Other chronic pain: Secondary | ICD-10-CM | POA: Diagnosis not present

## 2024-05-28 DIAGNOSIS — I129 Hypertensive chronic kidney disease with stage 1 through stage 4 chronic kidney disease, or unspecified chronic kidney disease: Secondary | ICD-10-CM | POA: Diagnosis not present

## 2024-05-28 DIAGNOSIS — F411 Generalized anxiety disorder: Secondary | ICD-10-CM | POA: Diagnosis not present

## 2024-05-28 DIAGNOSIS — Z0000 Encounter for general adult medical examination without abnormal findings: Secondary | ICD-10-CM | POA: Diagnosis not present

## 2024-05-28 DIAGNOSIS — N1831 Chronic kidney disease, stage 3a: Secondary | ICD-10-CM | POA: Diagnosis not present

## 2024-05-28 DIAGNOSIS — F322 Major depressive disorder, single episode, severe without psychotic features: Secondary | ICD-10-CM | POA: Diagnosis not present

## 2024-05-28 DIAGNOSIS — E785 Hyperlipidemia, unspecified: Secondary | ICD-10-CM | POA: Diagnosis not present

## 2024-05-28 DIAGNOSIS — G4700 Insomnia, unspecified: Secondary | ICD-10-CM | POA: Diagnosis not present

## 2024-05-28 DIAGNOSIS — Z139 Encounter for screening, unspecified: Secondary | ICD-10-CM | POA: Diagnosis not present

## 2024-05-28 DIAGNOSIS — G629 Polyneuropathy, unspecified: Secondary | ICD-10-CM | POA: Diagnosis not present

## 2024-06-09 ENCOUNTER — Other Ambulatory Visit: Payer: Self-pay | Admitting: Physical Medicine and Rehabilitation

## 2024-06-09 ENCOUNTER — Other Ambulatory Visit: Payer: Self-pay | Admitting: Family Medicine

## 2024-06-13 ENCOUNTER — Telehealth: Payer: Self-pay | Admitting: Physical Medicine and Rehabilitation

## 2024-06-13 NOTE — Telephone Encounter (Signed)
 Ps sister called and patient needs diazepam  (refill) sent to [facility] pharmacy

## 2024-07-16 ENCOUNTER — Ambulatory Visit (HOSPITAL_COMMUNITY): Payer: Medicare (Managed Care) | Admitting: Psychiatry

## 2024-08-07 ENCOUNTER — Encounter: Payer: Medicare (Managed Care) | Admitting: Physical Medicine and Rehabilitation
